# Patient Record
Sex: Female | Born: 1955
Health system: Southern US, Community
[De-identification: ages and names within clinical notes are randomized; demographics above are authoritative.]

## PROBLEM LIST (undated history)

## (undated) DIAGNOSIS — E119 Type 2 diabetes mellitus without complications: Secondary | ICD-10-CM

## (undated) DIAGNOSIS — Z9289 Personal history of other medical treatment: Secondary | ICD-10-CM

## (undated) DIAGNOSIS — E785 Hyperlipidemia, unspecified: Secondary | ICD-10-CM

## (undated) HISTORY — DX: Personal history of other medical treatment: Z92.89

## (undated) HISTORY — PX: CATARACT EXTRACTION: SUR2

## (undated) HISTORY — DX: Hyperlipidemia, unspecified: E78.5

---

## 2008-05-10 ENCOUNTER — Emergency Department (HOSPITAL_COMMUNITY): Admission: EM | Admit: 2008-05-10 | Discharge: 2008-05-10 | Payer: Self-pay | Admitting: Emergency Medicine

## 2008-05-27 ENCOUNTER — Encounter: Admission: RE | Admit: 2008-05-27 | Discharge: 2008-06-17 | Payer: Self-pay | Admitting: Orthopaedic Surgery

## 2009-06-28 ENCOUNTER — Encounter: Admission: RE | Admit: 2009-06-28 | Discharge: 2009-07-21 | Payer: Self-pay | Admitting: Family Medicine

## 2010-12-04 ENCOUNTER — Emergency Department (HOSPITAL_COMMUNITY)

## 2010-12-04 ENCOUNTER — Emergency Department (HOSPITAL_COMMUNITY)
Admission: EM | Admit: 2010-12-04 | Discharge: 2010-12-04 | Disposition: A | Discharge status: Home / Self Care | Attending: Emergency Medicine | Admitting: Emergency Medicine

## 2010-12-04 DIAGNOSIS — W108XXA Fall (on) (from) other stairs and steps, initial encounter: Secondary | ICD-10-CM | POA: Insufficient documentation

## 2010-12-04 DIAGNOSIS — M25519 Pain in unspecified shoulder: Secondary | ICD-10-CM | POA: Insufficient documentation

## 2011-03-26 DIAGNOSIS — E785 Hyperlipidemia, unspecified: Secondary | ICD-10-CM | POA: Insufficient documentation

## 2011-04-17 LAB — HM PAP SMEAR

## 2011-04-17 LAB — HM MAMMOGRAPHY

## 2011-05-28 DIAGNOSIS — K5900 Constipation, unspecified: Secondary | ICD-10-CM | POA: Insufficient documentation

## 2011-07-06 DIAGNOSIS — G2581 Restless legs syndrome: Secondary | ICD-10-CM | POA: Insufficient documentation

## 2011-07-06 DIAGNOSIS — M8580 Other specified disorders of bone density and structure, unspecified site: Secondary | ICD-10-CM | POA: Insufficient documentation

## 2011-09-26 DIAGNOSIS — R7989 Other specified abnormal findings of blood chemistry: Secondary | ICD-10-CM | POA: Insufficient documentation

## 2011-10-03 DIAGNOSIS — M069 Rheumatoid arthritis, unspecified: Secondary | ICD-10-CM | POA: Insufficient documentation

## 2011-10-03 DIAGNOSIS — D638 Anemia in other chronic diseases classified elsewhere: Secondary | ICD-10-CM | POA: Insufficient documentation

## 2011-12-21 ENCOUNTER — Telehealth: Payer: Self-pay | Admitting: Internal Medicine

## 2011-12-21 NOTE — Telephone Encounter (Signed)
S/W Pt. In regards to NP appt. On 9/17 @ 1:30 Ref. Lorenza Burton, PA DX Anemia Mailed NP packet

## 2012-01-01 ENCOUNTER — Other Ambulatory Visit (HOSPITAL_BASED_OUTPATIENT_CLINIC_OR_DEPARTMENT_OTHER): Admitting: Lab

## 2012-01-01 ENCOUNTER — Encounter: Payer: Self-pay | Admitting: Internal Medicine

## 2012-01-01 ENCOUNTER — Ambulatory Visit

## 2012-01-01 ENCOUNTER — Ambulatory Visit (HOSPITAL_BASED_OUTPATIENT_CLINIC_OR_DEPARTMENT_OTHER): Admitting: Internal Medicine

## 2012-01-01 VITALS — BP 111/68 | HR 96 | Temp 97.9°F | Resp 20 | Ht 63.5 in | Wt 133.2 lb

## 2012-01-01 DIAGNOSIS — D649 Anemia, unspecified: Secondary | ICD-10-CM

## 2012-01-01 DIAGNOSIS — G2581 Restless legs syndrome: Secondary | ICD-10-CM

## 2012-01-01 DIAGNOSIS — D539 Nutritional anemia, unspecified: Secondary | ICD-10-CM

## 2012-01-01 DIAGNOSIS — M810 Age-related osteoporosis without current pathological fracture: Secondary | ICD-10-CM

## 2012-01-01 LAB — COMPREHENSIVE METABOLIC PANEL (CC13)
AST: 21 U/L (ref 5–34)
Albumin: 4.5 g/dL (ref 3.5–5.0)
Alkaline Phosphatase: 88 U/L (ref 40–150)
Potassium: 4 mEq/L (ref 3.5–5.1)
Sodium: 142 mEq/L (ref 136–145)
Total Bilirubin: 0.7 mg/dL (ref 0.20–1.20)
Total Protein: 7.5 g/dL (ref 6.4–8.3)

## 2012-01-01 LAB — CBC & DIFF AND RETIC
BASO%: 0.7 % (ref 0.0–2.0)
Eosinophils Absolute: 0.1 10*3/uL (ref 0.0–0.5)
HCT: 42 % (ref 34.8–46.6)
LYMPH%: 24.7 % (ref 14.0–49.7)
MCHC: 32.9 g/dL (ref 31.5–36.0)
MCV: 89 fL (ref 79.5–101.0)
MONO#: 0.5 10*3/uL (ref 0.1–0.9)
MONO%: 7 % (ref 0.0–14.0)
NEUT%: 66.3 % (ref 38.4–76.8)
Platelets: 219 10*3/uL (ref 145–400)
RBC: 4.72 10*6/uL (ref 3.70–5.45)
Retic %: 1.19 % (ref 0.70–2.10)
WBC: 7 10*3/uL (ref 3.9–10.3)

## 2012-01-01 NOTE — Progress Notes (Signed)
Checked in new pt with no financial concerns. °

## 2012-01-01 NOTE — Progress Notes (Signed)
[facility] [phone]   [phone]  CONSULT NOTE  REASON FOR CONSULTATION:  56 years old white female with anemia  HPI Donna Moran is a 56 y.o. female was past medical history significant for diabetes mellitus, GERD, dyslipidemia, osteoporosis, restless leg syndrome. The patient was seen by her primary care physician recently and blood work on 09/26/2011 showed hemoglobin of 11.9 and hematocrit 37.0%. The patient was referred to me today for further evaluation and recommendation regarding her anemia. She has a history of hemorrhoids several years ago. The patient had colonoscopy performed in March of 2013 and according to her was normal. She denied having any bleeding issues, bruises or ecchymosis. She denied having any significant fatigue or weakness but has occasional dizzy spells. Her main complaint today was the diabetic peripheral neuropathy and she is currently on tramadol and Neurontin. She has no significant weight loss or night sweats. She has no chest pain, shortness breath, cough or hemoptysis.  She is not currently on any nutritional supplements.  Past medical history: Diabetes mellitus, hemorrhoids, GERD, dyslipidemia, osteoporosis and restless leg syndrome. The patient denied having any history of coronary artery disease or stroke.  Family history:  significant for a mother who died from heart disease and father died in an accident.   Social history: The patient is married and has 2 daughters. She is originally from [place] and has been in the [place] for the last 20 years. She has a history of smoking more than one pack per day for over 34 years but quit 8 years ago. No history of alcohol or drug abuse.   Allergies  Allergen Reactions  . Aspirin Nausea Only    Can take coated aspirin    Current Outpatient Prescriptions  Medication Sig Dispense Refill  . alendronate (FOSAMAX) 70 MG tablet Take 70 mg by mouth every 7  (seven) days. Take with a full glass of water on an empty stomach.      Marland Kitchen atorvastatin (LIPITOR) 20 MG tablet Take 20 mg by mouth daily.      Marland Kitchen gabapentin (NEURONTIN) 300 MG capsule Take 300 mg by mouth 3 (three) times daily. Take 2 capsules tid      . glipiZIDE (GLUCOTROL) 10 MG tablet Take 10 mg by mouth 2 (two) times daily before a meal.      . metFORMIN (GLUCOPHAGE) 850 MG tablet Take 850 mg by mouth 2 (two) times daily with a meal.      . omeprazole (PRILOSEC) 40 MG capsule Take 40 mg by mouth daily.      . traMADol (ULTRAM) 50 MG tablet Take 50 mg by mouth every 6 (six) hours as needed.        Review of Systems  A comprehensive review of systems was negative.  Physical Exam  [id number], healthy, no distress, well nourished and well developed SKIN: skin color, texture, turgor are normal HEAD: Normocephalic, No masses, lesions, tenderness or abnormalities EYES: normal EARS: External ears normal OROPHARYNX:no exudate and no erythema  NECK: supple, no adenopathy LYMPH:  no palpable lymphadenopathy, no hepatosplenomegaly BREAST:not examined LUNGS: clear to auscultation  HEART: regular rate & rhythm, no murmurs and no gallops ABDOMEN:abdomen soft, non-tender, normal bowel sounds and no masses or organomegaly BACK: Back symmetric, no curvature. EXTREMITIES:no edema, no skin discoloration, no clubbing, no cyanosis  NEURO: alert & oriented x 3 with fluent speech, no focal motor/sensory deficits  PERFORMANCE STATUS: ECOG 1  LABORATORY DATA: Lab Results  Component  Value Date   WBC 7.0 01/01/2012   HGB 13.8 01/01/2012   HCT 42.0 01/01/2012   MCV 89.0 01/01/2012   PLT 219 01/01/2012      Chemistry      Component Value Date/Time   NA 142 01/01/2012 1336   K 4.0 01/01/2012 1336   CL 104 01/01/2012 1336   CO2 28 01/01/2012 1336   BUN 12.0 01/01/2012 1336   CREATININE 0.8 01/01/2012 1336      Component Value Date/Time   CALCIUM 9.9 01/01/2012 1336   ALKPHOS 88 01/01/2012 1336   AST 21  01/01/2012 1336   ALT 30 01/01/2012 1336   BILITOT 0.70 01/01/2012 1336       RADIOGRAPHIC STUDIES: No results found.  ASSESSMENT: This is a very pleasant 56 years old white female with history of anemia most likely of iron deficiency which completely resolved at this point and the patient has normal hemoglobin and hematocrit.   PLAN: I discussed the lab result with the patient and recommended for her to continue her routine observation with her primary care physician. I ordered several studies today to rule out any other etiology for her anemia including repeat iron study, ferritin, serum folate, vitamin B12, serum erythropoietin and serum protein electrophoresis. If there is any significant abnormalities in this blood work I will contact the patient with my recommendation, otherwise I don't see any need for this patient to continue routine followup visit with me at this point. I would be happy to see her in the future if needed. I gave the patient the time to ask questions and I answered them completely to her satisfaction.  All questions were answered. The patient knows to call the clinic with any problems, questions or concerns. We can certainly see the patient much sooner if necessary.  Thank you so much for allowing me to participate in the care of Donna Moran. I will continue to follow up the patient with you and assist in her care.  I spent 30 minutes counseling the patient face to face. The total time spent in the appointment was 90minutes.  [name]. 01/01/2012, 2:39 PM

## 2012-01-03 LAB — SPEP & IFE WITH QIG
Alpha-2-Globulin: 10.3 % (ref 7.1–11.8)
Gamma Globulin: 11.5 % (ref 11.1–18.8)
IgA: 291 mg/dL (ref 69–380)
IgG (Immunoglobin G), Serum: 970 mg/dL (ref 690–1700)
IgM, Serum: 128 mg/dL (ref 52–322)
Total Protein, Serum Electrophoresis: 7.3 g/dL (ref 6.0–8.3)

## 2012-01-03 LAB — IRON AND TIBC
%SAT: 38 % (ref 20–55)
Iron: 110 ug/dL (ref 42–145)

## 2012-01-03 LAB — VITAMIN B12: Vitamin B-12: 415 pg/mL (ref 211–911)

## 2012-01-03 LAB — ERYTHROPOIETIN: Erythropoietin: 3.2 m[IU]/mL (ref 2.6–34.0)

## 2012-04-23 ENCOUNTER — Emergency Department (HOSPITAL_COMMUNITY)
Admission: EM | Admit: 2012-04-23 | Discharge: 2012-04-23 | Disposition: A | Discharge status: Home / Self Care | Attending: Emergency Medicine | Admitting: Emergency Medicine

## 2012-04-23 ENCOUNTER — Encounter (HOSPITAL_COMMUNITY): Payer: Self-pay | Admitting: *Deleted

## 2012-04-23 DIAGNOSIS — K029 Dental caries, unspecified: Secondary | ICD-10-CM

## 2012-04-23 DIAGNOSIS — Z79899 Other long term (current) drug therapy: Secondary | ICD-10-CM | POA: Insufficient documentation

## 2012-04-23 DIAGNOSIS — E119 Type 2 diabetes mellitus without complications: Secondary | ICD-10-CM | POA: Insufficient documentation

## 2012-04-23 HISTORY — DX: Type 2 diabetes mellitus without complications: E11.9

## 2012-04-23 MED ORDER — HYDROCODONE-ACETAMINOPHEN 5-325 MG PO TABS: 1.0000 | ORAL_TABLET | Freq: Four times a day (QID) | ORAL | Status: DC | PRN

## 2012-04-23 MED ORDER — PENICILLIN V POTASSIUM 500 MG PO TABS: 500.0000 mg | ORAL_TABLET | Freq: Four times a day (QID) | ORAL | Status: DC

## 2012-04-23 NOTE — ED Notes (Signed)
Pt c/o pain to left side of mouth. States "I have bad teeth I fixed them 30 years ago and now they are falling apart."

## 2012-04-23 NOTE — ED Provider Notes (Signed)
History     CSN: [id number]  Arrival date & time 04/23/12  1209   First MD Initiated Contact with Patient 04/23/12 1217      Chief Complaint  Patient presents with  . Dental Pain    (Consider location/radiation/quality/duration/timing/severity/associated sxs/prior treatment) HPI The patient presents to the emergency department with with dental pain on the left side.  Patient states that her bottom and top teeth all hurt.  Patient states that she has severe dental decay in damage.  Patient denies fever, nausea, vomiting, neck pain, difficulty breathing, difficulty swallowing, or headache.  Patient, states she's seen a dentist in the past.  He says she needs to have her teeth pulled. Past Medical History  Diagnosis Date  . Diabetes mellitus without complication     History reviewed. No pertinent past surgical history.  History reviewed. No pertinent family history.  History  Substance Use Topics  . Smoking status: Not on file  . Smokeless tobacco: Not on file  . Alcohol Use: No    OB History    Grav Para Term Preterm Abortions TAB SAB Ect Mult Living                  Review of Systems All other systems negative except as documented in the HPI. All pertinent positives and negatives as reviewed in the HPI. Allergies  Aspirin  Home Medications   Current Outpatient Rx  Name  Route  Sig  Dispense  Refill  . ALENDRONATE SODIUM 70 MG PO TABS   Oral   Take 70 mg by mouth every 7 (seven) days. Take with a full glass of water on an empty stomach.         . ATORVASTATIN CALCIUM 20 MG PO TABS   Oral   Take 20 mg by mouth daily.         Marland Kitchen GABAPENTIN 300 MG PO CAPS   Oral   Take 300 mg by mouth 3 (three) times daily. Take 2 capsules tid         . GLIPIZIDE 10 MG PO TABS   Oral   Take 10 mg by mouth 2 (two) times daily before a meal.         . METFORMIN HCL 850 MG PO TABS   Oral   Take 850 mg by mouth daily.          Marland Kitchen OMEPRAZOLE 40 MG PO CPDR   Oral  Take 40 mg by mouth daily.         . TRAMADOL HCL 50 MG PO TABS   Oral   Take 50 mg by mouth every 6 (six) hours as needed.           BP 131/81  Pulse 80  Temp 98.6 F (37 C) (Oral)  Resp 18  SpO2 97%  Physical Exam  Nursing note and vitals reviewed. Constitutional: She appears well-developed and well-nourished. No distress.  HENT:  Head: Normocephalic and atraumatic. No trismus in the jaw.  Mouth/Throat: Abnormal dentition. Dental caries present. No dental abscesses or uvula swelling.  Neck: Normal range of motion. Neck supple.  Cardiovascular: Normal rate, regular rhythm and normal heart sounds.   Pulmonary/Chest: Effort normal and breath sounds normal.    ED Course  Procedures (including critical care time)  Patient will be referred to dentistry.  Told to return here as needed  [place], PA-C 04/23/12 1231

## 2012-04-25 NOTE — ED Provider Notes (Signed)
Medical screening examination/treatment/procedure(s) were performed by non-physician practitioner and as supervising physician I was immediately available for consultation/collaboration.    Dot Lanes, MD 04/25/12 1052

## 2012-06-25 DIAGNOSIS — R0681 Apnea, not elsewhere classified: Secondary | ICD-10-CM | POA: Insufficient documentation

## 2012-10-07 ENCOUNTER — Ambulatory Visit (INDEPENDENT_AMBULATORY_CARE_PROVIDER_SITE_OTHER): Admitting: Internal Medicine

## 2012-10-07 ENCOUNTER — Other Ambulatory Visit (INDEPENDENT_AMBULATORY_CARE_PROVIDER_SITE_OTHER)

## 2012-10-07 ENCOUNTER — Encounter: Payer: Self-pay | Admitting: *Deleted

## 2012-10-07 VITALS — BP 112/82 | HR 85 | Temp 98.8°F | Ht 64.0 in | Wt 140.0 lb

## 2012-10-07 DIAGNOSIS — E1110 Type 2 diabetes mellitus with ketoacidosis without coma: Secondary | ICD-10-CM

## 2012-10-07 DIAGNOSIS — Z130 Encounter for screening for diseases of the blood and blood-forming organs and certain disorders involving the immune mechanism: Secondary | ICD-10-CM

## 2012-10-07 DIAGNOSIS — M129 Arthropathy, unspecified: Secondary | ICD-10-CM

## 2012-10-07 DIAGNOSIS — E782 Mixed hyperlipidemia: Secondary | ICD-10-CM | POA: Insufficient documentation

## 2012-10-07 DIAGNOSIS — Z23 Encounter for immunization: Secondary | ICD-10-CM

## 2012-10-07 DIAGNOSIS — Z0000 Encounter for general adult medical examination without abnormal findings: Secondary | ICD-10-CM

## 2012-10-07 DIAGNOSIS — E1310 Other specified diabetes mellitus with ketoacidosis without coma: Secondary | ICD-10-CM

## 2012-10-07 DIAGNOSIS — F411 Generalized anxiety disorder: Secondary | ICD-10-CM

## 2012-10-07 DIAGNOSIS — Z1329 Encounter for screening for other suspected endocrine disorder: Secondary | ICD-10-CM

## 2012-10-07 DIAGNOSIS — E785 Hyperlipidemia, unspecified: Secondary | ICD-10-CM

## 2012-10-07 DIAGNOSIS — D649 Anemia, unspecified: Secondary | ICD-10-CM

## 2012-10-07 DIAGNOSIS — M1990 Unspecified osteoarthritis, unspecified site: Secondary | ICD-10-CM

## 2012-10-07 LAB — BASIC METABOLIC PANEL
BUN: 13 mg/dL (ref 6–23)
CO2: 31 mEq/L (ref 19–32)
Glucose, Bld: 139 mg/dL — ABNORMAL HIGH (ref 70–99)
Potassium: 4.7 mEq/L (ref 3.5–5.1)
Sodium: 141 mEq/L (ref 135–145)

## 2012-10-07 LAB — CBC
MCV: 88.8 fl (ref 78.0–100.0)
Platelets: 203 10*3/uL (ref 150.0–400.0)
RBC: 3.96 Mil/uL (ref 3.87–5.11)
WBC: 8.8 10*3/uL (ref 4.5–10.5)

## 2012-10-07 LAB — HEMOGLOBIN A1C: Hgb A1c MFr Bld: 8.3 % — ABNORMAL HIGH (ref 4.6–6.5)

## 2012-10-07 LAB — LIPID PANEL
HDL: 86.4 mg/dL (ref >39.00)
LDL Cholesterol: 78 mg/dL (ref 0–99)
Total CHOL/HDL Ratio: 2
VLDL: 8.6 mg/dL (ref 0.0–40.0)

## 2012-10-07 LAB — TSH: TSH: 0.67 u[IU]/mL (ref 0.35–5.50)

## 2012-10-07 MED ORDER — TRAMADOL HCL 50 MG PO TABS: 100.0000 mg | ORAL_TABLET | Freq: Four times a day (QID) | ORAL | Status: DC | PRN

## 2012-10-07 MED ORDER — FLUOXETINE HCL 20 MG PO TABS: 20.0000 mg | ORAL_TABLET | Freq: Every day | ORAL | Status: DC

## 2012-10-07 NOTE — Assessment & Plan Note (Signed)
Will check lipid profile today.  

## 2012-10-07 NOTE — Progress Notes (Signed)
HPI  Pt presents to the clinic today to establish care. She is transferring care but she can't remember the name of the practice. She does need refills of her tramadol. Additionally, she has concerns about anxiety and stress. She reports that her husband and children are constantly testing her nerves and she gets very aggravated with them. This happens on a daily basis. This is an every day thing. She has not had any treatment for this in the past.  Flu: never Tetanus: more than 10 years ago LMP: post menopausal Pap smear: 2013 Mammogram: 2013 Pneumovax: unsure Colonoscopy: unsure Eye doctor: as needed Dentist: as needed  Past Medical History  Diagnosis Date  . Diabetes mellitus without complication   . Hyperlipidemia   . History of blood transfusion     Current Outpatient Prescriptions  Medication Sig Dispense Refill  . atorvastatin (LIPITOR) 20 MG tablet Take 20 mg by mouth daily.      Marland Kitchen gabapentin (NEURONTIN) 300 MG capsule Take 600 mg by mouth 3 (three) times daily.       Marland Kitchen glipiZIDE (GLUCOTROL) 10 MG tablet Take 10 mg by mouth 2 (two) times daily before a meal.      . metFORMIN (GLUCOPHAGE) 850 MG tablet Take 850 mg by mouth daily.       Marland Kitchen omeprazole (PRILOSEC) 40 MG capsule Take 40 mg by mouth daily.      . traMADol (ULTRAM) 50 MG tablet Take 100 mg by mouth every 6 (six) hours as needed.       Marland Kitchen alendronate (FOSAMAX) 70 MG tablet Take 70 mg by mouth every 7 (seven) days. Take with a full glass of water on an empty stomach.       No current facility-administered medications for this visit.    Allergies  Allergen Reactions  . Aspirin Nausea Only    Can take coated aspirin    Family History  Problem Relation Age of Onset  . Cancer Neg Hx   . Diabetes Neg Hx   . Stroke Neg Hx     History   Social History  . Marital Status: Married    Spouse Name: N/A    Number of Children: 2  . Years of Education: N/A   Occupational History  . Unemployed    Social History  Main Topics  . Smoking status: Never Smoker   . Smokeless tobacco: Never Used  . Alcohol Use: No  . Drug Use: No  . Sexually Active: Yes   Other Topics Concern  . Not on file   Social History Narrative   Regular exercise-no   Caffeine Use-yes    ROS:  Constitutional: Denies fever, malaise, fatigue, headache or abrupt weight changes.  HEENT: Denies eye pain, eye redness, ear pain, ringing in the ears, wax buildup, runny nose, nasal congestion, bloody nose, or sore throat. Respiratory: Denies difficulty breathing, shortness of breath, cough or sputum production.   Cardiovascular: Denies chest pain, chest tightness, palpitations or swelling in the hands or feet.  Gastrointestinal: Denies abdominal pain, bloating, constipation, diarrhea or blood in the stool.  GU: Denies frequency, urgency, pain with urination, blood in urine, odor or discharge. Musculoskeletal: Pt reports knee pain. Denies decrease in range of motion, difficulty with gait, muscle pain or joint swelling.  Skin: Denies redness, rashes, lesions or ulcercations.  Neurological: Pt reports anxiety. Denies dizziness, difficulty with memory, difficulty with speech or problems with balance and coordination.   No other specific complaints in a complete review of  systems (except as listed in HPI above).  PE:  BP 112/82  Pulse 85  Temp(Src) 98.8 F (37.1 C) (Oral)  Ht 5\' 4"  (1.626 m)  Wt 140 lb (63.504 kg)  BMI 24.02 kg/m2  SpO2 97% Wt Readings from Last 3 Encounters:  10/07/12 140 lb (63.504 kg)  01/01/12 133 lb 3.2 oz (60.419 kg)    General: Appears her stated age, well developed, well nourished in NAD. HEENT: Head: normal shape and size; Eyes: sclera white, no icterus, conjunctiva pink, PERRLA and EOMs intact; Ears: Tm's gray and intact, normal light reflex; Nose: mucosa pink and moist, septum midline; Throat/Mouth: Teeth present, mucosa pink and moist, no lesions or ulcerations noted.  Neck: Normal range of motion.  Neck supple, trachea midline. No massses, lumps or thyromegaly present.  Cardiovascular: Normal rate and rhythm. S1,S2 noted.  No murmur, rubs or gallops noted. No JVD or BLE edema. No carotid bruits noted. Pulmonary/Chest: Normal effort and positive vesicular breath sounds. No respiratory distress. No wheezes, rales or ronchi noted.  Abdomen: Soft and nontender. Normal bowel sounds, no bruits noted. No distention or masses noted. Liver, spleen and kidneys non palpable. Musculoskeletal: Normal range of motion. No signs of joint swelling. No difficulty with gait.  Neurological: Alert and oriented. Cranial nerves II-XII intact. Coordination normal. +DTRs bilaterally. Psychiatric: Mood anxious and affect normal. Behavior is normal. Judgment and thought content normal.      Assessment and Plan:  Preventative Health Maintenance:  Will give tdap today Encouraged pt to see eye doctor and dentist yearly

## 2012-10-07 NOTE — Assessment & Plan Note (Signed)
Refilled tramadol today.

## 2012-10-07 NOTE — Addendum Note (Signed)
Addended by: Legrand Como on: 10/07/2012 04:51 PM   Modules accepted: Orders

## 2012-10-07 NOTE — Assessment & Plan Note (Signed)
Sugars labile Will check A1C today Continue to work on diet and exercise

## 2012-10-07 NOTE — Patient Instructions (Addendum)

## 2012-10-07 NOTE — Assessment & Plan Note (Signed)
Support given Will start Prozac 20mg  daily  RTC in 1 month to f/u

## 2012-10-07 NOTE — Assessment & Plan Note (Signed)
Will check CBC today.  

## 2012-10-08 ENCOUNTER — Other Ambulatory Visit: Payer: Self-pay | Admitting: *Deleted

## 2012-10-08 MED ORDER — METFORMIN HCL 1000 MG PO TABS: 1000.0000 mg | ORAL_TABLET | Freq: Two times a day (BID) | ORAL | Status: DC

## 2012-10-08 NOTE — Telephone Encounter (Signed)
Rx for Metformin 1000mg  BID sent to [facility] to replace 850mg  qd. Pt informed of this change via results in result note.

## 2012-10-29 ENCOUNTER — Telehealth: Payer: Self-pay | Admitting: Internal Medicine

## 2012-10-29 NOTE — Telephone Encounter (Signed)
If she would like we can put her back down to 500 mg BID and  I can add Januvia. How does she feel about this.

## 2012-10-29 NOTE — Telephone Encounter (Signed)
Patient said that her metformin was moved up to 1000mg  twice a day and she said she is having side effects from the increase.  Patient states she is having nausea, stomach craps, and diarrhea.  Please give patient a call back regarding this.  Thanks!

## 2012-10-30 ENCOUNTER — Other Ambulatory Visit: Payer: Self-pay | Admitting: Internal Medicine

## 2012-10-30 MED ORDER — SITAGLIPTIN PHOSPHATE 100 MG PO TABS: 100.0000 mg | ORAL_TABLET | Freq: Every day | ORAL | Status: DC

## 2012-10-30 MED ORDER — METFORMIN HCL 1000 MG PO TABS: 500.0000 mg | ORAL_TABLET | Freq: Two times a day (BID) | ORAL | Status: DC

## 2012-10-30 MED ORDER — GABAPENTIN 300 MG PO CAPS: 600.0000 mg | ORAL_CAPSULE | Freq: Three times a day (TID) | ORAL | Status: DC

## 2012-10-30 NOTE — Telephone Encounter (Signed)
Left message for pt to callback office.  

## 2012-10-30 NOTE — Telephone Encounter (Signed)
RX called in .

## 2012-10-30 NOTE — Telephone Encounter (Signed)
Pt is okay with going back to Metformin 500mg  bid and starting Januvia.

## 2012-11-29 ENCOUNTER — Other Ambulatory Visit: Payer: Self-pay | Admitting: Internal Medicine

## 2013-01-26 ENCOUNTER — Telehealth: Payer: Self-pay | Admitting: Internal Medicine

## 2013-01-26 NOTE — Telephone Encounter (Signed)
[facility]  She will call back with the phone number or location.

## 2013-01-26 NOTE — Telephone Encounter (Signed)
Pt called back. Pharmacy phone number is [phone].  [facility] [address] [facility] [place]

## 2013-01-28 ENCOUNTER — Other Ambulatory Visit: Payer: Self-pay | Admitting: Internal Medicine

## 2013-01-28 NOTE — Telephone Encounter (Signed)
Changed pharmacy to [facility] in [place] per patient...ds,cma

## 2013-01-29 ENCOUNTER — Telehealth: Payer: Self-pay

## 2013-01-29 MED ORDER — TRAMADOL HCL 50 MG PO TABS: ORAL_TABLET | ORAL | Status: DC

## 2013-01-29 NOTE — Telephone Encounter (Signed)
[facility].../lmb 

## 2013-01-29 NOTE — Telephone Encounter (Signed)
Ok done

## 2013-01-29 NOTE — Telephone Encounter (Signed)
Refill request for Tramadol  Can you please advise for [facility].

## 2013-03-04 ENCOUNTER — Other Ambulatory Visit: Payer: Self-pay | Admitting: Internal Medicine

## 2013-03-04 ENCOUNTER — Telehealth: Payer: Self-pay | Admitting: Internal Medicine

## 2013-03-04 DIAGNOSIS — F411 Generalized anxiety disorder: Secondary | ICD-10-CM

## 2013-03-04 MED ORDER — OMEPRAZOLE 40 MG PO CPDR: 40.0000 mg | DELAYED_RELEASE_CAPSULE | Freq: Every day | ORAL | Status: DC

## 2013-03-04 MED ORDER — SITAGLIPTIN PHOSPHATE 100 MG PO TABS: ORAL_TABLET | ORAL | Status: DC

## 2013-03-04 MED ORDER — GLIPIZIDE 10 MG PO TABS: 10.0000 mg | ORAL_TABLET | Freq: Two times a day (BID) | ORAL | Status: DC

## 2013-03-04 MED ORDER — FLUOXETINE HCL 20 MG PO TABS: 20.0000 mg | ORAL_TABLET | Freq: Every day | ORAL | Status: DC

## 2013-03-04 NOTE — Telephone Encounter (Signed)
Pt request refill for: omeprazole, fluoxetine, januvia and glipizide. [facility] [place]

## 2013-03-16 ENCOUNTER — Other Ambulatory Visit: Payer: Self-pay | Admitting: Internal Medicine

## 2013-03-19 NOTE — Telephone Encounter (Signed)
Called [facility] [place].

## 2013-03-19 NOTE — Telephone Encounter (Signed)
Pt called again for refill.  Tramadol.  She has 2 pills left.

## 2013-03-19 NOTE — Telephone Encounter (Signed)
Spoke with Izora Gala at [place] office.  She states the patient has been calling to get this medicine refilled.  Is there anyone here at [facility] that is covering R.Baity's patient's while she is out?   Can this med be refilled/printed for the pt?

## 2013-03-19 NOTE — Telephone Encounter (Signed)
Pt has been trying to get tramadol refilled since 03/16/13 [facility] [place]; Webb Silversmith NP is out of office and pt is out of med today. Pt request med be refilled today; pt does plan to come to [facility] to continue seeing Webb Silversmith NP.Please advise. Pt request cb when called in.

## 2013-03-19 NOTE — Telephone Encounter (Signed)
Donna Moran notified prescription has been called to her pharmacy.

## 2013-04-28 ENCOUNTER — Other Ambulatory Visit: Payer: Self-pay | Admitting: Internal Medicine

## 2013-04-28 NOTE — Telephone Encounter (Signed)
Last filled 10/30/2012 with 5 refills--no upcoming appts--please advise

## 2013-05-02 ENCOUNTER — Other Ambulatory Visit: Payer: Self-pay | Admitting: Internal Medicine

## 2013-05-04 NOTE — Telephone Encounter (Signed)
Pt called back pt had cataract surgery and pt cannot drive for at least one week. Pt needs tramadol for diabetic nerve pain in feet. Pt request refill Tramadol until can come in for appt.Please advise.Pt request cb.

## 2013-05-04 NOTE — Telephone Encounter (Signed)
Rx called into pharmacy and pt is aware. 

## 2013-05-04 NOTE — Telephone Encounter (Signed)
Last filled 03/16/13--please advise

## 2013-05-04 NOTE — Telephone Encounter (Signed)
Ok given 30 day supply

## 2013-05-24 ENCOUNTER — Other Ambulatory Visit: Payer: Self-pay | Admitting: Internal Medicine

## 2013-05-25 ENCOUNTER — Other Ambulatory Visit: Payer: Self-pay | Admitting: Internal Medicine

## 2013-05-26 ENCOUNTER — Other Ambulatory Visit: Payer: Self-pay | Admitting: Internal Medicine

## 2013-06-21 ENCOUNTER — Other Ambulatory Visit: Payer: Self-pay | Admitting: Internal Medicine

## 2013-07-01 ENCOUNTER — Other Ambulatory Visit: Payer: Self-pay | Admitting: Internal Medicine

## 2013-07-01 ENCOUNTER — Ambulatory Visit (INDEPENDENT_AMBULATORY_CARE_PROVIDER_SITE_OTHER): Admitting: Internal Medicine

## 2013-07-01 ENCOUNTER — Encounter: Payer: Self-pay | Admitting: Internal Medicine

## 2013-07-01 VITALS — BP 114/70 | HR 73 | Temp 98.4°F | Wt 135.0 lb

## 2013-07-01 DIAGNOSIS — E785 Hyperlipidemia, unspecified: Secondary | ICD-10-CM

## 2013-07-01 DIAGNOSIS — F411 Generalized anxiety disorder: Secondary | ICD-10-CM

## 2013-07-01 DIAGNOSIS — M129 Arthropathy, unspecified: Secondary | ICD-10-CM

## 2013-07-01 DIAGNOSIS — E119 Type 2 diabetes mellitus without complications: Secondary | ICD-10-CM | POA: Insufficient documentation

## 2013-07-01 DIAGNOSIS — M810 Age-related osteoporosis without current pathological fracture: Secondary | ICD-10-CM

## 2013-07-01 DIAGNOSIS — M1990 Unspecified osteoarthritis, unspecified site: Secondary | ICD-10-CM

## 2013-07-01 MED ORDER — GLIPIZIDE 10 MG PO TABS: 10.0000 mg | ORAL_TABLET | Freq: Two times a day (BID) | ORAL | Status: DC

## 2013-07-01 MED ORDER — FLUOXETINE HCL 20 MG PO TABS: 20.0000 mg | ORAL_TABLET | Freq: Every day | ORAL | Status: DC

## 2013-07-01 MED ORDER — SITAGLIPTIN PHOSPHATE 100 MG PO TABS: 100.0000 mg | ORAL_TABLET | Freq: Every day | ORAL | Status: DC

## 2013-07-01 MED ORDER — OMEPRAZOLE 40 MG PO CPDR: 40.0000 mg | DELAYED_RELEASE_CAPSULE | Freq: Every day | ORAL | Status: DC

## 2013-07-01 MED ORDER — ATORVASTATIN CALCIUM 20 MG PO TABS: 20.0000 mg | ORAL_TABLET | Freq: Every day | ORAL | Status: DC

## 2013-07-01 MED ORDER — METFORMIN HCL 1000 MG PO TABS: 500.0000 mg | ORAL_TABLET | Freq: Two times a day (BID) | ORAL | Status: DC

## 2013-07-01 NOTE — Assessment & Plan Note (Signed)
Well controlled on gabapentin and tramadol, continue at this time

## 2013-07-01 NOTE — Assessment & Plan Note (Signed)
On fosomax in the past Will recheck Vit D today

## 2013-07-01 NOTE — Progress Notes (Signed)
Pre visit review using our clinic review tool, if applicable. No additional management support is needed unless otherwise documented below in the visit note. 

## 2013-07-01 NOTE — Assessment & Plan Note (Signed)
Well controlled on lipitor Will recheck lipid profile today

## 2013-07-01 NOTE — Progress Notes (Signed)
Subjective:    Patient ID: Donna Moran, female    DOB: 12-05-1955, 58 y.o.   MRN: [id number]  HPI  Pt presents to the clinic today for 6 month follow up of chronic medical conditions.  DM2: Last A1C 8.3% 09/2012. Metformin was increased to 1000 mg BID. She is also on glipizide and [place]. Blood sugars range from 47-206.  Arthritis: Takes gabapentin tramadol prn for pain. Well controlled at this time.  Anxiety: She stops the Prozac 1 month ago. Since that time, she has felt jittery and nervous.Marland Kitchen  HLD: Lipid profile 09/2012 reviewed. Cholesterol well controlled on lipitor. 20 mg daily. LFT's normal.  GERD: No issues on prilosec.   She does reques medication refills today.  Review of Systems      Past Medical History  Diagnosis Date  . Diabetes mellitus without complication   . Hyperlipidemia   . History of blood transfusion     Current Outpatient Prescriptions  Medication Sig Dispense Refill  . alendronate (FOSAMAX) 70 MG tablet Take 70 mg by mouth every 7 (seven) days. Take with a full glass of water on an empty stomach.      Marland Kitchen atorvastatin (LIPITOR) 20 MG tablet Take 20 mg by mouth daily.      Marland Kitchen FLUoxetine (PROZAC) 20 MG tablet Take 1 tablet (20 mg total) by mouth daily.  30 tablet  3  . gabapentin (NEURONTIN) 300 MG capsule TAKE 2 CAPSULES (600 MG TOTAL) BY MOUTH 3 (THREE) TIMES DAILY.  180 capsule  3  . glipiZIDE (GLUCOTROL) 10 MG tablet Take 1 tablet (10 mg total) by mouth 2 (two) times daily before a meal.  60 tablet  0  . glucose blood (FREESTYLE LITE) test strip Check blood sugar 1-2 times daily as needed--1 Box  1 each  0  . metFORMIN (GLUCOPHAGE) 1000 MG tablet Take 0.5 tablets (500 mg total) by mouth 2 (two) times daily with a meal.  60 tablet  4  . omeprazole (PRILOSEC) 40 MG capsule Take 1 capsule (40 mg total) by mouth daily.  30 capsule  0  . sitaGLIPtin (JANUVIA) 100 MG tablet Take 1 tablet (100 mg total) by mouth daily.  30 tablet  0   No current  facility-administered medications for this visit.    Allergies  Allergen Reactions  . Aspirin Nausea Only    Can take coated aspirin    Family History  Problem Relation Age of Onset  . Cancer Neg Hx   . Diabetes Neg Hx   . Stroke Neg Hx     History   Social History  . Marital Status: Married    Spouse Name: N/A    Number of Children: 2  . Years of Education: N/A   Occupational History  . Unemployed    Social History Main Topics  . Smoking status: Former Research scientist (life sciences)  . Smokeless tobacco: Never Used  . Alcohol Use: No  . Drug Use: No  . Sexual Activity: Yes   Other Topics Concern  . Not on file   Social History Narrative   Regular exercise-no   Caffeine Use-yes     Constitutional: Denies fever, malaise, fatigue, headache or abrupt weight changes.  Respiratory: Denies difficulty breathing, shortness of breath, cough or sputum production.   Cardiovascular: Denies chest pain, chest tightness, palpitations or swelling in the hands or feet.  Gastrointestinal: Denies abdominal pain, bloating, constipation, diarrhea or blood in the stool.  Musculoskeletal: Denies decrease in range of motion, difficulty with gait,  muscle pain or joint pain and swelling.  Skin: Denies redness, rashes, lesions or ulcercations.  Neurological: Denies dizziness, difficulty with memory, difficulty with speech or problems with balance and coordination.   No other specific complaints in a complete review of systems (except as listed in HPI above).  Objective:   Physical Exam  BP 114/70  Pulse 73  Temp(Src) 98.4 F (36.9 C) (Oral)  Wt 135 lb (61.236 kg)  SpO2 99% Wt Readings from Last 3 Encounters:  07/01/13 135 lb (61.236 kg)  10/07/12 140 lb (63.504 kg)  01/01/12 133 lb 3.2 oz (60.419 kg)    General: Appears her stated age, well developed, well nourished in NAD. Skin: Warm, dry and intact. No rashes, lesions or ulcerations noted. HEENT: Head: normal shape and size; Eyes: sclera white,  no icterus, conjunctiva pink, PERRLA and EOMs intact; Ears: Tm's gray and intact, normal light reflex; Nose: mucosa pink and moist, septum midline; Throat/Mouth: Teeth missing, mucosa pink and moist, no exudate, lesions or ulcerations noted.  Neck: Normal range of motion. Neck supple, trachea midline. No massses, lumps or thyromegaly present.  Cardiovascular: Normal rate and rhythm. S1,S2 noted.  No murmur, rubs or gallops noted. No JVD or BLE edema. No carotid bruits noted. Pulmonary/Chest: Normal effort and positive vesicular breath sounds. No respiratory distress. No wheezes, rales or ronchi noted.  Abdomen: Soft and nontender. Normal bowel sounds, no bruits noted. No distention or masses noted. Liver, spleen and kidneys non palpable. Musculoskeletal: Normal range of motion. No signs of joint swelling. No difficulty with gait.  Neurological: Alert and oriented. Cranial nerves II-XII intact. Coordination normal. +DTRs bilaterally. Psychiatric: Mood and affect normal. Behavior is normal. Judgment and thought content normal.     BMET    Component Value Date/Time   NA 141 10/07/2012 1545   NA 142 01/01/2012 1336   K 4.7 10/07/2012 1545   K 4.0 01/01/2012 1336   CL 103 10/07/2012 1545   CL 104 01/01/2012 1336   CO2 31 10/07/2012 1545   CO2 28 01/01/2012 1336   GLUCOSE 139* 10/07/2012 1545   GLUCOSE 128* 01/01/2012 1336   BUN 13 10/07/2012 1545   BUN 12.0 01/01/2012 1336   CREATININE 0.7 10/07/2012 1545   CREATININE 0.8 01/01/2012 1336   CALCIUM 9.8 10/07/2012 1545   CALCIUM 9.9 01/01/2012 1336    Lipid Panel     Component Value Date/Time   CHOL 173 10/07/2012 1545   TRIG 43.0 10/07/2012 1545   HDL 86.40 10/07/2012 1545   CHOLHDL 2 10/07/2012 1545   VLDL 8.6 10/07/2012 1545   LDLCALC 78 10/07/2012 1545    CBC    Component Value Date/Time   WBC 8.8 10/07/2012 1545   WBC 7.0 01/01/2012 1336   RBC 3.96 10/07/2012 1545   RBC 4.72 01/01/2012 1336   HGB 11.8* 10/07/2012 1545   HGB 13.8 01/01/2012 1336     HCT 35.2* 10/07/2012 1545   HCT 42.0 01/01/2012 1336   PLT 203.0 10/07/2012 1545   PLT 219 01/01/2012 1336   MCV 88.8 10/07/2012 1545   MCV 89.0 01/01/2012 1336   MCH 29.2 01/01/2012 1336   MCHC 33.7 10/07/2012 1545   MCHC 32.9 01/01/2012 1336   RDW 13.0 10/07/2012 1545   RDW 12.2 01/01/2012 1336   LYMPHSABS 1.7 01/01/2012 1336   MONOABS 0.5 01/01/2012 1336   EOSABS 0.1 01/01/2012 1336   BASOSABS 0.1 01/01/2012 1336    Hgb A1C Lab Results  Component Value Date   HGBA1C  8.3* 10/07/2012         Assessment & Plan:

## 2013-07-01 NOTE — Assessment & Plan Note (Signed)
She did abruptly stop the prozac because she ran out of refills Will restart today RX provided

## 2013-07-01 NOTE — Assessment & Plan Note (Signed)
Last A1C 8.3 On metformin, glipizide and januvia Will check A1C and microalbumin today Foot exam today

## 2013-07-01 NOTE — Patient Instructions (Addendum)
Type 2 Diabetes Mellitus, Adult Type 2 diabetes mellitus, often simply referred to as type 2 diabetes, is a long-lasting (chronic) disease. In type 2 diabetes, the pancreas does not make enough insulin (a hormone), the cells are less responsive to the insulin that is made (insulin resistance), or both. Normally, insulin moves sugars from food into the tissue cells. The tissue cells use the sugars for energy. The lack of insulin or the lack of normal response to insulin causes excess sugars to build up in the blood instead of going into the tissue cells. As a result, high blood sugar (hyperglycemia) develops. The effect of high sugar (glucose) levels can cause many complications. Type 2 diabetes was also previously called adult-onset diabetes but it can occur at any age.  RISK FACTORS  A person is predisposed to developing type 2 diabetes if someone in the family has the disease and also has one or more of the following primary risk factors:  Overweight.  An inactive lifestyle.  A history of consistently eating high-calorie foods. Maintaining a normal weight and regular physical activity can reduce the chance of developing type 2 diabetes. SYMPTOMS  A person with type 2 diabetes may not show symptoms initially. The symptoms of type 2 diabetes appear slowly. The symptoms include:  Increased thirst (polydipsia).  Increased urination (polyuria).  Increased urination during the night (nocturia).  Weight loss. This weight loss may be rapid.  Frequent, recurring infections.  Tiredness (fatigue).  Weakness.  Vision changes, such as blurred vision.  Fruity smell to your breath.  Abdominal pain.  Nausea or vomiting.  Cuts or bruises which are slow to heal.  Tingling or numbness in the hands or feet. DIAGNOSIS Type 2 diabetes is frequently not diagnosed until complications of diabetes are present. Type 2 diabetes is diagnosed when symptoms or complications are present and when blood  glucose levels are increased. Your blood glucose level may be checked by one or more of the following blood tests:  A fasting blood glucose test. You will not be allowed to eat for at least 8 hours before a blood sample is taken.  A random blood glucose test. Your blood glucose is checked at any time of the day regardless of when you ate.  A hemoglobin A1c blood glucose test. A hemoglobin A1c test provides information about blood glucose control over the previous 3 months.  An oral glucose tolerance test (OGTT). Your blood glucose is measured after you have not eaten (fasted) for 2 hours and then after you drink a glucose-containing beverage. TREATMENT   You may need to take insulin or diabetes medicine daily to keep blood glucose levels in the desired range.  You will need to match insulin dosing with exercise and healthy food choices. The treatment goal is to maintain the before meal blood sugar (preprandial glucose) level at 70 130 mg/dL. HOME CARE INSTRUCTIONS   Have your hemoglobin A1c level checked twice a year.  Perform daily blood glucose monitoring as directed by your caregiver.  Monitor urine ketones when you are ill and as directed by your caregiver.  Take your diabetes medicine or insulin as directed by your caregiver to maintain your blood glucose levels in the desired range.  Never run out of diabetes medicine or insulin. It is needed every day.  Adjust insulin based on your intake of carbohydrates. Carbohydrates can raise blood glucose levels but need to be included in your diet. Carbohydrates provide vitamins, minerals, and fiber which are an essential part of   a healthy diet. Carbohydrates are found in fruits, vegetables, whole grains, dairy products, legumes, and foods containing added sugars.    Eat healthy foods. Alternate 3 meals with 3 snacks.  Lose weight if overweight.  Carry a medical alert card or wear your medical alert jewelry.  Carry a 15 gram  carbohydrate snack with you at all times to treat low blood glucose (hypoglycemia). Some examples of 15 gram carbohydrate snacks include:  Glucose tablets, 3 or 4   Glucose gel, 15 gram tube  Raisins, 2 tablespoons (24 grams)  Jelly beans, 6  Animal crackers, 8  Regular pop, 4 ounces (120 mL)  Gummy treats, 9  Recognize hypoglycemia. Hypoglycemia occurs with blood glucose levels of 70 mg/dL and below. The risk for hypoglycemia increases when fasting or skipping meals, during or after intense exercise, and during sleep. Hypoglycemia symptoms can include:  Tremors or shakes.  Decreased ability to concentrate.  Sweating.  Increased heart rate.  Headache.  Dry mouth.  Hunger.  Irritability.  Anxiety.  Restless sleep.  Altered speech or coordination.  Confusion.  Treat hypoglycemia promptly. If you are alert and able to safely swallow, follow the 15:15 rule:  Take 15 20 grams of rapid-acting glucose or carbohydrate. Rapid-acting options include glucose gel, glucose tablets, or 4 ounces (120 mL) of fruit juice, regular soda, or low fat milk.  Check your blood glucose level 15 minutes after taking the glucose.  Take 15 20 grams more of glucose if the repeat blood glucose level is still 70 mg/dL or below.  Eat a meal or snack within 1 hour once blood glucose levels return to normal.    Be alert to polyuria and polydipsia which are early signs of hyperglycemia. An early awareness of hyperglycemia allows for prompt treatment. Treat hyperglycemia as directed by your caregiver.  Engage in at least 150 minutes of moderate-intensity physical activity a week, spread over at least 3 days of the week or as directed by your caregiver. In addition, you should engage in resistance exercise at least 2 times a week or as directed by your caregiver.  Adjust your medicine and food intake as needed if you start a new exercise or sport.  Follow your sick day plan at any time you  are unable to eat or drink as usual.  Avoid tobacco use.  Limit alcohol intake to no more than 1 drink per day for nonpregnant women and 2 drinks per day for men. You should drink alcohol only when you are also eating food. Talk with your caregiver whether alcohol is safe for you. Tell your caregiver if you drink alcohol several times a week.  Follow up with your caregiver regularly.  Schedule an eye exam soon after the diagnosis of type 2 diabetes and then annually.  Perform daily skin and foot care. Examine your skin and feet daily for cuts, bruises, redness, nail problems, bleeding, blisters, or sores. A foot exam by a caregiver should be done annually.  Brush your teeth and gums at least twice a day and floss at least once a day. Follow up with your dentist regularly.  Share your diabetes management plan with your workplace or school.  Stay up-to-date with immunizations.  Learn to manage stress.  Obtain ongoing diabetes education and support as needed.  Participate in, or seek rehabilitation as needed to maintain or improve independence and quality of life. Request a physical or occupational therapy referral if you are having foot or hand numbness or difficulties with grooming,   dressing, eating, or physical activity. SEEK MEDICAL CARE IF:   You are unable to eat food or drink fluids for more than 6 hours.  You have nausea and vomiting for more than 6 hours.  Your blood glucose level is over 240 mg/dL.  There is a change in mental status.  You develop an additional serious illness.  You have diarrhea for more than 6 hours.  You have been sick or have had a fever for a couple of days and are not getting better.  You have pain during any physical activity.  SEEK IMMEDIATE MEDICAL CARE IF:  You have difficulty breathing.  You have moderate to large ketone levels. MAKE SURE YOU:  Understand these instructions.  Will watch your condition.  Will get help right away if  you are not doing well or get worse. Document Released: 04/02/2005 Document Revised: 12/26/2011 Document Reviewed: 10/30/2011 Exit[employer] Patient Information 2014 [employer].  

## 2013-07-02 ENCOUNTER — Telehealth: Payer: Self-pay

## 2013-07-02 LAB — COMPREHENSIVE METABOLIC PANEL
ALT: 39 U/L — ABNORMAL HIGH (ref 0–35)
AST: 29 U/L (ref 0–37)
Albumin: 4.4 g/dL (ref 3.5–5.2)
Alkaline Phosphatase: 53 U/L (ref 39–117)
BUN: 12 mg/dL (ref 6–23)
CHLORIDE: 104 meq/L (ref 96–112)
CO2: 27 mEq/L (ref 19–32)
CREATININE: 0.7 mg/dL (ref 0.4–1.2)
Calcium: 9.4 mg/dL (ref 8.4–10.5)
GFR: 86.46 mL/min (ref >60.00)
Glucose, Bld: 140 mg/dL — ABNORMAL HIGH (ref 70–99)
Potassium: 4.2 mEq/L (ref 3.5–5.1)
Sodium: 142 mEq/L (ref 135–145)
Total Bilirubin: 0.5 mg/dL (ref 0.3–1.2)
Total Protein: 7.5 g/dL (ref 6.0–8.3)

## 2013-07-02 LAB — CBC
HCT: 38.7 % (ref 36.0–46.0)
HEMOGLOBIN: 12.8 g/dL (ref 12.0–15.0)
MCHC: 33.2 g/dL (ref 30.0–36.0)
MCV: 89.7 fl (ref 78.0–100.0)
PLATELETS: 207 10*3/uL (ref 150.0–400.0)
RBC: 4.31 Mil/uL (ref 3.87–5.11)
RDW: 12.9 % (ref 11.5–14.6)
WBC: 7.4 10*3/uL (ref 4.5–10.5)

## 2013-07-02 LAB — LIPID PANEL
CHOL/HDL RATIO: 3
Cholesterol: 215 mg/dL — ABNORMAL HIGH (ref 0–200)
HDL: 85.1 mg/dL (ref >39.00)
LDL Cholesterol: 116 mg/dL — ABNORMAL HIGH (ref 0–99)
TRIGLYCERIDES: 69 mg/dL (ref 0.0–149.0)
VLDL: 13.8 mg/dL (ref 0.0–40.0)

## 2013-07-02 LAB — VITAMIN D 25 HYDROXY (VIT D DEFICIENCY, FRACTURES): Vit D, 25-Hydroxy: 24 ng/mL — ABNORMAL LOW (ref 30–89)

## 2013-07-02 LAB — HEMOGLOBIN A1C: Hgb A1c MFr Bld: 7.4 % — ABNORMAL HIGH (ref 4.6–6.5)

## 2013-07-02 MED ORDER — ALENDRONATE SODIUM 70 MG PO TABS: 70.0000 mg | ORAL_TABLET | ORAL | Status: DC

## 2013-07-02 NOTE — Telephone Encounter (Signed)
Pt said she was seen on 07/01/13 and when picked up med from pharmacy, Fosamax and Tramadol was not in refills. Pt is out of Tramadol. Pt request new rx Tramadol 50 mg  Taking 2 tabs q6h prn for pain #240 to [facility].Pt also request refill fosamax. Pt request cb when med called to pharmacy.Please advise.

## 2013-07-03 NOTE — Telephone Encounter (Signed)
Please phone in tramadol On historical mes

## 2013-07-03 NOTE — Telephone Encounter (Signed)
Rx called into pharmacy as requested, pt is aware

## 2013-07-03 NOTE — Telephone Encounter (Signed)
Pt called back and is out of Tramadol and request refill Tramadol to [facility]; pt said was taken off med list and does not understand why.Pt request cb today.

## 2013-07-09 ENCOUNTER — Telehealth: Payer: Self-pay

## 2013-07-09 NOTE — Telephone Encounter (Signed)
Relevant patient education mailed to patient.  

## 2013-08-26 ENCOUNTER — Other Ambulatory Visit: Payer: Self-pay | Admitting: Family Medicine

## 2013-08-26 MED ORDER — TRAMADOL HCL 50 MG PO TABS: 50.0000 mg | ORAL_TABLET | Freq: Three times a day (TID) | ORAL | Status: DC | PRN

## 2013-08-26 NOTE — Telephone Encounter (Signed)
Pt called very frustrated because she said that she requested RX on [facility] with [facility] and they told her they have not received a response from our office.  I informed her that we had not received request and that I would send to Webb Silversmith, NP for approval.  She is out of medication.  She requests call back once RX has been sent.  Please send response to Threasa Beards (I am out of office this afternoon).

## 2013-08-26 NOTE — Telephone Encounter (Signed)
Rx called into pharmacy Called pt to notify her but there was no answer and recording stated the VM was full

## 2013-08-26 NOTE — Telephone Encounter (Signed)
Ok to refill tramadol 

## 2013-08-29 ENCOUNTER — Other Ambulatory Visit: Payer: Self-pay | Admitting: Internal Medicine

## 2013-10-13 ENCOUNTER — Other Ambulatory Visit: Payer: Self-pay

## 2013-10-13 NOTE — Telephone Encounter (Signed)
Last filled 08/26/13--please advise

## 2013-10-14 MED ORDER — TRAMADOL HCL 50 MG PO TABS: 50.0000 mg | ORAL_TABLET | Freq: Three times a day (TID) | ORAL | Status: DC | PRN

## 2013-10-14 NOTE — Telephone Encounter (Signed)
Ok to phone in tramadol 

## 2013-10-14 NOTE — Telephone Encounter (Signed)
Rx called in to pharmacy. 

## 2013-11-22 ENCOUNTER — Other Ambulatory Visit: Payer: Self-pay | Admitting: Internal Medicine

## 2013-11-23 NOTE — Telephone Encounter (Signed)
Last appt was a med refill f/u. Has f/u appt scheduled in 9/15.

## 2013-11-27 ENCOUNTER — Other Ambulatory Visit: Payer: Self-pay | Admitting: *Deleted

## 2013-11-27 MED ORDER — TRAMADOL HCL 50 MG PO TABS: 50.0000 mg | ORAL_TABLET | Freq: Three times a day (TID) | ORAL | Status: DC | PRN

## 2013-11-27 NOTE — Telephone Encounter (Signed)
Ok to phone in tramadol 

## 2013-11-27 NOTE — Telephone Encounter (Signed)
Medication phoned to pharmacy.  

## 2013-11-27 NOTE — Telephone Encounter (Signed)
Faxed refill request. Last Filled:   240 tablet 0 RF on  10/14/13.  Please advise.

## 2013-12-15 ENCOUNTER — Other Ambulatory Visit: Payer: Self-pay | Admitting: *Deleted

## 2013-12-15 MED ORDER — ALENDRONATE SODIUM 70 MG PO TABS: 70.0000 mg | ORAL_TABLET | ORAL | Status: DC

## 2013-12-22 ENCOUNTER — Other Ambulatory Visit: Payer: Self-pay | Admitting: *Deleted

## 2013-12-22 DIAGNOSIS — F411 Generalized anxiety disorder: Secondary | ICD-10-CM

## 2013-12-23 MED ORDER — FLUOXETINE HCL 20 MG PO TABS: 20.0000 mg | ORAL_TABLET | Freq: Every day | ORAL | Status: DC

## 2013-12-23 MED ORDER — GLIPIZIDE 10 MG PO TABS: 10.0000 mg | ORAL_TABLET | Freq: Two times a day (BID) | ORAL | Status: DC

## 2013-12-23 MED ORDER — SITAGLIPTIN PHOSPHATE 100 MG PO TABS: 100.0000 mg | ORAL_TABLET | Freq: Every day | ORAL | Status: DC

## 2013-12-23 MED ORDER — OMEPRAZOLE 40 MG PO CPDR: 40.0000 mg | DELAYED_RELEASE_CAPSULE | Freq: Every day | ORAL | Status: DC

## 2013-12-23 MED ORDER — ATORVASTATIN CALCIUM 20 MG PO TABS: 20.0000 mg | ORAL_TABLET | Freq: Every day | ORAL | Status: DC

## 2014-01-01 ENCOUNTER — Encounter: Payer: Self-pay | Admitting: Internal Medicine

## 2014-01-01 ENCOUNTER — Ambulatory Visit (INDEPENDENT_AMBULATORY_CARE_PROVIDER_SITE_OTHER): Admitting: Internal Medicine

## 2014-01-01 ENCOUNTER — Encounter (INDEPENDENT_AMBULATORY_CARE_PROVIDER_SITE_OTHER): Payer: Self-pay

## 2014-01-01 VITALS — BP 134/66 | HR 82 | Temp 98.2°F | Wt 144.0 lb

## 2014-01-01 DIAGNOSIS — E1165 Type 2 diabetes mellitus with hyperglycemia: Secondary | ICD-10-CM

## 2014-01-01 DIAGNOSIS — K219 Gastro-esophageal reflux disease without esophagitis: Secondary | ICD-10-CM

## 2014-01-01 DIAGNOSIS — E119 Type 2 diabetes mellitus without complications: Secondary | ICD-10-CM

## 2014-01-01 DIAGNOSIS — M129 Arthropathy, unspecified: Secondary | ICD-10-CM

## 2014-01-01 DIAGNOSIS — M810 Age-related osteoporosis without current pathological fracture: Secondary | ICD-10-CM

## 2014-01-01 DIAGNOSIS — M1990 Unspecified osteoarthritis, unspecified site: Secondary | ICD-10-CM

## 2014-01-01 DIAGNOSIS — F411 Generalized anxiety disorder: Secondary | ICD-10-CM

## 2014-01-01 DIAGNOSIS — E785 Hyperlipidemia, unspecified: Secondary | ICD-10-CM

## 2014-01-01 DIAGNOSIS — Z23 Encounter for immunization: Secondary | ICD-10-CM

## 2014-01-01 LAB — COMPREHENSIVE METABOLIC PANEL
ALT: 33 U/L (ref 0–35)
AST: 27 U/L (ref 0–37)
Albumin: 4.3 g/dL (ref 3.5–5.2)
Alkaline Phosphatase: 61 U/L (ref 39–117)
BUN: 17 mg/dL (ref 6–23)
CALCIUM: 9.8 mg/dL (ref 8.4–10.5)
CO2: 29 mEq/L (ref 19–32)
CREATININE: 0.8 mg/dL (ref 0.4–1.2)
Chloride: 104 mEq/L (ref 96–112)
GFR: 79.96 mL/min (ref >60.00)
GLUCOSE: 93 mg/dL (ref 70–99)
Potassium: 4.6 mEq/L (ref 3.5–5.1)
Sodium: 140 mEq/L (ref 135–145)
Total Bilirubin: 0.7 mg/dL (ref 0.2–1.2)
Total Protein: 7.3 g/dL (ref 6.0–8.3)

## 2014-01-01 LAB — LIPID PANEL
CHOL/HDL RATIO: 2
CHOLESTEROL: 154 mg/dL (ref 0–200)
HDL: 62.6 mg/dL (ref >39.00)
LDL Cholesterol: 81 mg/dL (ref 0–99)
NonHDL: 91.4
TRIGLYCERIDES: 54 mg/dL (ref 0.0–149.0)
VLDL: 10.8 mg/dL (ref 0.0–40.0)

## 2014-01-01 LAB — CBC
HCT: 37.7 % (ref 36.0–46.0)
HEMOGLOBIN: 12.7 g/dL (ref 12.0–15.0)
MCHC: 33.6 g/dL (ref 30.0–36.0)
MCV: 88.9 fl (ref 78.0–100.0)
PLATELETS: 178 10*3/uL (ref 150.0–400.0)
RBC: 4.24 Mil/uL (ref 3.87–5.11)
RDW: 12.9 % (ref 11.5–15.5)
WBC: 9.1 10*3/uL (ref 4.0–10.5)

## 2014-01-01 LAB — MICROALBUMIN / CREATININE URINE RATIO
CREATININE, U: 43.5 mg/dL
MICROALB UR: 9.5 mg/dL — AB (ref 0.0–1.9)
MICROALB/CREAT RATIO: 21.8 mg/g (ref 0.0–30.0)

## 2014-01-01 LAB — HEMOGLOBIN A1C: Hgb A1c MFr Bld: 8.1 % — ABNORMAL HIGH (ref 4.6–6.5)

## 2014-01-01 MED ORDER — TRAMADOL HCL 50 MG PO TABS: 100.0000 mg | ORAL_TABLET | Freq: Three times a day (TID) | ORAL | Status: DC | PRN

## 2014-01-01 NOTE — Patient Instructions (Addendum)

## 2014-01-01 NOTE — Assessment & Plan Note (Signed)
Will order bone scan Continue fosamax

## 2014-01-01 NOTE — Assessment & Plan Note (Signed)
On a low fat diet Will repeat lipid profile and cmet today Will refill/adjust med if needed

## 2014-01-01 NOTE — Addendum Note (Signed)
Addended by: Lurlean Nanny on: 01/01/2014 02:58 PM   Modules accepted: Orders

## 2014-01-01 NOTE — Assessment & Plan Note (Signed)
Mood controlled on prozac

## 2014-01-01 NOTE — Assessment & Plan Note (Addendum)
Does reports some hyperglycemia On metformin, glipizide and januvia Will check A1C and microalbumin today Encouraged her to continue to consume low carb diet Flu and pneumovax

## 2014-01-01 NOTE — Assessment & Plan Note (Signed)
Not controlled on omeprazole Will switch to protonix

## 2014-01-01 NOTE — Progress Notes (Signed)
Subjective:    Patient ID: Donna Moran, female    DOB: June 29, 1955, 58 y.o.   MRN: [id number]  HPI  Pt presents to the clinic today for 6 month follow up of chronic conditions.  GAD: On prozac daily. Mood well controlled.  Arthritis: doing okay on tramadol. Needs new RX today  HLD: Takes lipitor daily. Denies myalgias. She is tried to consume a low fat diet.  DM2: Last A1C 7.4% on metformin, glipizide and januvia. Her sugars range was 74-294. She is consuming a low carb diet. Her last exam was 04/2013. She has never had flu shot and pneumonia shot.  Osteoporosis: Last bone scan was more than 2 years ago. On fosamax.  GERD: not well controlled on omeprazole. Would like to try something else.  Review of Systems      Past Medical History  Diagnosis Date  . Diabetes mellitus without complication   . Hyperlipidemia   . History of blood transfusion     Current Outpatient Prescriptions  Medication Sig Dispense Refill  . alendronate (FOSAMAX) 70 MG tablet Take 1 tablet (70 mg total) by mouth every 7 (seven) days. Take with a full glass of water on an empty stomach.  12 tablet  1  . atorvastatin (LIPITOR) 20 MG tablet Take 1 tablet (20 mg total) by mouth daily.  30 tablet  2  . FLUoxetine (PROZAC) 20 MG tablet Take 1 tablet (20 mg total) by mouth daily.  30 tablet  2  . gabapentin (NEURONTIN) 300 MG capsule TAKE 2 CAPSULES 3 TIMES A DAY  180 capsule  3  . glipiZIDE (GLUCOTROL) 10 MG tablet Take 1 tablet (10 mg total) by mouth 2 (two) times daily before a meal.  60 tablet  2  . glucose blood (FREESTYLE LITE) test strip 1 each by Other route 2 (two) times daily as needed for other.  100 each  2  . metFORMIN (GLUCOPHAGE) 1000 MG tablet Take 0.5 tablets (500 mg total) by mouth 2 (two) times daily with a meal.  60 tablet  5  . omeprazole (PRILOSEC) 40 MG capsule Take 1 capsule (40 mg total) by mouth daily.  30 capsule  5  . sitaGLIPtin (JANUVIA) 100 MG tablet Take 1 tablet (100 mg  total) by mouth daily.  30 tablet  2  . traMADol (ULTRAM) 50 MG tablet Take 1 tablet (50 mg total) by mouth every 8 (eight) hours as needed.  240 tablet  0   No current facility-administered medications for this visit.    Allergies  Allergen Reactions  . Aspirin Nausea Only    Can take coated aspirin    Family History  Problem Relation Age of Onset  . Cancer Neg Hx   . Diabetes Neg Hx   . Stroke Neg Hx     History   Social History  . Marital Status: Married    Spouse Name: N/A    Number of Children: 2  . Years of Education: N/A   Occupational History  . Unemployed    Social History Main Topics  . Smoking status: Former Research scientist (life sciences)  . Smokeless tobacco: Never Used  . Alcohol Use: No  . Drug Use: No  . Sexual Activity: Yes   Other Topics Concern  . Not on file   Social History Narrative   Regular exercise-no   Caffeine Use-yes     Constitutional: Denies fever, malaise, fatigue, headache or abrupt weight changes.  Respiratory: Denies difficulty breathing, shortness of breath, cough or  sputum production.   Cardiovascular: Denies chest pain, chest tightness, palpitations or swelling in the hands or feet.  Gastrointestinal: Pt reports reflux. Denies abdominal pain, bloating, constipation, diarrhea or blood in the stool.  Musculoskeletal: Denies decrease in range of motion, difficulty with gait, muscle pain or joint swelling.  Skin: Denies redness, rashes, lesions or ulcercations.  Neurological: Denies dizziness, difficulty with memory, difficulty with speech or problems with balance and coordination.   No other specific complaints in a complete review of systems (except as listed in HPI above).  Objective:   Physical Exam  BP 134/66  Pulse 82  Temp(Src) 98.2 F (36.8 C) (Oral)  Wt 144 lb (65.318 kg)  SpO2 98% Wt Readings from Last 3 Encounters:  01/01/14 144 lb (65.318 kg)  07/01/13 135 lb (61.236 kg)  10/07/12 140 lb (63.504 kg)    General: Appears her  stated age, well developed, well nourished in NAD. Skin: Warm, dry and intact. No rashes, lesions or ulcerations noted. Cardiovascular: Normal rate and rhythm. S1,S2 noted.  No murmur, rubs or gallops noted.  Pulmonary/Chest: Normal effort and positive vesicular breath sounds. No respiratory distress. No wheezes, rales or ronchi noted.  Abdomen: Soft and nontender. Normal bowel sounds, no bruits noted. No distention or masses noted. Liver, spleen and kidneys non palpable. Neurological: Alert and oriented.  Psychiatric: Mood and affect normal. Behavior is normal. Judgment and thought content normal.     BMET    Component Value Date/Time   NA 142 07/01/2013 1537   NA 142 01/01/2012 1336   K 4.2 07/01/2013 1537   K 4.0 01/01/2012 1336   CL 104 07/01/2013 1537   CL 104 01/01/2012 1336   CO2 27 07/01/2013 1537   CO2 28 01/01/2012 1336   GLUCOSE 140* 07/01/2013 1537   GLUCOSE 128* 01/01/2012 1336   BUN 12 07/01/2013 1537   BUN 12.0 01/01/2012 1336   CREATININE 0.7 07/01/2013 1537   CREATININE 0.8 01/01/2012 1336   CALCIUM 9.4 07/01/2013 1537   CALCIUM 9.9 01/01/2012 1336    Lipid Panel     Component Value Date/Time   CHOL 215* 07/01/2013 1537   TRIG 69.0 07/01/2013 1537   HDL 85.10 07/01/2013 1537   CHOLHDL 3 07/01/2013 1537   VLDL 13.8 07/01/2013 1537   LDLCALC 116* 07/01/2013 1537    CBC    Component Value Date/Time   WBC 7.4 07/01/2013 1537   WBC 7.0 01/01/2012 1336   RBC 4.31 07/01/2013 1537   RBC 4.72 01/01/2012 1336   HGB 12.8 07/01/2013 1537   HGB 13.8 01/01/2012 1336   HCT 38.7 07/01/2013 1537   HCT 42.0 01/01/2012 1336   PLT 207.0 07/01/2013 1537   PLT 219 01/01/2012 1336   MCV 89.7 07/01/2013 1537   MCV 89.0 01/01/2012 1336   MCH 29.2 01/01/2012 1336   MCHC 33.2 07/01/2013 1537   MCHC 32.9 01/01/2012 1336   RDW 12.9 07/01/2013 1537   RDW 12.2 01/01/2012 1336   LYMPHSABS 1.7 01/01/2012 1336   MONOABS 0.5 01/01/2012 1336   EOSABS 0.1 01/01/2012 1336   BASOSABS 0.1 01/01/2012 1336    Hgb  A1C Lab Results  Component Value Date   HGBA1C 7.4* 07/01/2013         Assessment & Plan:

## 2014-01-01 NOTE — Progress Notes (Signed)
Pre visit review using our clinic review tool, if applicable. No additional management support is needed unless otherwise documented below in the visit note. 

## 2014-01-01 NOTE — Assessment & Plan Note (Signed)
Well controlled on tramadol RX provided today

## 2014-01-04 MED ORDER — METFORMIN HCL 1000 MG PO TABS: 1000.0000 mg | ORAL_TABLET | Freq: Two times a day (BID) | ORAL | Status: DC

## 2014-01-04 NOTE — Addendum Note (Signed)
Addended by: Lurlean Nanny on: 01/04/2014 11:09 AM   Modules accepted: Orders

## 2014-01-07 ENCOUNTER — Other Ambulatory Visit: Payer: Self-pay | Admitting: Internal Medicine

## 2014-01-18 ENCOUNTER — Other Ambulatory Visit: Payer: Self-pay

## 2014-01-18 DIAGNOSIS — F411 Generalized anxiety disorder: Secondary | ICD-10-CM

## 2014-01-18 DIAGNOSIS — E119 Type 2 diabetes mellitus without complications: Secondary | ICD-10-CM

## 2014-01-18 MED ORDER — GABAPENTIN 300 MG PO CAPS: 600.0000 mg | ORAL_CAPSULE | Freq: Three times a day (TID) | ORAL | Status: DC | PRN

## 2014-01-18 MED ORDER — METFORMIN HCL 1000 MG PO TABS: 1000.0000 mg | ORAL_TABLET | Freq: Two times a day (BID) | ORAL | Status: DC

## 2014-01-18 MED ORDER — OMEPRAZOLE 40 MG PO CPDR: 40.0000 mg | DELAYED_RELEASE_CAPSULE | Freq: Every day | ORAL | Status: DC

## 2014-01-18 MED ORDER — ATORVASTATIN CALCIUM 20 MG PO TABS: 20.0000 mg | ORAL_TABLET | Freq: Every day | ORAL | Status: DC

## 2014-01-18 MED ORDER — SITAGLIPTIN PHOSPHATE 100 MG PO TABS: 100.0000 mg | ORAL_TABLET | Freq: Every day | ORAL | Status: DC

## 2014-01-18 MED ORDER — FLUOXETINE HCL 20 MG PO TABS: 20.0000 mg | ORAL_TABLET | Freq: Every day | ORAL | Status: DC

## 2014-01-18 MED ORDER — GLIPIZIDE 10 MG PO TABS: 10.0000 mg | ORAL_TABLET | Freq: Two times a day (BID) | ORAL | Status: DC

## 2014-01-18 NOTE — Telephone Encounter (Signed)
Please advise as pt is now requesting medication sent to mail order- i was sending 1 #90 day supply with no refill to ensure she f/u but need authorization for Prozac--please advise if you authorize refill as pt had refills sent 9/15 with 2 refills to local pharmacy

## 2014-04-01 ENCOUNTER — Encounter: Payer: Self-pay | Admitting: Internal Medicine

## 2014-04-05 ENCOUNTER — Encounter: Payer: Self-pay | Admitting: Internal Medicine

## 2014-04-05 ENCOUNTER — Ambulatory Visit (INDEPENDENT_AMBULATORY_CARE_PROVIDER_SITE_OTHER): Admitting: Internal Medicine

## 2014-04-05 ENCOUNTER — Other Ambulatory Visit: Payer: Self-pay

## 2014-04-05 VITALS — BP 124/76 | HR 64 | Temp 98.3°F | Wt 144.0 lb

## 2014-04-05 DIAGNOSIS — E119 Type 2 diabetes mellitus without complications: Secondary | ICD-10-CM

## 2014-04-05 DIAGNOSIS — M1990 Unspecified osteoarthritis, unspecified site: Secondary | ICD-10-CM

## 2014-04-05 DIAGNOSIS — E1142 Type 2 diabetes mellitus with diabetic polyneuropathy: Secondary | ICD-10-CM

## 2014-04-05 DIAGNOSIS — K219 Gastro-esophageal reflux disease without esophagitis: Secondary | ICD-10-CM

## 2014-04-05 DIAGNOSIS — E785 Hyperlipidemia, unspecified: Secondary | ICD-10-CM

## 2014-04-05 DIAGNOSIS — F411 Generalized anxiety disorder: Secondary | ICD-10-CM

## 2014-04-05 DIAGNOSIS — R229 Localized swelling, mass and lump, unspecified: Secondary | ICD-10-CM

## 2014-04-05 LAB — HEMOGLOBIN A1C: Hgb A1c MFr Bld: 7.7 % — ABNORMAL HIGH (ref 4.6–6.5)

## 2014-04-05 MED ORDER — FLUOXETINE HCL 20 MG PO TABS: 20.0000 mg | ORAL_TABLET | Freq: Every day | ORAL | Status: DC

## 2014-04-05 MED ORDER — GLUCOSE BLOOD VI STRP: ORAL_STRIP | Status: DC

## 2014-04-05 MED ORDER — METFORMIN HCL 1000 MG PO TABS: 1000.0000 mg | ORAL_TABLET | Freq: Two times a day (BID) | ORAL | Status: DC

## 2014-04-05 MED ORDER — GLIPIZIDE 10 MG PO TABS: 10.0000 mg | ORAL_TABLET | Freq: Two times a day (BID) | ORAL | Status: DC

## 2014-04-05 MED ORDER — TRAMADOL HCL 50 MG PO TABS: 100.0000 mg | ORAL_TABLET | Freq: Three times a day (TID) | ORAL | Status: DC | PRN

## 2014-04-05 MED ORDER — SITAGLIPTIN PHOSPHATE 100 MG PO TABS: 100.0000 mg | ORAL_TABLET | Freq: Every day | ORAL | Status: DC

## 2014-04-05 MED ORDER — OMEPRAZOLE 40 MG PO CPDR: 40.0000 mg | DELAYED_RELEASE_CAPSULE | Freq: Every day | ORAL | Status: DC

## 2014-04-05 MED ORDER — ATORVASTATIN CALCIUM 20 MG PO TABS: 20.0000 mg | ORAL_TABLET | Freq: Every day | ORAL | Status: DC

## 2014-04-05 NOTE — Assessment & Plan Note (Signed)
Denies myalgias on lipitor RX provided today

## 2014-04-05 NOTE — Patient Instructions (Signed)

## 2014-04-05 NOTE — Assessment & Plan Note (Signed)
Stable on prozac Rx provided today

## 2014-04-05 NOTE — Assessment & Plan Note (Signed)
Well controlled on Tramadol RX given today

## 2014-04-05 NOTE — Assessment & Plan Note (Signed)
Will repeat A1C today Foot exam today

## 2014-04-05 NOTE — Progress Notes (Signed)
Subjective:    Patient ID: Donna Moran, female    DOB: [birth year], 58 y.o.   MRN: [id number]  HPI  Pt presents to the clinic today for her 3 month follow up of DM 2. At her last visit, her A1C was 8.1 %. Her Metformin was increased at that time to 1000 mg BID. She is also on Glipizide 10 mg BID and Januvia 100 mg daily. Microalbumin was done 12/2013. Her blood sugars are still running high in the mid 200's. Her last eye exam 12/2013. Her flu and pneumonia shot were given 12/2013. She continues to have some paresthesias but well controlled on Neurontin.   [facility]  Arthritis: well controlled on tramadol  GAD: well controlled on prozac  GERD: Fair control on prilosec. She does occasionally have breakthrough symptoms with certain foods.   HLD: Denies myalgias on Lipitor.   She does have some concerns about this hematoma on her right shoulder. A 12 pack fell on her shoulder many years ago. She has had this hematoma that will not reabsorb. She would like surgical evaluation to see if it can be removed.  Review of Systems      Past Medical History  Diagnosis Date  . Diabetes mellitus without complication   . Hyperlipidemia   . History of blood transfusion     Current Outpatient Prescriptions  Medication Sig Dispense Refill  . alendronate (FOSAMAX) 70 MG tablet Take 1 tablet (70 mg total) by mouth every 7 (seven) days. Take with a full glass of water on an empty stomach. 12 tablet 1  . atorvastatin (LIPITOR) 20 MG tablet Take 1 tablet (20 mg total) by mouth daily. 90 tablet 0  . FLUoxetine (PROZAC) 20 MG tablet Take 1 tablet (20 mg total) by mouth daily. 90 tablet 0  . FREESTYLE LITE test strip TEST TWICE DAILY AS NEEDED 100 each 5  . gabapentin (NEURONTIN) 300 MG capsule Take 2 capsules (600 mg total) by mouth 3 (three) times daily as needed. 540 capsule 0  . glipiZIDE (GLUCOTROL) 10 MG tablet Take 1 tablet (10 mg total) by mouth 2  (two) times daily before a meal. 180 tablet 0  . metFORMIN (GLUCOPHAGE) 1000 MG tablet Take 1 tablet (1,000 mg total) by mouth 2 (two) times daily with a meal. 180 tablet 0  . omeprazole (PRILOSEC) 40 MG capsule Take 1 capsule (40 mg total) by mouth daily. 90 capsule 0  . sitaGLIPtin (JANUVIA) 100 MG tablet Take 1 tablet (100 mg total) by mouth daily. 90 tablet 0  . traMADol (ULTRAM) 50 MG tablet Take 2 tablets (100 mg total) by mouth every 8 (eight) hours as needed. 240 tablet 0   No current facility-administered medications for this visit.    Allergies  Allergen Reactions  . Aspirin Nausea Only    Can take coated aspirin    Family History  Problem Relation Age of Onset  . Cancer Neg Hx   . Diabetes Neg Hx   . Stroke Neg Hx     History   Social History  . Marital Status: Married    Spouse Name: N/A    Number of Children: 2  . Years of Education: N/A   Occupational History  . Unemployed    Social History Main Topics  . Smoking status: Former Research scientist (life sciences)  . Smokeless tobacco: Never Used  . Alcohol Use: No  . Drug Use: No  . Sexual Activity: Yes   Other  Topics Concern  . Not on file   Social History Narrative   Regular exercise-no   Caffeine Use-yes     Constitutional: Denies fever, malaise, fatigue, headache or abrupt weight changes.  HEENT: Pt reports blurred vision. Denies eye pain, eye redness, ear pain, ringing in the ears, wax buildup, runny nose, nasal congestion, bloody nose, or sore throat. Respiratory: Denies difficulty breathing, shortness of breath, cough or sputum production.   Cardiovascular: Denies chest pain, chest tightness, palpitations or swelling in the hands or feet.  Gastrointestinal: Denies abdominal pain, bloating, constipation, diarrhea or blood in the stool.  Skin: Pt reports hematoma of right shoulder. Denies redness, rashes, lesions or ulcercations.  MSK: Pt reports joint pains. Denies difficulty with gait or muscle pain.  Neurological:  Denies dizziness, difficulty with memory, difficulty with speech or problems with balance and coordination.  Psych: Pt reports anxiety. Denies depression, SI/HI.  No other specific complaints in a complete review of systems (except as listed in HPI above).  Objective:   Physical Exam  BP 124/76 mmHg  Pulse 64  Temp(Src) 98.3 F (36.8 C) (Oral)  Wt 144 lb (65.318 kg)  SpO2 98% Wt Readings from Last 3 Encounters:  04/05/14 144 lb (65.318 kg)  01/01/14 144 lb (65.318 kg)  07/01/13 135 lb (61.236 kg)    General: Appears her stated age, well developed, well nourished in NAD. Skin: Warm, dry and intact. No rashes, lesions or ulcerations noted. Golf ball size mass noted of right shoulder, nontender with palpation. HEENT: Head: normal shape and size; Eyes: sclera white, no icterus, conjunctiva pink, PERRLA and EOMs intact;  Cardiovascular: Normal rate and rhythm. S1,S2 noted.  No murmur, rubs or gallops noted.  Pulmonary/Chest: Normal effort and positive vesicular breath sounds. No respiratory distress. No wheezes, rales or ronchi noted.  Abdomen: Soft and nontender. Normal bowel sounds, no bruits noted. No distention or masses noted. Liver, spleen and kidneys non palpable. MSK: Good flexion and extension of bilateral knees. Strength 5/5 BLE. No joint swelling noted. No difficulty with gait. Neurological: Alert and oriented. Sensation intact bilaterally. Psych: Mood and affect normal.  BMET    Component Value Date/Time   NA 140 01/01/2014 1445   NA 142 01/01/2012 1336   K 4.6 01/01/2014 1445   K 4.0 01/01/2012 1336   CL 104 01/01/2014 1445   CL 104 01/01/2012 1336   CO2 29 01/01/2014 1445   CO2 28 01/01/2012 1336   GLUCOSE 93 01/01/2014 1445   GLUCOSE 128* 01/01/2012 1336   BUN 17 01/01/2014 1445   BUN 12.0 01/01/2012 1336   CREATININE 0.8 01/01/2014 1445   CREATININE 0.8 01/01/2012 1336   CALCIUM 9.8 01/01/2014 1445   CALCIUM 9.9 01/01/2012 1336    Lipid Panel       Component Value Date/Time   CHOL 154 01/01/2014 1445   TRIG 54.0 01/01/2014 1445   HDL 62.60 01/01/2014 1445   CHOLHDL 2 01/01/2014 1445   VLDL 10.8 01/01/2014 1445   LDLCALC 81 01/01/2014 1445    CBC    Component Value Date/Time   WBC 9.1 01/01/2014 1445   WBC 7.0 01/01/2012 1336   RBC 4.24 01/01/2014 1445   RBC 4.72 01/01/2012 1336   HGB 12.7 01/01/2014 1445   HGB 13.8 01/01/2012 1336   HCT 37.7 01/01/2014 1445   HCT 42.0 01/01/2012 1336   PLT 178.0 01/01/2014 1445   PLT 219 01/01/2012 1336   MCV 88.9 01/01/2014 1445   MCV 89.0 01/01/2012 1336  MCH 29.2 01/01/2012 1336   MCHC 33.6 01/01/2014 1445   MCHC 32.9 01/01/2012 1336   RDW 12.9 01/01/2014 1445   RDW 12.2 01/01/2012 1336   LYMPHSABS 1.7 01/01/2012 1336   MONOABS 0.5 01/01/2012 1336   EOSABS 0.1 01/01/2012 1336   BASOSABS 0.1 01/01/2012 1336    Hgb A1C Lab Results  Component Value Date   HGBA1C 8.1* 01/01/2014         Assessment & Plan:   Right shoulder hematoma:  Will refer to gen surg for surgical evaluation  RTC in 6 months for physical exam

## 2014-04-05 NOTE — Progress Notes (Signed)
Pre visit review using our clinic review tool, if applicable. No additional management support is needed unless otherwise documented below in the visit note. 

## 2014-04-05 NOTE — Assessment & Plan Note (Signed)
Continue Prilosec RX provided today Avoid foods that make your reflux worse

## 2014-04-06 ENCOUNTER — Encounter: Payer: Self-pay | Admitting: *Deleted

## 2014-04-06 ENCOUNTER — Encounter: Payer: Self-pay | Admitting: Internal Medicine

## 2014-05-04 ENCOUNTER — Other Ambulatory Visit: Payer: Self-pay | Admitting: Internal Medicine

## 2014-05-04 DIAGNOSIS — R229 Localized swelling, mass and lump, unspecified: Secondary | ICD-10-CM

## 2014-06-03 ENCOUNTER — Other Ambulatory Visit: Payer: Self-pay | Admitting: Internal Medicine

## 2014-06-03 DIAGNOSIS — R229 Localized swelling, mass and lump, unspecified: Secondary | ICD-10-CM

## 2014-06-09 ENCOUNTER — Telehealth: Payer: Self-pay

## 2014-06-09 NOTE — Telephone Encounter (Signed)
Pt's FBS this morning ws 220 and pt took metformin,glipizide and [place] as instructed;one hour later BS was 47; pt was sweaty. Pt ate something and BS was better and pt felt better. Pt said happens almost everyday. Pt wants to know what to do/ pt request cb [facility].

## 2014-06-09 NOTE — Telephone Encounter (Signed)
duplicate

## 2014-06-09 NOTE — Telephone Encounter (Signed)
Per verbal order from [facility], pt cant try to cut Glipizide in 1/2 or completely stop the Januvia, check sugars x 1 week--fasting morning and notify us with low readings--pt states she will try the 1/2 table of Glipizide 1st and call back with results or if she has low readings--pt expressed understanding

## 2014-06-16 ENCOUNTER — Telehealth: Payer: Self-pay | Admitting: Internal Medicine

## 2014-06-16 ENCOUNTER — Other Ambulatory Visit: Payer: Self-pay

## 2014-06-16 DIAGNOSIS — M1990 Unspecified osteoarthritis, unspecified site: Secondary | ICD-10-CM

## 2014-06-16 MED ORDER — TRAMADOL HCL 50 MG PO TABS: 100.0000 mg | ORAL_TABLET | Freq: Three times a day (TID) | ORAL | Status: DC | PRN

## 2014-06-16 NOTE — Telephone Encounter (Signed)
Spoke tp pt--please refer to 06/09/14 phone note

## 2014-06-16 NOTE — Telephone Encounter (Signed)
Ok to phone in tramadol 

## 2014-06-16 NOTE — Telephone Encounter (Signed)
Pt has some questions regarding her DM and her medication that she takes for her DM. She is requesting a call back regarding this.

## 2014-06-16 NOTE — Telephone Encounter (Signed)
Pt called back and stated that her fasting BG is now 300 or more in the morning instead of 200---per verbal okay order--pt to return to full Glipizide pill and continue Metformin but stop Januvia---record BG for several days and call back with results--pt expressed understanding

## 2014-06-16 NOTE — Telephone Encounter (Signed)
Last filled 04/05/14--please advise

## 2014-06-17 NOTE — Telephone Encounter (Signed)
Rx called in to pharmacy. 

## 2014-06-28 ENCOUNTER — Telehealth: Payer: Self-pay | Admitting: Internal Medicine

## 2014-06-28 NOTE — Telephone Encounter (Signed)
Pt called to update you on her medication situation. She has quit taking Januvia. Pt is feeling okay but her blood sugar is running high. This morning it was 254.  Does the pt need to start taking her Januvia again? Please advise.  Pt call back # [phone]

## 2014-06-28 NOTE — Telephone Encounter (Signed)
Pt states she is okay with starting a different medication. Pt has tricare for insurance and I can go to the website to check on what you may want to change it to. Let me know and I will call pt as soon as [facility]

## 2014-06-28 NOTE — Telephone Encounter (Signed)
Would she like to try a different medication instead of the Januvia. I agree that we need to start her on something. Also verify if she is taking 10 mg of the Glipizide

## 2014-06-29 MED ORDER — LINAGLIPTIN 5 MG PO TABS: 5.0000 mg | ORAL_TABLET | Freq: Every day | ORAL | Status: DC

## 2014-06-29 NOTE — Telephone Encounter (Signed)
Looked on formulary and tradjenta is covered more likely without review and cheaper in cost--rx sent to pharmacy--will call pt to let her know

## 2014-06-29 NOTE — Telephone Encounter (Signed)
Can we see if Invokana or Tradjenta are on her formulary?

## 2014-06-29 NOTE — Addendum Note (Signed)
Addended by: Lurlean Nanny on: 06/29/2014 08:49 AM   Modules accepted: Orders, Medications

## 2014-06-30 MED ORDER — LINAGLIPTIN 5 MG PO TABS: 5.0000 mg | ORAL_TABLET | Freq: Every day | ORAL | Status: DC

## 2014-06-30 NOTE — Telephone Encounter (Signed)
Called pt to let her know new Rx has been sent to mail order--at first was accidentally sent to local, but I know pt uses mail order--VM was not set up so I could not LMOVM

## 2014-06-30 NOTE — Addendum Note (Signed)
Addended by: Lurlean Nanny on: 06/30/2014 09:02 AM   Modules accepted: Orders

## 2014-07-05 ENCOUNTER — Ambulatory Visit: Admitting: Internal Medicine

## 2014-07-13 ENCOUNTER — Other Ambulatory Visit: Payer: Self-pay | Admitting: *Deleted

## 2014-07-13 DIAGNOSIS — M1990 Unspecified osteoarthritis, unspecified site: Secondary | ICD-10-CM

## 2014-07-13 NOTE — Telephone Encounter (Signed)
Ok to print but can you print under Dr. Deborra Medina name and have her sign it for me while I am out of town?

## 2014-07-13 NOTE — Telephone Encounter (Signed)
Last office visit 04/05/2014.  Last refilled 06/16/2014 for #[id number].  Ok to refill?  Please print Rx to fax to mail order.

## 2014-07-14 MED ORDER — TRAMADOL HCL 50 MG PO TABS: 100.0000 mg | ORAL_TABLET | Freq: Three times a day (TID) | ORAL | Status: DC | PRN

## 2014-07-14 NOTE — Telephone Encounter (Signed)
Rx faxed to [facility] [phone].

## 2014-07-14 NOTE — Telephone Encounter (Signed)
Yes I would be happy to since [place] approved rx.

## 2014-07-14 NOTE — Telephone Encounter (Signed)
Rx printed and placed in Dr. Hulen Shouts in box for signature.

## 2014-07-14 NOTE — Addendum Note (Signed)
Addended by: Carter Kitten on: 07/14/2014 08:19 AM   Modules accepted: Orders

## 2014-08-10 ENCOUNTER — Other Ambulatory Visit: Payer: Self-pay | Admitting: Internal Medicine

## 2014-08-13 ENCOUNTER — Other Ambulatory Visit: Payer: Self-pay | Admitting: *Deleted

## 2014-08-13 ENCOUNTER — Other Ambulatory Visit: Payer: Self-pay

## 2014-08-13 MED ORDER — LINAGLIPTIN 5 MG PO TABS: 5.0000 mg | ORAL_TABLET | Freq: Every day | ORAL | Status: DC

## 2014-09-01 ENCOUNTER — Other Ambulatory Visit: Payer: Self-pay

## 2014-09-01 MED ORDER — LINAGLIPTIN 5 MG PO TABS: 5.0000 mg | ORAL_TABLET | Freq: Every day | ORAL | Status: DC

## 2014-09-04 ENCOUNTER — Other Ambulatory Visit: Payer: Self-pay | Admitting: Internal Medicine

## 2014-09-06 NOTE — Telephone Encounter (Signed)
Last filled 12/15/13 with 1 refill--please verify if it is okay for pt to continue

## 2014-09-20 ENCOUNTER — Other Ambulatory Visit: Payer: Self-pay | Admitting: Internal Medicine

## 2014-09-27 ENCOUNTER — Other Ambulatory Visit: Payer: Self-pay

## 2014-09-27 MED ORDER — ALENDRONATE SODIUM 70 MG PO TABS: 70.0000 mg | ORAL_TABLET | ORAL | Status: DC

## 2014-10-04 ENCOUNTER — Encounter: Admitting: Internal Medicine

## 2014-10-05 ENCOUNTER — Encounter: Admitting: Internal Medicine

## 2014-10-20 ENCOUNTER — Other Ambulatory Visit: Payer: Self-pay | Admitting: Internal Medicine

## 2014-10-22 ENCOUNTER — Other Ambulatory Visit: Payer: Self-pay | Admitting: Internal Medicine

## 2014-10-22 DIAGNOSIS — M1990 Unspecified osteoarthritis, unspecified site: Secondary | ICD-10-CM

## 2014-10-22 DIAGNOSIS — F411 Generalized anxiety disorder: Secondary | ICD-10-CM

## 2014-10-22 DIAGNOSIS — E119 Type 2 diabetes mellitus without complications: Secondary | ICD-10-CM

## 2014-10-22 MED ORDER — FLUOXETINE HCL 20 MG PO TABS: 20.0000 mg | ORAL_TABLET | Freq: Every day | ORAL | Status: DC

## 2014-10-22 MED ORDER — GLIPIZIDE 10 MG PO TABS: 10.0000 mg | ORAL_TABLET | Freq: Two times a day (BID) | ORAL | Status: DC

## 2014-10-22 MED ORDER — ATORVASTATIN CALCIUM 20 MG PO TABS: 20.0000 mg | ORAL_TABLET | Freq: Every day | ORAL | Status: DC

## 2014-10-22 MED ORDER — METFORMIN HCL 1000 MG PO TABS: 1000.0000 mg | ORAL_TABLET | Freq: Two times a day (BID) | ORAL | Status: DC

## 2014-10-22 NOTE — Telephone Encounter (Signed)
Pt last seen 03/2014 and pt was to return in 6 months for CPX; pt had to cancel 09/2014 CPX. Pt has moved to [place] and pt wants to continue coming to [place] but is not sure about transportation. Pt does not want 30 day supply of med called to local pharmacy because too expensive. Can pt get 90 day refills on atorvastatin,fluoxetine,glipizide and [facility]. Pt has approx 2 weeks of med left. Pt request cb on 10/25/14.

## 2014-10-22 NOTE — Telephone Encounter (Signed)
Pt needs to talk to someone to get refills on meds.

## 2014-10-22 NOTE — Telephone Encounter (Signed)
Yes but unless she followsup up, she will not be able to get any refills

## 2014-10-26 MED ORDER — TRAMADOL HCL 50 MG PO TABS: 100.0000 mg | ORAL_TABLET | Freq: Three times a day (TID) | ORAL | Status: DC | PRN

## 2014-10-26 NOTE — Telephone Encounter (Signed)
Rx called in to pharmacy. 

## 2014-10-26 NOTE — Telephone Encounter (Signed)
Ok to phone in tramadol 

## 2014-10-26 NOTE — Addendum Note (Signed)
Addended by: Lurlean Nanny on: 10/26/2014 12:08 PM   Modules accepted: Orders

## 2014-10-26 NOTE — Telephone Encounter (Signed)
Last filled 07/14/2014--please advise

## 2014-10-26 NOTE — Addendum Note (Signed)
Addended by: Jearld Fenton on: 10/26/2014 12:42 PM   Modules accepted: Orders

## 2014-12-18 ENCOUNTER — Other Ambulatory Visit: Payer: Self-pay | Admitting: Internal Medicine

## 2015-01-03 ENCOUNTER — Other Ambulatory Visit: Payer: Self-pay | Admitting: Internal Medicine

## 2015-01-04 ENCOUNTER — Other Ambulatory Visit: Payer: Self-pay | Admitting: Internal Medicine

## 2015-01-16 ENCOUNTER — Other Ambulatory Visit: Payer: Self-pay | Admitting: Internal Medicine

## 2015-01-24 ENCOUNTER — Other Ambulatory Visit: Payer: Self-pay | Admitting: Internal Medicine

## 2015-01-26 ENCOUNTER — Ambulatory Visit (INDEPENDENT_AMBULATORY_CARE_PROVIDER_SITE_OTHER)
Admission: RE | Admit: 2015-01-26 | Discharge: 2015-01-26 | Disposition: A | Discharge status: Home / Self Care | Source: Ambulatory Visit | Attending: Internal Medicine | Admitting: Internal Medicine

## 2015-01-26 ENCOUNTER — Ambulatory Visit (INDEPENDENT_AMBULATORY_CARE_PROVIDER_SITE_OTHER): Admitting: Internal Medicine

## 2015-01-26 ENCOUNTER — Encounter: Payer: Self-pay | Admitting: Internal Medicine

## 2015-01-26 ENCOUNTER — Telehealth: Payer: Self-pay | Admitting: Internal Medicine

## 2015-01-26 VITALS — BP 118/60 | HR 71 | Temp 97.5°F | Ht 64.0 in | Wt 144.0 lb

## 2015-01-26 DIAGNOSIS — M25561 Pain in right knee: Secondary | ICD-10-CM | POA: Diagnosis not present

## 2015-01-26 DIAGNOSIS — Z23 Encounter for immunization: Secondary | ICD-10-CM

## 2015-01-26 DIAGNOSIS — E1165 Type 2 diabetes mellitus with hyperglycemia: Secondary | ICD-10-CM | POA: Diagnosis not present

## 2015-01-26 DIAGNOSIS — E785 Hyperlipidemia, unspecified: Secondary | ICD-10-CM

## 2015-01-26 DIAGNOSIS — Z0000 Encounter for general adult medical examination without abnormal findings: Secondary | ICD-10-CM

## 2015-01-26 DIAGNOSIS — M1990 Unspecified osteoarthritis, unspecified site: Secondary | ICD-10-CM

## 2015-01-26 DIAGNOSIS — K219 Gastro-esophageal reflux disease without esophagitis: Secondary | ICD-10-CM | POA: Diagnosis not present

## 2015-01-26 LAB — COMPREHENSIVE METABOLIC PANEL
ALK PHOS: 56 U/L (ref 39–117)
ALT: 27 U/L (ref 0–35)
AST: 26 U/L (ref 0–37)
Albumin: 4.3 g/dL (ref 3.5–5.2)
BILIRUBIN TOTAL: 0.4 mg/dL (ref 0.2–1.2)
BUN: 11 mg/dL (ref 6–23)
CO2: 32 mEq/L (ref 19–32)
CREATININE: 0.86 mg/dL (ref 0.40–1.20)
Calcium: 10.6 mg/dL — ABNORMAL HIGH (ref 8.4–10.5)
Chloride: 101 mEq/L (ref 96–112)
GFR: 71.18 mL/min (ref >60.00)
GLUCOSE: 168 mg/dL — AB (ref 70–99)
Potassium: 5.1 mEq/L (ref 3.5–5.1)
SODIUM: 139 meq/L (ref 135–145)
Total Protein: 7.3 g/dL (ref 6.0–8.3)

## 2015-01-26 LAB — CBC
HCT: 37.3 % (ref 36.0–46.0)
HEMOGLOBIN: 12.1 g/dL (ref 12.0–15.0)
MCHC: 32.5 g/dL (ref 30.0–36.0)
MCV: 90.1 fl (ref 78.0–100.0)
Platelets: 250 10*3/uL (ref 150.0–400.0)
RBC: 4.14 Mil/uL (ref 3.87–5.11)
RDW: 13.1 % (ref 11.5–15.5)
WBC: 8.4 10*3/uL (ref 4.0–10.5)

## 2015-01-26 LAB — LIPID PANEL
CHOLESTEROL: 168 mg/dL (ref 0–200)
HDL: 79.1 mg/dL (ref >39.00)
LDL Cholesterol: 73 mg/dL (ref 0–99)
NONHDL: 88.47
Total CHOL/HDL Ratio: 2
Triglycerides: 76 mg/dL (ref 0.0–149.0)
VLDL: 15.2 mg/dL (ref 0.0–40.0)

## 2015-01-26 LAB — MICROALBUMIN / CREATININE URINE RATIO
CREATININE, U: 44.9 mg/dL
MICROALB UR: 14.5 mg/dL — AB (ref 0.0–1.9)
Microalb Creat Ratio: 32.3 mg/g — ABNORMAL HIGH (ref 0.0–30.0)

## 2015-01-26 LAB — HEMOGLOBIN A1C: HEMOGLOBIN A1C: 8 % — AB (ref 4.6–6.5)

## 2015-01-26 MED ORDER — TRAMADOL HCL 50 MG PO TABS: 100.0000 mg | ORAL_TABLET | Freq: Three times a day (TID) | ORAL | Status: DC | PRN

## 2015-01-26 MED ORDER — GLUCOSE BLOOD VI STRP: ORAL_STRIP | Status: DC

## 2015-01-26 NOTE — Telephone Encounter (Signed)
The medicine you are calling in for pt today needs to go to the [facility] in [place]  [address] Phone # [phone]

## 2015-01-26 NOTE — Assessment & Plan Note (Signed)
Will check CMET and Lipid Profile today She will continue statin therapy Advised her to start ASA daily

## 2015-01-26 NOTE — Progress Notes (Signed)
Pre visit review using our clinic review tool, if applicable. No additional management support is needed unless otherwise documented below in the visit note. 

## 2015-01-26 NOTE — Assessment & Plan Note (Signed)
Well controlled on Prilosec Will check CMET today

## 2015-01-26 NOTE — Assessment & Plan Note (Signed)
Deteriorated, worse in knees Will check Xray of right knee today Continue Tramadol- refilled today

## 2015-01-26 NOTE — Addendum Note (Signed)
Addended by: Ellamae Sia on: 01/26/2015 03:07 PM   Modules accepted: Orders

## 2015-01-26 NOTE — Telephone Encounter (Signed)
This was already called in at her OV to [facility]

## 2015-01-26 NOTE — Patient Instructions (Signed)

## 2015-01-26 NOTE — Progress Notes (Signed)
Subjective:    Patient ID: Donna Moran, female    DOB: 1955-08-22, 59 y.o.   MRN: [id number]  HPI  Pt presents to the clinic today for her annual exam. She is also due for follow up of chronic conditions.  Flu: 12/2013 Tetanus: 09/2012 Pneumovax: 12/2013 Prevnar: never Zostovax: never, she is not positive that she had chicken pox Pap Smear:04/2011 Mammogram: 03/2014 Colon Screening: 04/2011 Vision Screening: yearly Dentist: no- dentures  Diet: She tries to consume a low fat diet. She does heat lots of fruits and veggies. She drinks mostly water. Exercise: She walks most every day.  Review of Systems      Past Medical History  Diagnosis Date  . Diabetes mellitus without complication   . Hyperlipidemia   . History of blood transfusion     Current Outpatient Prescriptions  Medication Sig Dispense Refill  . alendronate (FOSAMAX) 70 MG tablet TAKE 1 TABLET ONCE A WEEK. TAKE WITH A FULL GLASS OF WATER ON AN EMPTY STOMACH 12 tablet 0  . atorvastatin (LIPITOR) 20 MG tablet TAKE 1 TABLET DAILY 90 tablet 0  . FLUoxetine (PROZAC) 20 MG tablet Take 1 tablet (20 mg total) by mouth daily. 90 tablet 0  . gabapentin (NEURONTIN) 300 MG capsule Take 2 capsules (600 mg total) by mouth 3 (three) times daily. NEED TO RESCHEDULE ANNUAL PHYSICAL FOR MORE REFILLS 540 capsule 0  . glipiZIDE (GLUCOTROL) 10 MG tablet TAKE 1 TABLET TWO TIMES DAILY BEFORE MEALS 180 tablet 0  . glucose blood (FREESTYLE LITE) test strip TEST TWICE DAILY AS NEEDED 100 each 5  . linagliptin (TRADJENTA) 5 MG TABS tablet Take 1 tablet (5 mg total) by mouth daily. NO MORE REFILLS MUST MAKE ANNUAL PHYSICAL APPOINTMENT 90 tablet 0  . metFORMIN (GLUCOPHAGE) 1000 MG tablet TAKE 1 TABLET TWO TIMES DAILY WITH MEALS 180 tablet 0  . omeprazole (PRILOSEC) 40 MG capsule TAKE 1 CAPSULE DAILY 90 capsule 1  . traMADol (ULTRAM) 50 MG tablet Take 2 tablets (100 mg total) by mouth every 8 (eight) hours as needed. 240 tablet 0   No  current facility-administered medications for this visit.    Allergies  Allergen Reactions  . Aspirin Nausea Only    Can take coated aspirin    Family History  Problem Relation Age of Onset  . Cancer Neg Hx   . Diabetes Neg Hx   . Stroke Neg Hx     Social History   Social History  . Marital Status: Married    Spouse Name: N/A  . Number of Children: 2  . Years of Education: N/A   Occupational History  . Unemployed    Social History Main Topics  . Smoking status: Former Research scientist (life sciences)  . Smokeless tobacco: Never Used  . Alcohol Use: No  . Drug Use: No  . Sexual Activity: Yes   Other Topics Concern  . Not on file   Social History Narrative   Regular exercise-no   Caffeine Use-yes     Constitutional: Denies fever, malaise, fatigue, headache or abrupt weight changes.  HEENT: Denies eye pain, eye redness, ear pain, ringing in the ears, wax buildup, runny nose, nasal congestion, bloody nose, or sore throat. Respiratory: Denies difficulty breathing, shortness of breath, cough or sputum production.   Cardiovascular: Denies chest pain, chest tightness, palpitations or swelling in the hands or feet.  Gastrointestinal: Pt reports constipation. Denies abdominal pain, bloating, diarrhea or blood in the stool.  GU: Denies urgency, frequency, pain with urination, burning  sensation, blood in urine, odor or discharge. Musculoskeletal: Pt reports knee pain. Denies decrease in range of motion, difficulty with gait, muscle pain.  Skin: Denies redness, rashes, lesions or ulcercations.  Neurological: Denies dizziness, difficulty with memory, difficulty with speech or problems with balance and coordination.  Psych: Denies anxiety, depression, SI/HI.  No other specific complaints in a complete review of systems (except as listed in HPI above).  Objective:   Physical Exam  BP 118/60 mmHg  Pulse 71  Temp(Src) 97.5 F (36.4 C) (Tympanic)  Ht 5\' 4"  (1.626 m)  Wt 144 lb (65.318 kg)  BMI  24.71 kg/m2  SpO2 98% Wt Readings from Last 3 Encounters:  01/26/15 144 lb (65.318 kg)  04/05/14 144 lb (65.318 kg)  01/01/14 144 lb (65.318 kg)    General: Appears her stated age, well developed, well nourished in NAD. Skin: Warm, dry and intact. No rashes, lesions or ulcerations noted. HEENT: Head: normal shape and size; Eyes: sclera white, no icterus, conjunctiva pink, PERRLA and EOMs intact; Ears: Tm's gray and intact, normal light reflex;  Throat/Mouth: Teeth present, mucosa pink and moist, no exudate, lesions or ulcerations noted. Dentures noted. Neck:  Neck supple, trachea midline. No masses, lumps or thyromegaly present.  Cardiovascular: Normal rate and rhythm. S1,S2 noted.  No murmur, rubs or gallops noted. No JVD or BLE edema. No carotid bruits noted. Pulmonary/Chest: Normal effort and positive vesicular breath sounds. No respiratory distress. No wheezes, rales or ronchi noted.  Abdomen: Soft and nontender. Normal bowel sounds, no bruits noted. No distention or masses noted. Liver, spleen and kidneys non palpable. Musculoskeletal: Normal flexion and extension. Pain with palpation of the medial joint line of the right knee. No signs of joint swelling. She is walking with a limp.  Neurological: Alert and oriented. Cranial nerves II-XII grossly intact. Coordination normal.  Psychiatric: Mood and affect normal. Behavior is normal. Judgment and thought content normal.     BMET    Component Value Date/Time   NA 140 01/01/2014 1445   NA 142 01/01/2012 1336   K 4.6 01/01/2014 1445   K 4.0 01/01/2012 1336   CL 104 01/01/2014 1445   CL 104 01/01/2012 1336   CO2 29 01/01/2014 1445   CO2 28 01/01/2012 1336   GLUCOSE 93 01/01/2014 1445   GLUCOSE 128* 01/01/2012 1336   BUN 17 01/01/2014 1445   BUN 12.0 01/01/2012 1336   CREATININE 0.8 01/01/2014 1445   CREATININE 0.8 01/01/2012 1336   CALCIUM 9.8 01/01/2014 1445   CALCIUM 9.9 01/01/2012 1336    Lipid Panel     Component Value  Date/Time   CHOL 154 01/01/2014 1445   TRIG 54.0 01/01/2014 1445   HDL 62.60 01/01/2014 1445   CHOLHDL 2 01/01/2014 1445   VLDL 10.8 01/01/2014 1445   LDLCALC 81 01/01/2014 1445    CBC    Component Value Date/Time   WBC 9.1 01/01/2014 1445   WBC 7.0 01/01/2012 1336   RBC 4.24 01/01/2014 1445   RBC 4.72 01/01/2012 1336   HGB 12.7 01/01/2014 1445   HGB 13.8 01/01/2012 1336   HCT 37.7 01/01/2014 1445   HCT 42.0 01/01/2012 1336   PLT 178.0 01/01/2014 1445   PLT 219 01/01/2012 1336   MCV 88.9 01/01/2014 1445   MCV 89.0 01/01/2012 1336   MCH 29.2 01/01/2012 1336   MCHC 33.6 01/01/2014 1445   MCHC 32.9 01/01/2012 1336   RDW 12.9 01/01/2014 1445   RDW 12.2 01/01/2012 1336   LYMPHSABS  1.7 01/01/2012 1336   MONOABS 0.5 01/01/2012 1336   EOSABS 0.1 01/01/2012 1336   BASOSABS 0.1 01/01/2012 1336    Hgb A1C Lab Results  Component Value Date   HGBA1C 7.7* 04/05/2014         Assessment & Plan:   Preventative Health Maintenance:  Flu and Prevnar today Tetanus is UTD She denies Zostovax She will schedule mammogram for 03/2015 Pap smear UTD Colonoscopy UTD Encouraged her to see an eye doctor and dentist annually  RTC in 6 months or sooner if needed  HPI:  Pt presents to the clinic today for follow up of chronic conditions:  DM 2: Her last A1C was 7.7% She is on Metformin, Glipizide and Tradjenta. Microalbumin was done 12/2013. She reports her blood sugars range from 63-270. Her last eye exam 12/2013. Her flu and pneumonia shot were given 12/2013. She reports ongoing nerve pain in her feet but well controlled on Neurontin.   Arthritis: She has been having worsening knee pain. This started back in 06/2014 while she was trying to kick some ice off her porch. Her knee twisted at that time. She went to the ER where they gave her a knee brace. She takes Tramadol as prescribed, with good relief.  GAD: Well controlled on Prozac. She denies SI/HI.  GERD: Fair control on Prilosec.  She denies breakthrough symptoms.   HLD: Denies myalgias on Lipitor. She does try to consume a low fat diet.   Review of Systems:   Past Medical History  Diagnosis Date  . Diabetes mellitus without complication ([place])   . Hyperlipidemia   . History of blood transfusion     Current Outpatient Prescriptions  Medication Sig Dispense Refill  . alendronate (FOSAMAX) 70 MG tablet TAKE 1 TABLET ONCE A WEEK. TAKE WITH A FULL GLASS OF WATER ON AN EMPTY STOMACH 12 tablet 0  . atorvastatin (LIPITOR) 20 MG tablet TAKE 1 TABLET DAILY 90 tablet 0  . FLUoxetine (PROZAC) 20 MG tablet Take 1 tablet (20 mg total) by mouth daily. 90 tablet 0  . gabapentin (NEURONTIN) 300 MG capsule Take 2 capsules (600 mg total) by mouth 3 (three) times daily. NEED TO RESCHEDULE ANNUAL PHYSICAL FOR MORE REFILLS 540 capsule 0  . glipiZIDE (GLUCOTROL) 10 MG tablet TAKE 1 TABLET TWO TIMES DAILY BEFORE MEALS 180 tablet 0  . glucose blood (FREESTYLE LITE) test strip TEST TWICE DAILY AS NEEDED 100 each 5  . linagliptin (TRADJENTA) 5 MG TABS tablet Take 1 tablet (5 mg total) by mouth daily. NO MORE REFILLS MUST MAKE ANNUAL PHYSICAL APPOINTMENT 90 tablet 0  . metFORMIN (GLUCOPHAGE) 1000 MG tablet TAKE 1 TABLET TWO TIMES DAILY WITH MEALS 180 tablet 0  . omeprazole (PRILOSEC) 40 MG capsule TAKE 1 CAPSULE DAILY 90 capsule 1  . traMADol (ULTRAM) 50 MG tablet Take 2 tablets (100 mg total) by mouth every 8 (eight) hours as needed. 240 tablet 0   No current facility-administered medications for this visit.    Allergies  Allergen Reactions  . Aspirin Nausea Only    Can take coated aspirin    Family History  Problem Relation Age of Onset  . Cancer Neg Hx   . Diabetes Neg Hx   . Stroke Neg Hx     Social History   Social History  . Marital Status: Married    Spouse Name: N/A  . Number of Children: 2  . Years of Education: N/A   Occupational History  . Unemployed  Social History Main Topics  . Smoking status:  Former Research scientist (life sciences)  . Smokeless tobacco: Never Used  . Alcohol Use: No  . Drug Use: No  . Sexual Activity: Yes   Other Topics Concern  . Not on file   Social History Narrative   Regular exercise-no   Caffeine Use-yes     Constitutional: Denies fever, malaise, fatigue, headache or abrupt weight changes.  HEENT: Denies eye pain, eye redness, ear pain, ringing in the ears, wax buildup, runny nose, nasal congestion, bloody nose, or sore throat. Respiratory: Denies difficulty breathing, shortness of breath, cough or sputum production.   Cardiovascular: Denies chest pain, chest tightness, palpitations or swelling in the hands or feet.  Gastrointestinal: Pt reports constipation. Denies abdominal pain, bloating, diarrhea or blood in the stool.  GU: Denies urgency, frequency, pain with urination, burning sensation, blood in urine, odor or discharge. Musculoskeletal: Pt reports knee pain. Denies decrease in range of motion, difficulty with gait, muscle pain or joint swelling.  Skin: Denies redness, rashes, lesions or ulcercations.  Neurological: Denies dizziness, difficulty with memory, difficulty with speech or problems with balance and coordination.  Psych: Denies anxiety, depression, SI/HI.  No other specific complaints in a complete review of systems (except as listed in HPI above).  Objective: BP 118/60 mmHg  Pulse 71  Temp(Src) 97.5 F (36.4 C) (Tympanic)  Ht 5\' 4"  (1.626 m)  Wt 144 lb (65.318 kg)  BMI 24.71 kg/m2  SpO2 98%  General: Appears her stated age, well developed, well nourished in NAD. Skin: Warm, dry and intact. No rashes, lesions or ulcerations noted. HEENT: Head: normal shape and size; Eyes: sclera white, no icterus, conjunctiva pink, PERRLA and EOMs intact; Ears: Tm's gray and intact, normal light reflex;  Throat/Mouth: Teeth present, mucosa pink and moist, no exudate, lesions or ulcerations noted. Dentures noted. Neck:  Neck supple, trachea midline. No masses, lumps or  thyromegaly present.  Cardiovascular: Normal rate and rhythm. S1,S2 noted.  No murmur, rubs or gallops noted. No JVD or BLE edema. No carotid bruits noted. Pulmonary/Chest: Normal effort and positive vesicular breath sounds. No respiratory distress. No wheezes, rales or ronchi noted.  Abdomen: Soft and nontender. Normal bowel sounds, no bruits noted. No distention or masses noted. Liver, spleen and kidneys non palpable. Musculoskeletal: Normal flexion and extension. Pain with palpation of the medial joint line of the right knee. No signs of joint swelling. She is walking with a limp.  Neurological: Alert and oriented. Cranial nerves II-XII grossly intact. Coordination normal.  Psychiatric: Mood and affect normal. Behavior is normal. Judgment and thought content normal.    Assessment and Plan:  RTC in 6 months or sooner if needed

## 2015-01-26 NOTE — Assessment & Plan Note (Signed)
Will check A1C and Microalbumin today Flu and Prevnar today Pneumovax UTD Foot exam today She will continue current medications- we will adjust as needed Reinforced low carb diet Encouraged her to make an appt for a diabetic eye exam- she is past due

## 2015-01-26 NOTE — Telephone Encounter (Signed)
She came back to the office and request [place] send meds to [facility] in [place]. Please advise

## 2015-01-26 NOTE — Assessment & Plan Note (Signed)
Stable on Prozac Will check CMET today

## 2015-01-27 NOTE — Telephone Encounter (Signed)
Rx called in to pharmacy. 

## 2015-01-28 MED ORDER — LISINOPRIL 2.5 MG PO TABS: 2.5000 mg | ORAL_TABLET | Freq: Every day | ORAL | Status: DC

## 2015-01-28 NOTE — Addendum Note (Signed)
Addended by: Lurlean Nanny on: 01/28/2015 10:07 AM   Modules accepted: Orders

## 2015-01-28 NOTE — Telephone Encounter (Signed)
Pt called back and stated [facility] would not take her insurance so will need to call back into [facility]---advised pt I would switch Rx back to [facility]--Rx called into pharmacy

## 2015-02-01 ENCOUNTER — Other Ambulatory Visit: Payer: Self-pay | Admitting: Internal Medicine

## 2015-02-01 MED ORDER — INSULIN GLARGINE 100 UNIT/ML SOLOSTAR PEN: 14.0000 [IU] | PEN_INJECTOR | Freq: Every day | SUBCUTANEOUS | Status: DC

## 2015-02-19 ENCOUNTER — Other Ambulatory Visit: Payer: Self-pay | Admitting: Internal Medicine

## 2015-03-27 ENCOUNTER — Other Ambulatory Visit: Payer: Self-pay | Admitting: Internal Medicine

## 2015-03-28 NOTE — Telephone Encounter (Signed)
Is pt supposed to continue medication--please advise

## 2015-03-28 NOTE — Telephone Encounter (Signed)
Yes, pt should still be taking

## 2015-04-03 ENCOUNTER — Other Ambulatory Visit: Payer: Self-pay | Admitting: Internal Medicine

## 2015-04-25 ENCOUNTER — Other Ambulatory Visit: Payer: Self-pay | Admitting: Internal Medicine

## 2015-05-11 ENCOUNTER — Telehealth: Payer: Self-pay | Admitting: Internal Medicine

## 2015-05-11 NOTE — Telephone Encounter (Signed)
Patient is asking for Threasa Beards to call her back about her medication.

## 2015-05-16 NOTE — Telephone Encounter (Signed)
Pt has appt tomorrow

## 2015-05-17 ENCOUNTER — Ambulatory Visit (INDEPENDENT_AMBULATORY_CARE_PROVIDER_SITE_OTHER): Admitting: Internal Medicine

## 2015-05-17 ENCOUNTER — Ambulatory Visit (INDEPENDENT_AMBULATORY_CARE_PROVIDER_SITE_OTHER)
Admission: RE | Admit: 2015-05-17 | Discharge: 2015-05-17 | Disposition: A | Discharge status: Home / Self Care | Source: Ambulatory Visit | Attending: Internal Medicine | Admitting: Internal Medicine

## 2015-05-17 ENCOUNTER — Encounter: Payer: Self-pay | Admitting: Internal Medicine

## 2015-05-17 VITALS — BP 120/72 | HR 76 | Temp 98.4°F | Wt 146.0 lb

## 2015-05-17 DIAGNOSIS — M25561 Pain in right knee: Secondary | ICD-10-CM

## 2015-05-17 DIAGNOSIS — M25562 Pain in left knee: Secondary | ICD-10-CM

## 2015-05-17 DIAGNOSIS — L84 Corns and callosities: Secondary | ICD-10-CM | POA: Diagnosis not present

## 2015-05-17 DIAGNOSIS — E1142 Type 2 diabetes mellitus with diabetic polyneuropathy: Secondary | ICD-10-CM | POA: Diagnosis not present

## 2015-05-17 DIAGNOSIS — Z794 Long term (current) use of insulin: Secondary | ICD-10-CM | POA: Diagnosis not present

## 2015-05-17 DIAGNOSIS — M1990 Unspecified osteoarthritis, unspecified site: Secondary | ICD-10-CM | POA: Diagnosis not present

## 2015-05-17 LAB — HEMOGLOBIN A1C: HEMOGLOBIN A1C: 7.8 % — AB (ref 4.6–6.5)

## 2015-05-17 MED ORDER — TRAMADOL HCL 50 MG PO TABS: 100.0000 mg | ORAL_TABLET | Freq: Three times a day (TID) | ORAL | Status: DC | PRN

## 2015-05-17 NOTE — Progress Notes (Signed)
Subjective:    Patient ID: Donna Moran, female    DOB: 05-22-1955, 60 y.o.   MRN: [id number]  HPI  Pt presents today with c/o calluses on bilateral great toes. She says they are not painful and denies drainage. She states that she has sensation on the bottom and top of both feet. She does have diabetic nerve pain in both her feet that occasionally radiates into her lower leg. Denies numbness or tingling. She checks her blood sugars at home ranging from 120-175. She notes a blood sugar dropping to 32 yesterday an hour after taking her Lantus. She ate mashed potatoes, green beans and a hamburger patty before taking Lantus.    She is concerned that Lantus is causing weight gain and constipation. She says she has a bowel movement every other day for 4 weeks. She denies abdominal pain and blood in her stool. She has gained 2 pounds since her last visit 3 months ago. She is eating fruits and veggies daily. She says she consumes fiber. Denies consuming large amounts of fried foods or carbs. She says she wants to get a nutritionist to help with her diet. She has not been exercising, walks occasionally. Her last A1C on 01/26/15 was 8.0%. She was started on Lantus daily and Lisinopril 2.5mg  daily. She was continued on Glipizide, Tradjenta and Metformin.    She has been having worsening knee pain. This started back in 06/2014 while she was trying to kick some ice off her porch. Her right knee twisted at that time. She went to the ER where they gave her a knee brace. She wore the knee brace for two weeks and then she felt better so she removed the brace and the pain returned a week later. Her left knee then started hurting 3 weeks ago, she denies any trauma to left knee. She states it feels like her "knee caps are flipping over". Pain is currently maintained with Tramadol PRN.    Review of Systems  Past Medical History  Diagnosis Date  . Diabetes mellitus without complication ([place])   . Hyperlipidemia     . History of blood transfusion     Current Outpatient Prescriptions  Medication Sig Dispense Refill  . alendronate (FOSAMAX) 70 MG tablet TAKE 1 TABLET ONCE A WEEK. TAKE WITH A FULL GLASS OF WATER ON AN EMPTY STOMACH 12 tablet 1  . atorvastatin (LIPITOR) 20 MG tablet Take 1 tablet (20 mg total) by mouth daily. MUST SCHEDULE OFFICE VISIT FOR April 2017 90 tablet 1  . FLUoxetine (PROZAC) 20 MG tablet TAKE 1 TABLET DAILY 90 tablet 1  . gabapentin (NEURONTIN) 300 MG capsule Take 2 capsules (600 mg total) by mouth 3 (three) times daily. NEED TO RESCHEDULE ANNUAL PHYSICAL FOR MORE REFILLS 540 capsule 0  . glipiZIDE (GLUCOTROL) 10 MG tablet Take 1 tablet (10 mg total) by mouth 2 (two) times daily before a meal. MUST SCHEDULE OFFICE VISIT FOR April 2017 180 tablet 1  . glucose blood (FREESTYLE LITE) test strip TEST TWICE DAILY AS NEEDED 100 each 5  . Insulin Glargine (LANTUS) 100 UNIT/ML Solostar Pen Inject 14 Units into the skin daily at 10 pm. 15 mL 11  . linagliptin (TRADJENTA) 5 MG TABS tablet Take 1 tablet (5 mg total) by mouth daily. SCHEDULE FOLLOW UP OFFICE VISIT FOR April 2017 90 tablet 1  . lisinopril (PRINIVIL,ZESTRIL) 2.5 MG tablet Take 1 tablet (2.5 mg total) by mouth daily. 90 tablet 1  . metFORMIN (GLUCOPHAGE) 1000 MG tablet  Take 1 tablet (1,000 mg total) by mouth 2 (two) times daily with a meal. MUST SCHEDULE OFFICE VISIT FOR April 2017 180 tablet 1  . omeprazole (PRILOSEC) 40 MG capsule TAKE 1 CAPSULE DAILY 90 capsule 1  . traMADol (ULTRAM) 50 MG tablet Take 2 tablets (100 mg total) by mouth every 8 (eight) hours as needed. 240 tablet 0   No current facility-administered medications for this visit.    Allergies  Allergen Reactions  . Aspirin Nausea Only    Can take coated aspirin    Family History  Problem Relation Age of Onset  . Cancer Neg Hx   . Diabetes Neg Hx   . Stroke Neg Hx     Social History   Social History  . Marital Status: Married    Spouse Name: N/A  .  Number of Children: 2  . Years of Education: N/A   Occupational History  . Unemployed    Social History Main Topics  . Smoking status: Former Research scientist (life sciences)  . Smokeless tobacco: Never Used  . Alcohol Use: No  . Drug Use: No  . Sexual Activity: Yes   Other Topics Concern  . Not on file   Social History Narrative   Regular exercise-no   Caffeine Use-yes     Respiratory: Denies difficulty breathing, shortness of breath, cough or sputum production.   Cardiovascular: Denies chest pain, chest tightness, palpitations or swelling in the hands or feet.  Gastrointestinal: Positive constipation. Denies abdominal pain, diarrhea or blood in the stool.  Musculoskeletal: Bilateral knee pain. Denies decrease in range of motion or difficulty with gait. Skin: Positive calluses on bottom great toes bilaterally. Denies redness, rashes, lesions or ulcercations.    No other specific complaints in a complete review of systems (except as listed in HPI above).      Objective:   Physical Exam  BP 120/72 mmHg  Pulse 76  Temp(Src) 98.4 F (36.9 C) (Oral)  Wt 146 lb (66.225 kg)  SpO2 98% Wt Readings from Last 3 Encounters:  05/17/15 146 lb (66.225 kg)  01/26/15 144 lb (65.318 kg)  04/05/14 144 lb (65.318 kg)    General: Appears her stated age, in NAD. Skin: Warm, dry and intact. Calluses on bottom of great toes bilaterally. Tough, dry 1cm calluses with no erythema, fluid or ulceration.  Musculoskeletal: Normal range of motion in bilateral lower extremity. Negative McMurrays bilaterally, negative Lachmans bilaterally and negative vagus/valgus stress tests bilaterally. Negative for tenderness to palpation bilateral knees. No signs of joint swelling. No difficulty with gait.    BMET    Component Value Date/Time   NA 139 01/26/2015 1502   NA 142 01/01/2012 1336   K 5.1 01/26/2015 1502   K 4.0 01/01/2012 1336   CL 101 01/26/2015 1502   CL 104 01/01/2012 1336   CO2 32 01/26/2015 1502   CO2 28  01/01/2012 1336   GLUCOSE 168* 01/26/2015 1502   GLUCOSE 128* 01/01/2012 1336   BUN 11 01/26/2015 1502   BUN 12.0 01/01/2012 1336   CREATININE 0.86 01/26/2015 1502   CREATININE 0.8 01/01/2012 1336   CALCIUM 10.6* 01/26/2015 1502   CALCIUM 9.9 01/01/2012 1336    Lipid Panel     Component Value Date/Time   CHOL 168 01/26/2015 1502   TRIG 76.0 01/26/2015 1502   HDL 79.10 01/26/2015 1502   CHOLHDL 2 01/26/2015 1502   VLDL 15.2 01/26/2015 1502   LDLCALC 73 01/26/2015 1502    CBC    Component Value  Date/Time   WBC 8.4 01/26/2015 1502   WBC 7.0 01/01/2012 1336   RBC 4.14 01/26/2015 1502   RBC 4.72 01/01/2012 1336   HGB 12.1 01/26/2015 1502   HGB 13.8 01/01/2012 1336   HCT 37.3 01/26/2015 1502   HCT 42.0 01/01/2012 1336   PLT 250.0 01/26/2015 1502   PLT 219 01/01/2012 1336   MCV 90.1 01/26/2015 1502   MCV 89.0 01/01/2012 1336   MCH 29.2 01/01/2012 1336   MCHC 32.5 01/26/2015 1502   MCHC 32.9 01/01/2012 1336   RDW 13.1 01/26/2015 1502   RDW 12.2 01/01/2012 1336   LYMPHSABS 1.7 01/01/2012 1336   MONOABS 0.5 01/01/2012 1336   EOSABS 0.1 01/01/2012 1336   BASOSABS 0.1 01/01/2012 1336    Hgb A1C Lab Results  Component Value Date   HGBA1C 8.0* 01/26/2015         Assessment & Plan:  Foot Calluses:  Continue self-exam of feet Monitor calluses for any skin lesions or pain Educated on the danger of shaving down the callus if it causes a lesion that does not heal properly due to diabetic neuropathy of the feet  Bilateral Knee Pain:   Continue Tramadol PRN Xray of left knee today Order future MRI of right and left knee  Weight Gain:  Referral for a nutritionist  Discussed importance of low carb diet and exercise  DM2:  Continue Metformin, Lantus, Tradjenta and Glipizide A1C today Will call patient with results and treatment modification if needed  RTC in 3 months for follow up of chronic conditions.

## 2015-05-17 NOTE — Patient Instructions (Signed)
Blood Glucose Monitoring, Adult ° °Monitoring your blood glucose (also know as blood sugar) helps you to manage your diabetes. It also helps you and your health care provider monitor your diabetes and determine how well your treatment plan is working. °WHY SHOULD YOU MONITOR YOUR BLOOD GLUCOSE? °· It can help you understand how food, exercise, and medicine affect your blood glucose. °· It allows you to know what your blood glucose is at any given moment. You can quickly tell if you are having low blood glucose (hypoglycemia) or high blood glucose (hyperglycemia). °· It can help you and your health care provider know how to adjust your medicines. °· It can help you understand how to manage an illness or adjust medicine for exercise. °WHEN SHOULD YOU TEST? °Your health care provider will help you decide how often you should check your blood glucose. This may depend on the type of diabetes you have, your diabetes control, or the types of medicines you are taking. Be sure to write down all of your blood glucose readings so that this information can be reviewed with your health care provider. See below for examples of testing times that your health care provider may suggest. °Type 1 Diabetes °· Test at least 2 times per day if your diabetes is well controlled, if you are using an insulin pump, or if you perform multiple daily injections. °· If your diabetes is not well controlled or if you are sick, you may need to test more often. °· It is a good idea to also test: °¨ Before every insulin injection. °¨ Before and after exercise. °¨ Between meals and 2 hours after a meal. °¨ Occasionally between 2:00 a.m. and 3:00 a.m. °Type 2 Diabetes °· If you are taking insulin, test at least 2 times per day. However, it is best to test before every insulin injection. °· If you take medicines by mouth (orally), test 2 times a day. °· If you are on a controlled diet, test once a day. °· If your diabetes is not well controlled or if you  are sick, you may need to monitor more often. °HOW TO MONITOR YOUR BLOOD GLUCOSE °Supplies Needed °· Blood glucose meter. °· Test strips for your meter. Each meter has its own strips. You must use the strips that go with your own meter. °· A pricking needle (lancet). °· A device that holds the lancet (lancing device). °· A journal or log book to write down your results. °Procedure °· Wash your hands with soap and water. Alcohol is not preferred. °· Prick the side of your finger (not the tip) with the lancet. °· Gently milk the finger until a small drop of blood appears. °· Follow the instructions that come with your meter for inserting the test strip, applying blood to the strip, and using your blood glucose meter. °Other Areas to Get Blood for Testing °Some meters allow you to use other areas of your body (other than your finger) to test your blood. These areas are called alternative sites. The most common alternative sites are: °· The forearm. °· The thigh. °· The back area of the lower leg. °· The palm of the hand. °The blood flow in these areas is slower. Therefore, the blood glucose values you get may be delayed, and the numbers are different from what you would get from your fingers. Do not use alternative sites if you think you are having hypoglycemia. Your reading will not be accurate. Always use a finger if you   are having hypoglycemia. Also, if you cannot feel your lows (hypoglycemia unawareness), always use your fingers for your blood glucose checks. °ADDITIONAL TIPS FOR GLUCOSE MONITORING °· Do not reuse lancets. °· Always carry your supplies with you. °· All blood glucose meters have a 24-hour "hotline" number to call if you have questions or need help. °· Adjust (calibrate) your blood glucose meter with a control solution after finishing a few boxes of strips. °BLOOD GLUCOSE RECORD KEEPING °It is a good idea to keep a daily record or log of your blood glucose readings. Most glucose meters, if not all,  keep your glucose records stored in the meter. Some meters come with the ability to download your records to your home computer. Keeping a record of your blood glucose readings is especially helpful if you are wanting to look for patterns. Make notes to go along with the blood glucose readings because you might forget what happened at that exact time. Keeping good records helps you and your health care provider to work together to achieve good diabetes management.  °  °This information is not intended to replace advice given to you by your health care provider. Make sure you discuss any questions you have with your health care provider. °  °Document Released: 04/05/2003 Document Revised: 04/23/2014 Document Reviewed: 08/25/2012 °Elsevier Interactive Patient Education ©2016 [employer] ° °

## 2015-05-17 NOTE — Progress Notes (Signed)
Pre visit review using our clinic review tool, if applicable. No additional management support is needed unless otherwise documented below in the visit note. 

## 2015-06-03 ENCOUNTER — Ambulatory Visit
Admission: RE | Admit: 2015-06-03 | Discharge: 2015-06-03 | Disposition: A | Discharge status: Home / Self Care | Source: Ambulatory Visit | Attending: Internal Medicine | Admitting: Internal Medicine

## 2015-06-03 DIAGNOSIS — M2242 Chondromalacia patellae, left knee: Secondary | ICD-10-CM | POA: Diagnosis not present

## 2015-06-03 DIAGNOSIS — W19XXXA Unspecified fall, initial encounter: Secondary | ICD-10-CM | POA: Insufficient documentation

## 2015-06-03 DIAGNOSIS — M25562 Pain in left knee: Secondary | ICD-10-CM | POA: Diagnosis present

## 2015-06-03 DIAGNOSIS — M25561 Pain in right knee: Secondary | ICD-10-CM

## 2015-06-03 DIAGNOSIS — S83241A Other tear of medial meniscus, current injury, right knee, initial encounter: Secondary | ICD-10-CM | POA: Insufficient documentation

## 2015-06-03 DIAGNOSIS — S83204A Other tear of unspecified meniscus, current injury, left knee, initial encounter: Secondary | ICD-10-CM | POA: Diagnosis not present

## 2015-06-03 DIAGNOSIS — M25461 Effusion, right knee: Secondary | ICD-10-CM | POA: Diagnosis not present

## 2015-06-07 ENCOUNTER — Other Ambulatory Visit: Payer: Self-pay | Admitting: Internal Medicine

## 2015-06-07 DIAGNOSIS — M25562 Pain in left knee: Principal | ICD-10-CM

## 2015-06-07 DIAGNOSIS — M25561 Pain in right knee: Secondary | ICD-10-CM

## 2015-06-17 ENCOUNTER — Other Ambulatory Visit: Payer: Self-pay | Admitting: Internal Medicine

## 2015-07-10 ENCOUNTER — Other Ambulatory Visit: Payer: Self-pay | Admitting: Internal Medicine

## 2015-08-09 ENCOUNTER — Other Ambulatory Visit: Payer: Self-pay

## 2015-08-09 NOTE — Telephone Encounter (Signed)
Pt request refill Tradjenta; pt to contact [facility]; should have available refill; if problem have [facility] contact [place] and pt scheduled f/u appt on 08/16/15 at 2:15.

## 2015-08-09 NOTE — Telephone Encounter (Signed)
Did PA on [employer]. Received an approval but did not receive the parameters. They will contact the pharmacy.

## 2015-08-09 NOTE — Telephone Encounter (Signed)
Pt called [employer] and was advised prior auth needed for tradjenta and request call to [phone] to start PA. Pt request cb. Pt is completely out of Tradjenta.

## 2015-08-09 NOTE — Telephone Encounter (Signed)
Spoke to patient

## 2015-08-10 NOTE — Telephone Encounter (Signed)
Received fax from [facility]. Donna Moran has been approved through 04/15/2098

## 2015-08-16 ENCOUNTER — Ambulatory Visit: Admitting: Internal Medicine

## 2015-08-18 ENCOUNTER — Other Ambulatory Visit: Payer: Self-pay | Admitting: Internal Medicine

## 2015-09-10 ENCOUNTER — Other Ambulatory Visit: Payer: Self-pay | Admitting: Internal Medicine

## 2015-09-13 ENCOUNTER — Other Ambulatory Visit: Payer: Self-pay | Admitting: Internal Medicine

## 2015-09-13 NOTE — Telephone Encounter (Signed)
Please advise if pt is to continue medication 

## 2015-09-14 NOTE — Telephone Encounter (Signed)
Yes, should continue. Sent electronically

## 2015-10-01 ENCOUNTER — Other Ambulatory Visit: Payer: Self-pay | Admitting: Internal Medicine

## 2015-10-06 ENCOUNTER — Telehealth: Payer: Self-pay | Admitting: Internal Medicine

## 2015-10-06 DIAGNOSIS — M1990 Unspecified osteoarthritis, unspecified site: Secondary | ICD-10-CM

## 2015-10-06 MED ORDER — TRAMADOL HCL 50 MG PO TABS
100.0000 mg | ORAL_TABLET | Freq: Three times a day (TID) | ORAL | Status: DC | PRN
Start: 2015-10-06 — End: 2019-08-05

## 2015-10-06 NOTE — Telephone Encounter (Signed)
Ok to phone in Tramadol 

## 2015-10-06 NOTE — Telephone Encounter (Signed)
Pt called needing to get a refill on tramadol Pt stated she is completely out of her medication Please call pt when ready for pick up Pt didn't want to leave message on triage voice mail

## 2015-10-06 NOTE — Addendum Note (Signed)
Addended by: Helene Shoe on: 10/06/2015 05:24 PM   Modules accepted: Orders

## 2015-10-06 NOTE — Telephone Encounter (Signed)
Pt called and is out of med and wants med called today to [facility] in [place]. Medication phoned to Lennette Bihari at [facility] as instructed. Pt voiced understanding.

## 2015-10-07 NOTE — Telephone Encounter (Signed)
Letter mailed to pt as a reminder to schedule annual exam

## 2015-10-10 ENCOUNTER — Other Ambulatory Visit: Payer: Self-pay | Admitting: Internal Medicine

## 2015-10-10 NOTE — Telephone Encounter (Signed)
Can give 30 day supply, schedule her for an appt

## 2015-10-10 NOTE — Telephone Encounter (Signed)
Received refill electronically Last office visit 05/17/15, patient to follow-up in 3 months Appointment cancelled 08/16/15 Last refill note sent to patient needs office visit, no appointment scheduled

## 2015-10-20 ENCOUNTER — Other Ambulatory Visit: Payer: Self-pay | Admitting: Internal Medicine

## 2015-10-20 MED ORDER — OMEPRAZOLE 40 MG PO CPDR: 40.0000 mg | DELAYED_RELEASE_CAPSULE | Freq: Every day | ORAL | Status: DC

## 2015-10-20 MED ORDER — METFORMIN HCL 1000 MG PO TABS: 1000.0000 mg | ORAL_TABLET | Freq: Two times a day (BID) | ORAL | Status: DC

## 2015-10-20 MED ORDER — GLIPIZIDE 10 MG PO TABS: 10.0000 mg | ORAL_TABLET | Freq: Two times a day (BID) | ORAL | Status: DC

## 2015-10-20 MED ORDER — FLUOXETINE HCL 20 MG PO TABS: 20.0000 mg | ORAL_TABLET | Freq: Every day | ORAL | Status: DC

## 2015-10-20 MED ORDER — ATORVASTATIN CALCIUM 20 MG PO TABS: 20.0000 mg | ORAL_TABLET | Freq: Every day | ORAL | Status: DC

## 2015-10-20 MED ORDER — LISINOPRIL 2.5 MG PO TABS: 2.5000 mg | ORAL_TABLET | Freq: Every day | ORAL | Status: DC

## 2015-10-20 NOTE — Telephone Encounter (Signed)
Noted, medication refilled

## 2015-10-20 NOTE — Telephone Encounter (Signed)
I spoke to Donna Moran about missing her f/u appt---Donna Moran states she does not have a car at this time and is not sure when she can come for a f/u appt at this time.... I told Donna Moran how important it is to convey this information to [place] as it we will assume she just will not come in for an appt. I told Donna Moran that at this point her next CPE will be due in October. I advised Donna Moran to make sure she schedules an appt for her annual exam and if she can come in for a f/u before to call and schedule. Donna Moran expressed understanding when I told her that I would only refill her medication until October and will let you know that this was the reason she has not been able to come in---please advise if you are okay with what I advised Donna Moran

## 2015-12-07 ENCOUNTER — Other Ambulatory Visit: Payer: Self-pay | Admitting: Internal Medicine

## 2016-01-09 ENCOUNTER — Other Ambulatory Visit: Payer: Self-pay | Admitting: Internal Medicine

## 2016-02-10 ENCOUNTER — Telehealth: Payer: Self-pay | Admitting: Internal Medicine

## 2016-02-10 NOTE — Telephone Encounter (Signed)
noted 

## 2016-02-10 NOTE — Telephone Encounter (Signed)
Patient called to let Rollene Fare know she has moved to [place] and has a new doctor.

## 2016-06-11 ENCOUNTER — Other Ambulatory Visit: Payer: Self-pay | Admitting: Internal Medicine

## 2016-11-16 IMAGING — CR DG KNEE COMPLETE 4+V*R*
5 series · 5 of 5 positions shown · non-contrast
Comparison: Right knee series of June 14, 2013

CLINICAL DATA: Right medial knee pain for the past 7 months after
kicking ice.

EXAM:
RIGHT KNEE - COMPLETE 4+ VIEW

[view not recorded (1 of 5)]
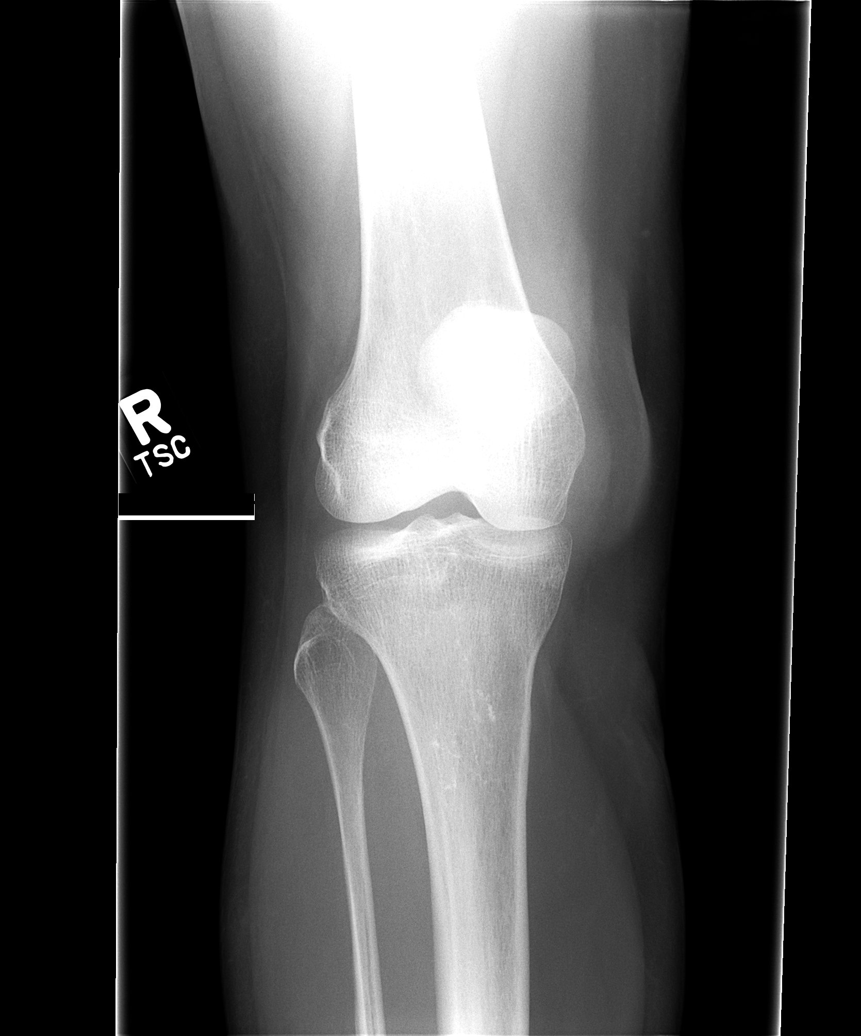

[view not recorded (2 of 5)]
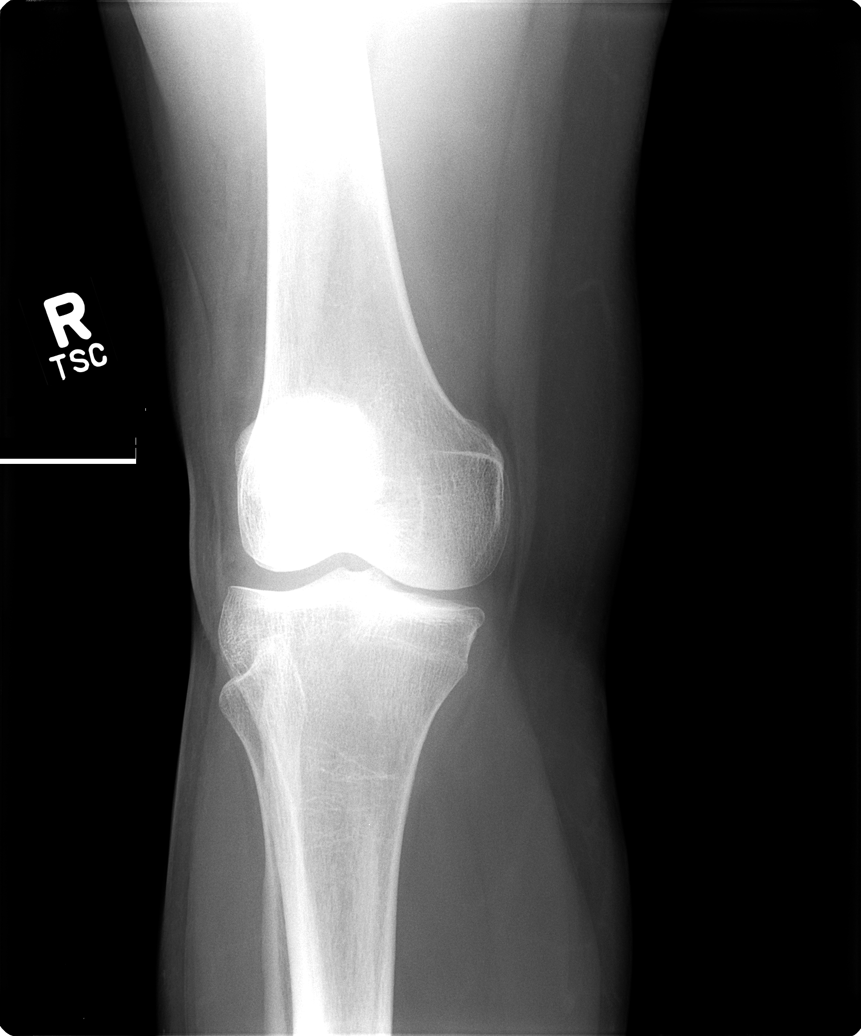

[view not recorded (3 of 5)]
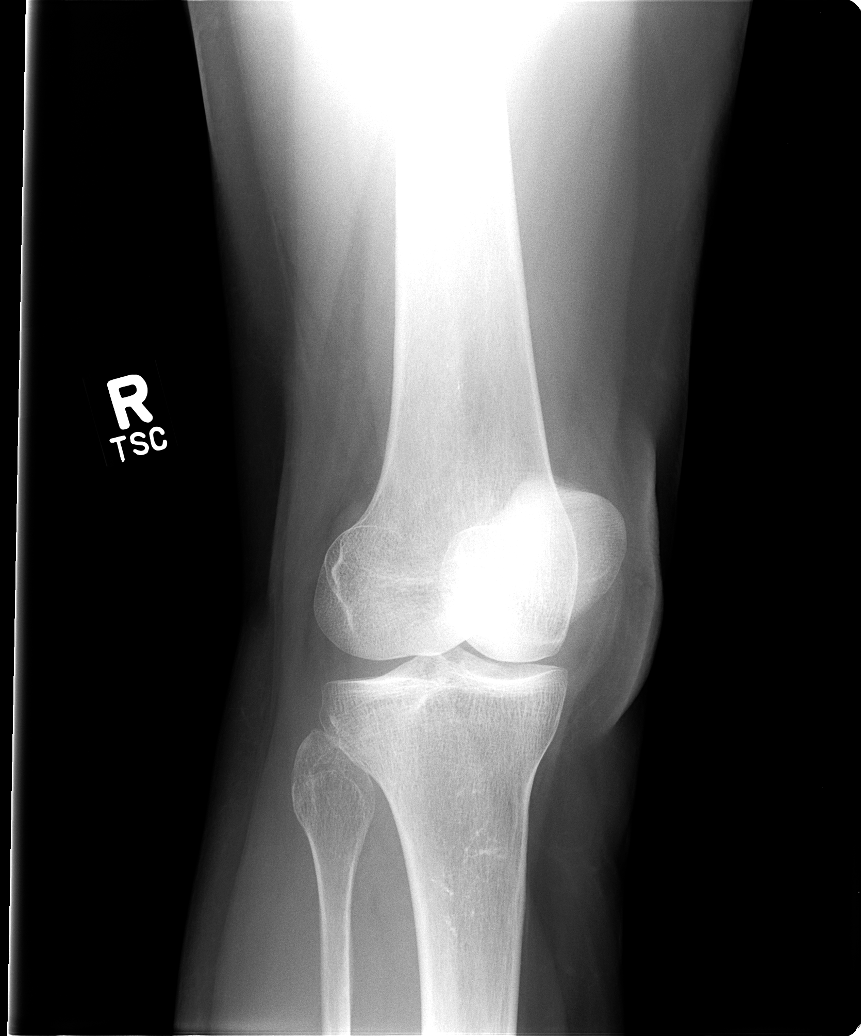

[view not recorded (4 of 5)]
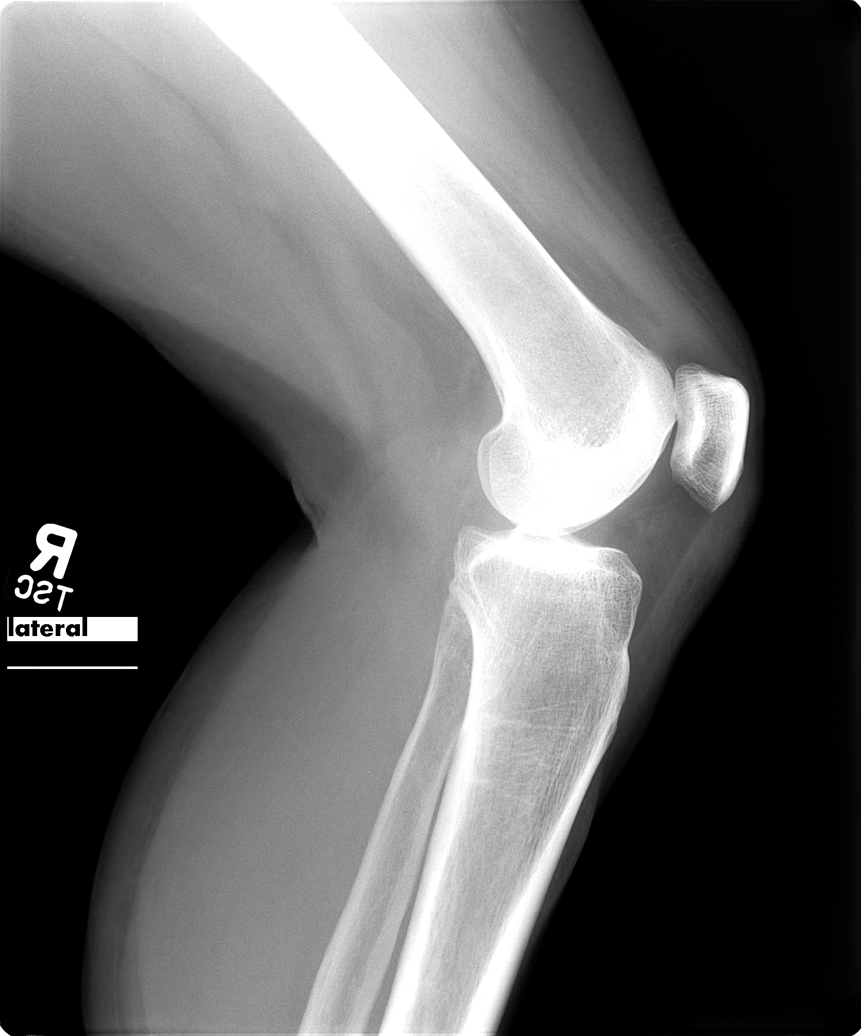

[view not recorded (5 of 5)]
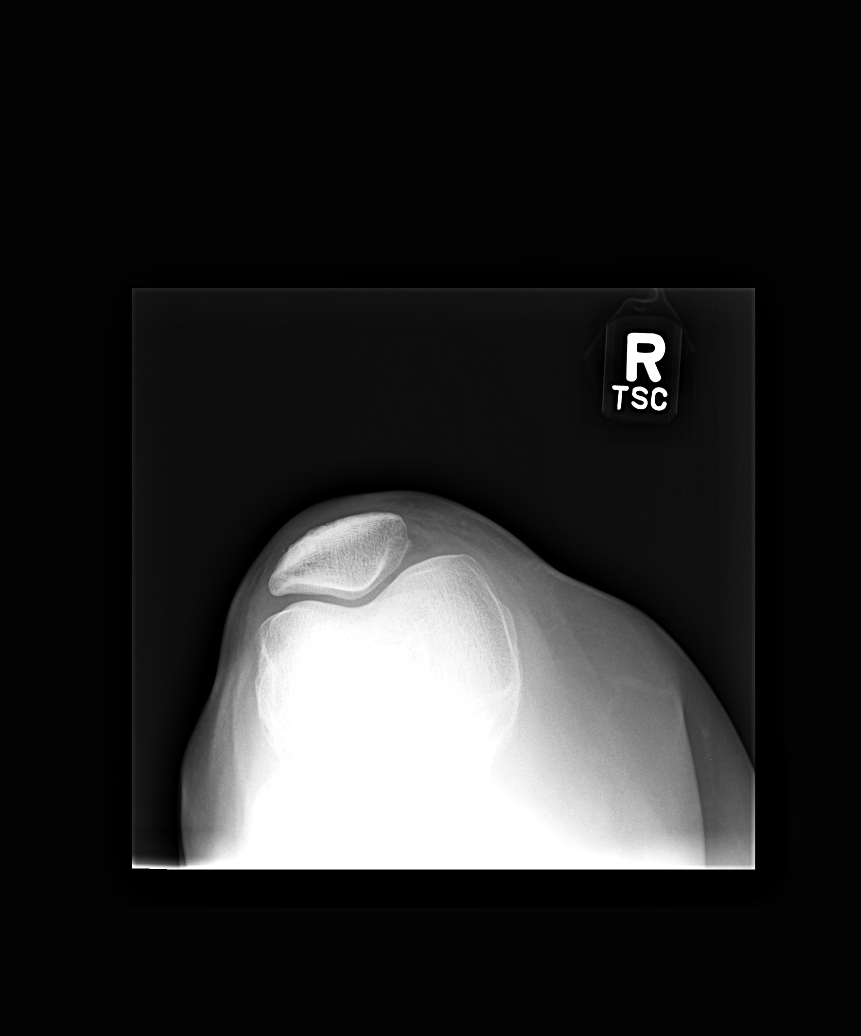

[5 of 5 positions shown; findings below may reference images not displayed]

FINDINGS: The bones are adequately mineralized. The joint spaces are
preserved. There is no chondrocalcinosis or joint effusion. There is
no acute fracture nor dislocation. The proximal fibula is intact.
IMPRESSION: There is no acute or significant chronic bony abnormality of the
right knee.

## 2017-03-05 ENCOUNTER — Other Ambulatory Visit: Payer: Self-pay | Admitting: Internal Medicine

## 2019-02-21 DIAGNOSIS — Z23 Encounter for immunization: Secondary | ICD-10-CM | POA: Diagnosis not present

## 2019-02-21 DIAGNOSIS — R270 Ataxia, unspecified: Secondary | ICD-10-CM | POA: Diagnosis not present

## 2019-02-21 DIAGNOSIS — N179 Acute kidney failure, unspecified: Secondary | ICD-10-CM | POA: Diagnosis not present

## 2019-02-21 DIAGNOSIS — R2681 Unsteadiness on feet: Secondary | ICD-10-CM | POA: Diagnosis not present

## 2019-02-21 DIAGNOSIS — G9341 Metabolic encephalopathy: Secondary | ICD-10-CM | POA: Diagnosis not present

## 2019-02-21 DIAGNOSIS — R29703 NIHSS score 3: Secondary | ICD-10-CM | POA: Diagnosis not present

## 2019-02-21 DIAGNOSIS — E1110 Type 2 diabetes mellitus with ketoacidosis without coma: Secondary | ICD-10-CM | POA: Diagnosis not present

## 2019-02-21 DIAGNOSIS — Z03818 Encounter for observation for suspected exposure to other biological agents ruled out: Secondary | ICD-10-CM | POA: Diagnosis not present

## 2019-02-21 DIAGNOSIS — R29701 NIHSS score 1: Secondary | ICD-10-CM | POA: Diagnosis not present

## 2019-02-21 DIAGNOSIS — E7800 Pure hypercholesterolemia, unspecified: Secondary | ICD-10-CM | POA: Diagnosis not present

## 2019-02-21 DIAGNOSIS — R4182 Altered mental status, unspecified: Secondary | ICD-10-CM | POA: Diagnosis not present

## 2019-02-21 DIAGNOSIS — I639 Cerebral infarction, unspecified: Secondary | ICD-10-CM | POA: Diagnosis not present

## 2019-02-21 DIAGNOSIS — R296 Repeated falls: Secondary | ICD-10-CM | POA: Diagnosis not present

## 2019-02-21 DIAGNOSIS — K219 Gastro-esophageal reflux disease without esophagitis: Secondary | ICD-10-CM | POA: Diagnosis not present

## 2019-02-24 DIAGNOSIS — R2681 Unsteadiness on feet: Secondary | ICD-10-CM | POA: Diagnosis not present

## 2019-02-24 DIAGNOSIS — Z23 Encounter for immunization: Secondary | ICD-10-CM | POA: Diagnosis not present

## 2019-02-24 DIAGNOSIS — R29703 NIHSS score 3: Secondary | ICD-10-CM | POA: Diagnosis not present

## 2019-02-24 DIAGNOSIS — R29701 NIHSS score 1: Secondary | ICD-10-CM | POA: Diagnosis not present

## 2019-02-24 DIAGNOSIS — G9341 Metabolic encephalopathy: Secondary | ICD-10-CM | POA: Diagnosis not present

## 2019-02-24 DIAGNOSIS — E7800 Pure hypercholesterolemia, unspecified: Secondary | ICD-10-CM | POA: Diagnosis not present

## 2019-02-24 DIAGNOSIS — K219 Gastro-esophageal reflux disease without esophagitis: Secondary | ICD-10-CM | POA: Diagnosis not present

## 2019-02-24 DIAGNOSIS — E1110 Type 2 diabetes mellitus with ketoacidosis without coma: Secondary | ICD-10-CM | POA: Diagnosis not present

## 2019-02-24 DIAGNOSIS — N179 Acute kidney failure, unspecified: Secondary | ICD-10-CM | POA: Diagnosis not present

## 2019-02-26 DIAGNOSIS — Z1159 Encounter for screening for other viral diseases: Secondary | ICD-10-CM | POA: Diagnosis not present

## 2019-02-26 DIAGNOSIS — Z794 Long term (current) use of insulin: Secondary | ICD-10-CM | POA: Diagnosis not present

## 2019-02-26 DIAGNOSIS — E0810 Diabetes mellitus due to underlying condition with ketoacidosis without coma: Secondary | ICD-10-CM | POA: Diagnosis not present

## 2019-02-26 DIAGNOSIS — N179 Acute kidney failure, unspecified: Secondary | ICD-10-CM | POA: Diagnosis not present

## 2019-02-26 DIAGNOSIS — Z23 Encounter for immunization: Secondary | ICD-10-CM | POA: Diagnosis not present

## 2019-02-27 DIAGNOSIS — I129 Hypertensive chronic kidney disease with stage 1 through stage 4 chronic kidney disease, or unspecified chronic kidney disease: Secondary | ICD-10-CM | POA: Diagnosis not present

## 2019-02-27 DIAGNOSIS — E1165 Type 2 diabetes mellitus with hyperglycemia: Secondary | ICD-10-CM | POA: Diagnosis not present

## 2019-02-27 DIAGNOSIS — E785 Hyperlipidemia, unspecified: Secondary | ICD-10-CM | POA: Diagnosis not present

## 2019-02-27 DIAGNOSIS — N182 Chronic kidney disease, stage 2 (mild): Secondary | ICD-10-CM | POA: Diagnosis not present

## 2019-02-27 DIAGNOSIS — R296 Repeated falls: Secondary | ICD-10-CM | POA: Diagnosis not present

## 2019-02-27 DIAGNOSIS — Z794 Long term (current) use of insulin: Secondary | ICD-10-CM | POA: Diagnosis not present

## 2019-02-27 DIAGNOSIS — K219 Gastro-esophageal reflux disease without esophagitis: Secondary | ICD-10-CM | POA: Diagnosis not present

## 2019-02-27 DIAGNOSIS — Z7984 Long term (current) use of oral hypoglycemic drugs: Secondary | ICD-10-CM | POA: Diagnosis not present

## 2019-02-27 DIAGNOSIS — R270 Ataxia, unspecified: Secondary | ICD-10-CM | POA: Diagnosis not present

## 2019-02-28 DIAGNOSIS — Z794 Long term (current) use of insulin: Secondary | ICD-10-CM | POA: Diagnosis not present

## 2019-02-28 DIAGNOSIS — N182 Chronic kidney disease, stage 2 (mild): Secondary | ICD-10-CM | POA: Diagnosis not present

## 2019-02-28 DIAGNOSIS — Z7984 Long term (current) use of oral hypoglycemic drugs: Secondary | ICD-10-CM | POA: Diagnosis not present

## 2019-02-28 DIAGNOSIS — R270 Ataxia, unspecified: Secondary | ICD-10-CM | POA: Diagnosis not present

## 2019-02-28 DIAGNOSIS — E785 Hyperlipidemia, unspecified: Secondary | ICD-10-CM | POA: Diagnosis not present

## 2019-02-28 DIAGNOSIS — R296 Repeated falls: Secondary | ICD-10-CM | POA: Diagnosis not present

## 2019-02-28 DIAGNOSIS — I129 Hypertensive chronic kidney disease with stage 1 through stage 4 chronic kidney disease, or unspecified chronic kidney disease: Secondary | ICD-10-CM | POA: Diagnosis not present

## 2019-02-28 DIAGNOSIS — K219 Gastro-esophageal reflux disease without esophagitis: Secondary | ICD-10-CM | POA: Diagnosis not present

## 2019-02-28 DIAGNOSIS — E1165 Type 2 diabetes mellitus with hyperglycemia: Secondary | ICD-10-CM | POA: Diagnosis not present

## 2019-03-04 DIAGNOSIS — I129 Hypertensive chronic kidney disease with stage 1 through stage 4 chronic kidney disease, or unspecified chronic kidney disease: Secondary | ICD-10-CM | POA: Diagnosis not present

## 2019-03-04 DIAGNOSIS — E1165 Type 2 diabetes mellitus with hyperglycemia: Secondary | ICD-10-CM | POA: Diagnosis not present

## 2019-03-04 DIAGNOSIS — K219 Gastro-esophageal reflux disease without esophagitis: Secondary | ICD-10-CM | POA: Diagnosis not present

## 2019-03-04 DIAGNOSIS — N182 Chronic kidney disease, stage 2 (mild): Secondary | ICD-10-CM | POA: Diagnosis not present

## 2019-03-04 DIAGNOSIS — Z794 Long term (current) use of insulin: Secondary | ICD-10-CM | POA: Diagnosis not present

## 2019-03-04 DIAGNOSIS — R296 Repeated falls: Secondary | ICD-10-CM | POA: Diagnosis not present

## 2019-03-04 DIAGNOSIS — R270 Ataxia, unspecified: Secondary | ICD-10-CM | POA: Diagnosis not present

## 2019-03-04 DIAGNOSIS — Z7984 Long term (current) use of oral hypoglycemic drugs: Secondary | ICD-10-CM | POA: Diagnosis not present

## 2019-03-04 DIAGNOSIS — E785 Hyperlipidemia, unspecified: Secondary | ICD-10-CM | POA: Diagnosis not present

## 2019-03-11 DIAGNOSIS — N182 Chronic kidney disease, stage 2 (mild): Secondary | ICD-10-CM | POA: Diagnosis not present

## 2019-03-11 DIAGNOSIS — R270 Ataxia, unspecified: Secondary | ICD-10-CM | POA: Diagnosis not present

## 2019-03-11 DIAGNOSIS — I129 Hypertensive chronic kidney disease with stage 1 through stage 4 chronic kidney disease, or unspecified chronic kidney disease: Secondary | ICD-10-CM | POA: Diagnosis not present

## 2019-03-11 DIAGNOSIS — Z794 Long term (current) use of insulin: Secondary | ICD-10-CM | POA: Diagnosis not present

## 2019-03-11 DIAGNOSIS — K219 Gastro-esophageal reflux disease without esophagitis: Secondary | ICD-10-CM | POA: Diagnosis not present

## 2019-03-11 DIAGNOSIS — E785 Hyperlipidemia, unspecified: Secondary | ICD-10-CM | POA: Diagnosis not present

## 2019-03-11 DIAGNOSIS — E1165 Type 2 diabetes mellitus with hyperglycemia: Secondary | ICD-10-CM | POA: Diagnosis not present

## 2019-03-11 DIAGNOSIS — Z7984 Long term (current) use of oral hypoglycemic drugs: Secondary | ICD-10-CM | POA: Diagnosis not present

## 2019-03-11 DIAGNOSIS — R296 Repeated falls: Secondary | ICD-10-CM | POA: Diagnosis not present

## 2019-03-18 DIAGNOSIS — N182 Chronic kidney disease, stage 2 (mild): Secondary | ICD-10-CM | POA: Diagnosis not present

## 2019-03-18 DIAGNOSIS — K219 Gastro-esophageal reflux disease without esophagitis: Secondary | ICD-10-CM | POA: Diagnosis not present

## 2019-03-18 DIAGNOSIS — E785 Hyperlipidemia, unspecified: Secondary | ICD-10-CM | POA: Diagnosis not present

## 2019-03-18 DIAGNOSIS — R270 Ataxia, unspecified: Secondary | ICD-10-CM | POA: Diagnosis not present

## 2019-03-18 DIAGNOSIS — Z7984 Long term (current) use of oral hypoglycemic drugs: Secondary | ICD-10-CM | POA: Diagnosis not present

## 2019-03-18 DIAGNOSIS — Z794 Long term (current) use of insulin: Secondary | ICD-10-CM | POA: Diagnosis not present

## 2019-03-18 DIAGNOSIS — I129 Hypertensive chronic kidney disease with stage 1 through stage 4 chronic kidney disease, or unspecified chronic kidney disease: Secondary | ICD-10-CM | POA: Diagnosis not present

## 2019-03-18 DIAGNOSIS — E1165 Type 2 diabetes mellitus with hyperglycemia: Secondary | ICD-10-CM | POA: Diagnosis not present

## 2019-03-18 DIAGNOSIS — R296 Repeated falls: Secondary | ICD-10-CM | POA: Diagnosis not present

## 2019-03-19 DIAGNOSIS — M25473 Effusion, unspecified ankle: Secondary | ICD-10-CM | POA: Diagnosis not present

## 2019-03-19 DIAGNOSIS — Z1159 Encounter for screening for other viral diseases: Secondary | ICD-10-CM | POA: Diagnosis not present

## 2019-03-19 DIAGNOSIS — E0810 Diabetes mellitus due to underlying condition with ketoacidosis without coma: Secondary | ICD-10-CM | POA: Diagnosis not present

## 2019-03-25 DIAGNOSIS — E785 Hyperlipidemia, unspecified: Secondary | ICD-10-CM | POA: Diagnosis not present

## 2019-03-25 DIAGNOSIS — K219 Gastro-esophageal reflux disease without esophagitis: Secondary | ICD-10-CM | POA: Diagnosis not present

## 2019-03-25 DIAGNOSIS — R270 Ataxia, unspecified: Secondary | ICD-10-CM | POA: Diagnosis not present

## 2019-03-25 DIAGNOSIS — I129 Hypertensive chronic kidney disease with stage 1 through stage 4 chronic kidney disease, or unspecified chronic kidney disease: Secondary | ICD-10-CM | POA: Diagnosis not present

## 2019-03-25 DIAGNOSIS — N182 Chronic kidney disease, stage 2 (mild): Secondary | ICD-10-CM | POA: Diagnosis not present

## 2019-03-25 DIAGNOSIS — E1165 Type 2 diabetes mellitus with hyperglycemia: Secondary | ICD-10-CM | POA: Diagnosis not present

## 2019-03-25 DIAGNOSIS — Z7984 Long term (current) use of oral hypoglycemic drugs: Secondary | ICD-10-CM | POA: Diagnosis not present

## 2019-03-25 DIAGNOSIS — Z794 Long term (current) use of insulin: Secondary | ICD-10-CM | POA: Diagnosis not present

## 2019-03-25 DIAGNOSIS — R296 Repeated falls: Secondary | ICD-10-CM | POA: Diagnosis not present

## 2019-04-02 DIAGNOSIS — K219 Gastro-esophageal reflux disease without esophagitis: Secondary | ICD-10-CM | POA: Diagnosis not present

## 2019-04-02 DIAGNOSIS — E1165 Type 2 diabetes mellitus with hyperglycemia: Secondary | ICD-10-CM | POA: Diagnosis not present

## 2019-04-02 DIAGNOSIS — R296 Repeated falls: Secondary | ICD-10-CM | POA: Diagnosis not present

## 2019-04-02 DIAGNOSIS — I129 Hypertensive chronic kidney disease with stage 1 through stage 4 chronic kidney disease, or unspecified chronic kidney disease: Secondary | ICD-10-CM | POA: Diagnosis not present

## 2019-04-02 DIAGNOSIS — Z7984 Long term (current) use of oral hypoglycemic drugs: Secondary | ICD-10-CM | POA: Diagnosis not present

## 2019-04-02 DIAGNOSIS — N182 Chronic kidney disease, stage 2 (mild): Secondary | ICD-10-CM | POA: Diagnosis not present

## 2019-04-02 DIAGNOSIS — E785 Hyperlipidemia, unspecified: Secondary | ICD-10-CM | POA: Diagnosis not present

## 2019-04-02 DIAGNOSIS — Z794 Long term (current) use of insulin: Secondary | ICD-10-CM | POA: Diagnosis not present

## 2019-04-02 DIAGNOSIS — R270 Ataxia, unspecified: Secondary | ICD-10-CM | POA: Diagnosis not present

## 2019-04-08 DIAGNOSIS — K219 Gastro-esophageal reflux disease without esophagitis: Secondary | ICD-10-CM | POA: Diagnosis not present

## 2019-04-08 DIAGNOSIS — Z7984 Long term (current) use of oral hypoglycemic drugs: Secondary | ICD-10-CM | POA: Diagnosis not present

## 2019-04-08 DIAGNOSIS — Z794 Long term (current) use of insulin: Secondary | ICD-10-CM | POA: Diagnosis not present

## 2019-04-08 DIAGNOSIS — R296 Repeated falls: Secondary | ICD-10-CM | POA: Diagnosis not present

## 2019-04-08 DIAGNOSIS — E785 Hyperlipidemia, unspecified: Secondary | ICD-10-CM | POA: Diagnosis not present

## 2019-04-08 DIAGNOSIS — E1165 Type 2 diabetes mellitus with hyperglycemia: Secondary | ICD-10-CM | POA: Diagnosis not present

## 2019-04-08 DIAGNOSIS — R270 Ataxia, unspecified: Secondary | ICD-10-CM | POA: Diagnosis not present

## 2019-04-08 DIAGNOSIS — I129 Hypertensive chronic kidney disease with stage 1 through stage 4 chronic kidney disease, or unspecified chronic kidney disease: Secondary | ICD-10-CM | POA: Diagnosis not present

## 2019-04-08 DIAGNOSIS — N182 Chronic kidney disease, stage 2 (mild): Secondary | ICD-10-CM | POA: Diagnosis not present

## 2019-04-15 DIAGNOSIS — Z7984 Long term (current) use of oral hypoglycemic drugs: Secondary | ICD-10-CM | POA: Diagnosis not present

## 2019-04-15 DIAGNOSIS — E785 Hyperlipidemia, unspecified: Secondary | ICD-10-CM | POA: Diagnosis not present

## 2019-04-15 DIAGNOSIS — E1165 Type 2 diabetes mellitus with hyperglycemia: Secondary | ICD-10-CM | POA: Diagnosis not present

## 2019-04-15 DIAGNOSIS — K219 Gastro-esophageal reflux disease without esophagitis: Secondary | ICD-10-CM | POA: Diagnosis not present

## 2019-04-15 DIAGNOSIS — I129 Hypertensive chronic kidney disease with stage 1 through stage 4 chronic kidney disease, or unspecified chronic kidney disease: Secondary | ICD-10-CM | POA: Diagnosis not present

## 2019-04-15 DIAGNOSIS — Z794 Long term (current) use of insulin: Secondary | ICD-10-CM | POA: Diagnosis not present

## 2019-04-15 DIAGNOSIS — R296 Repeated falls: Secondary | ICD-10-CM | POA: Diagnosis not present

## 2019-04-15 DIAGNOSIS — R270 Ataxia, unspecified: Secondary | ICD-10-CM | POA: Diagnosis not present

## 2019-04-15 DIAGNOSIS — N182 Chronic kidney disease, stage 2 (mild): Secondary | ICD-10-CM | POA: Diagnosis not present

## 2019-05-20 ENCOUNTER — Ambulatory Visit: Admitting: Legal Medicine

## 2019-06-23 DIAGNOSIS — Z23 Encounter for immunization: Secondary | ICD-10-CM | POA: Diagnosis not present

## 2019-07-03 ENCOUNTER — Other Ambulatory Visit: Payer: Self-pay | Admitting: Legal Medicine

## 2019-07-04 ENCOUNTER — Other Ambulatory Visit: Payer: Self-pay | Admitting: Legal Medicine

## 2019-07-04 DIAGNOSIS — E1142 Type 2 diabetes mellitus with diabetic polyneuropathy: Secondary | ICD-10-CM

## 2019-07-04 DIAGNOSIS — Z794 Long term (current) use of insulin: Secondary | ICD-10-CM

## 2019-07-04 MED ORDER — LANTUS SOLOSTAR 100 UNIT/ML ~~LOC~~ SOPN: 30.0000 [IU] | PEN_INJECTOR | Freq: Every day | SUBCUTANEOUS | 2 refills | Status: DC

## 2019-07-07 ENCOUNTER — Other Ambulatory Visit: Payer: Self-pay

## 2019-07-07 DIAGNOSIS — E1142 Type 2 diabetes mellitus with diabetic polyneuropathy: Secondary | ICD-10-CM

## 2019-07-07 DIAGNOSIS — Z794 Long term (current) use of insulin: Secondary | ICD-10-CM

## 2019-07-07 MED ORDER — LANTUS SOLOSTAR 100 UNIT/ML ~~LOC~~ SOPN
30.0000 [IU] | PEN_INJECTOR | Freq: Every day | SUBCUTANEOUS | 2 refills | Status: DC
Start: 2019-07-07 — End: 2019-07-07

## 2019-07-07 MED ORDER — LANTUS SOLOSTAR 100 UNIT/ML ~~LOC~~ SOPN: 30.0000 [IU] | PEN_INJECTOR | Freq: Every day | SUBCUTANEOUS | 2 refills | Status: DC

## 2019-07-10 ENCOUNTER — Other Ambulatory Visit: Payer: Self-pay

## 2019-07-10 DIAGNOSIS — E1142 Type 2 diabetes mellitus with diabetic polyneuropathy: Secondary | ICD-10-CM

## 2019-07-10 MED ORDER — GLUCOSE BLOOD VI STRP
1.0000 | ORAL_STRIP | 0 refills | Status: DC | PRN
Start: 2019-07-10 — End: 2019-09-03

## 2019-08-03 DIAGNOSIS — R0689 Other abnormalities of breathing: Secondary | ICD-10-CM | POA: Diagnosis not present

## 2019-08-03 DIAGNOSIS — I491 Atrial premature depolarization: Secondary | ICD-10-CM | POA: Diagnosis not present

## 2019-08-03 DIAGNOSIS — R404 Transient alteration of awareness: Secondary | ICD-10-CM | POA: Diagnosis not present

## 2019-08-03 DIAGNOSIS — E11649 Type 2 diabetes mellitus with hypoglycemia without coma: Secondary | ICD-10-CM | POA: Diagnosis not present

## 2019-08-03 DIAGNOSIS — E162 Hypoglycemia, unspecified: Secondary | ICD-10-CM | POA: Diagnosis not present

## 2019-08-03 DIAGNOSIS — Z794 Long term (current) use of insulin: Secondary | ICD-10-CM | POA: Diagnosis not present

## 2019-08-03 DIAGNOSIS — E161 Other hypoglycemia: Secondary | ICD-10-CM | POA: Diagnosis not present

## 2019-08-04 DIAGNOSIS — E161 Other hypoglycemia: Secondary | ICD-10-CM | POA: Diagnosis not present

## 2019-08-04 DIAGNOSIS — I10 Essential (primary) hypertension: Secondary | ICD-10-CM | POA: Diagnosis not present

## 2019-08-04 DIAGNOSIS — R61 Generalized hyperhidrosis: Secondary | ICD-10-CM | POA: Diagnosis not present

## 2019-08-04 DIAGNOSIS — E162 Hypoglycemia, unspecified: Secondary | ICD-10-CM | POA: Diagnosis not present

## 2019-08-04 DIAGNOSIS — Z794 Long term (current) use of insulin: Secondary | ICD-10-CM | POA: Diagnosis not present

## 2019-08-04 DIAGNOSIS — E11649 Type 2 diabetes mellitus with hypoglycemia without coma: Secondary | ICD-10-CM | POA: Diagnosis not present

## 2019-08-05 ENCOUNTER — Encounter: Payer: Self-pay | Admitting: Legal Medicine

## 2019-08-05 ENCOUNTER — Ambulatory Visit (INDEPENDENT_AMBULATORY_CARE_PROVIDER_SITE_OTHER): Payer: Medicare PPO | Admitting: Legal Medicine

## 2019-08-05 ENCOUNTER — Other Ambulatory Visit: Payer: Self-pay

## 2019-08-05 VITALS — BP 124/70 | HR 95 | Temp 97.3°F | Resp 16 | Ht 64.0 in | Wt 140.0 lb

## 2019-08-05 DIAGNOSIS — F320 Major depressive disorder, single episode, mild: Secondary | ICD-10-CM | POA: Diagnosis not present

## 2019-08-05 DIAGNOSIS — E782 Mixed hyperlipidemia: Secondary | ICD-10-CM

## 2019-08-05 DIAGNOSIS — E1142 Type 2 diabetes mellitus with diabetic polyneuropathy: Secondary | ICD-10-CM | POA: Diagnosis not present

## 2019-08-05 DIAGNOSIS — Z794 Long term (current) use of insulin: Secondary | ICD-10-CM | POA: Diagnosis not present

## 2019-08-05 DIAGNOSIS — I10 Essential (primary) hypertension: Secondary | ICD-10-CM | POA: Diagnosis not present

## 2019-08-05 DIAGNOSIS — E1165 Type 2 diabetes mellitus with hyperglycemia: Secondary | ICD-10-CM | POA: Insufficient documentation

## 2019-08-05 MED ORDER — LANTUS SOLOSTAR 100 UNIT/ML ~~LOC~~ SOPN: 15.0000 [IU] | PEN_INJECTOR | Freq: Every day | SUBCUTANEOUS | 2 refills | Status: DC

## 2019-08-05 MED ORDER — CITALOPRAM HYDROBROMIDE 20 MG PO TABS: 20.0000 mg | ORAL_TABLET | Freq: Every day | ORAL | 3 refills | Status: DC

## 2019-08-05 NOTE — Progress Notes (Signed)
Acute Office Visit  Subjective:    Patient ID: Donna Moran, female    DOB: 13-Oct-1955, 64 y.o.   MRN: [id number]  Chief Complaint  Patient presents with  . Diabetes    Low Blood sugars    HPI Patient is in today for hypoglycemia in AM several times.  She is on 25 units insulin taking   Patient present with type 2 diabetes.  Specifically, this is type 2, insulin requiring diabetes, complicated by neuropathy.  Compliance with treatment has been good; patient take medicines as directed, maintains diet and exercise regimen, follows up as directed, and is keeping glucose diary.  Date of  diagnosis 2010.  Depression screen has been performed.Tobacco screen nonsmoker. Current medicines for diabetes metformin, lntus 15units.  Patient is on lisinopril for renal praotection and atorvastatin for cholesterol control.  Patient performs foot exams daily and last ophthalmologic exam was 1 year  Patient presents for follow up of hypertension.  Patient tolerating lisinopril well with side effects.  Patient was diagnosed with hypertension 2010 so has been treated for hypertension for 10 years.Patient is working on maintaining diet and exercise regimen and follows up as directed. Complication include none  Patient presents with hyperlipidemia.  Compliance with treatment has been good; patient takes medicines as directed, maintains low cholesterol diet, follows up as directed, and maintains exercise regimen.  Patient is using atorvastatin without problems...  Past Medical History:  Diagnosis Date  . Diabetes mellitus without complication ([place])   . History of blood transfusion   . Hyperlipidemia     Past Surgical History:  Procedure Laterality Date  . CATARACT EXTRACTION Bilateral 04/2013-05/2013    Family History  Problem Relation Age of Onset  . Cancer Neg Hx   . Diabetes Neg Hx   . Stroke Neg Hx     Social History   Socioeconomic History  . Marital status: Married    Spouse name: Not  on file  . Number of children: 2  . Years of education: Not on file  . Highest education level: Not on file  Occupational History  . 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 Smoking status: Former Research scientist (life sciences)  . Smokeless tobacco: Never Used  Substance and Sexual Activity  . Alcohol use: No  . Drug use: No  . Sexual activity: Yes  Other Topics Concern  . Not on file  Social History Narrative   Regular exercise-no   Caffeine Use-yes   Social Determinants of Health   Financial Resource Strain:   . Difficulty of Paying Living Expenses:   Food Insecurity:   . Worried About Charity fundraiser in the Last Year:   . Arboriculturist in the Last Year:   Transportation Needs:   . Film/video editor (Medical):   Marland Kitchen Lack of Transportation (Non-Medical):   Physical Activity:   . Days of Exercise per Week:   . Minutes of Exercise per Session:   Stress:   . Feeling of Stress :   Social Connections:   . Frequency of Communication with Friends and Family:   . Frequency of Social Gatherings with Friends and Family:   . Attends Religious Services:   . Active Member of Clubs or Organizations:   . Attends Archivist Meetings:   Marland Kitchen Marital Status:   Intimate Partner Violence:   . Fear of Current or Ex-Partner:   . Emotionally Abused:   Marland Kitchen Physically Abused:   . Sexually  Abused:     Outpatient Medications Prior to Visit  Medication Sig Dispense Refill  . atorvastatin (LIPITOR) 40 MG tablet Take 40 mg by mouth at bedtime.    . B-D UF III MINI PEN NEEDLES 31G X 5 MM MISC Inject 1 each into the skin 3 (three) times daily.    . Blood Glucose Monitoring Suppl (ACCU-CHEK GUIDE) w/Device KIT USE TO CHECK GLUCOSE IN THE MORNING AND WITH MEALS    . gabapentin (NEURONTIN) 300 MG capsule Take 2 capsules (600 mg total) by mouth 3 (three) times daily. NEED TO RESCHEDULE ANNUAL PHYSICAL FOR MORE REFILLS 540 capsule 0  . glucose blood test strip 1 each by Other route  as needed. Use as instructed 100 each 0  . lisinopril (PRINIVIL,ZESTRIL) 2.5 MG tablet Take 1 tablet (2.5 mg total) by mouth daily. 90 tablet 0  . metFORMIN (GLUCOPHAGE) 1000 MG tablet TAKE 1 TABLET BY MOUTH TWICE DAILY 180 tablet 2  . omeprazole (PRILOSEC) 40 MG capsule Take 1 capsule (40 mg total) by mouth daily. 90 capsule 0  . insulin glargine (LANTUS SOLOSTAR) 100 UNIT/ML Solostar Pen Inject 30 Units into the skin daily. 15 mL 2  . TRADJENTA 5 MG TABS tablet TAKE 1 TABLET DAILY 90 tablet 0  . alendronate (FOSAMAX) 70 MG tablet TAKE 1 TABLET ONCE A WEEK. TAKE WITH A FULL GLASS OF WATER ON AN EMPTY STOMACH 12 tablet 0  . atorvastatin (LIPITOR) 20 MG tablet Take 1 tablet (20 mg total) by mouth daily at 6 PM. 90 tablet 0  . FLUoxetine (PROZAC) 20 MG tablet Take 1 tablet (20 mg total) by mouth daily. 90 tablet 0  . furosemide (LASIX) 20 MG tablet Take 20 mg by mouth daily.    Marland Kitchen glipiZIDE (GLUCOTROL) 10 MG tablet Take 1 tablet (10 mg total) by mouth 2 (two) times daily before a meal. 180 tablet 0  . glucose blood (FREESTYLE LITE) test strip TEST TWICE DAILY AS NEEDED 100 each 5  . torsemide (DEMADEX) 10 MG tablet TAKE 1 TABLET(10 MG) BY MOUTH EVERY DAY 90 tablet 2  . traMADol (ULTRAM) 50 MG tablet Take 2 tablets (100 mg total) by mouth every 8 (eight) hours as needed. 240 tablet 0   No facility-administered medications prior to visit.    Allergies  Allergen Reactions  . Aspirin Nausea Only    Can take coated aspirin    Review of Systems  Constitutional: Negative.   HENT: Negative.   Eyes: Negative.   Respiratory: Negative.   Cardiovascular: Negative.   Gastrointestinal: Negative.   Endocrine: Negative.   Genitourinary: Negative.   Musculoskeletal: Negative.   Skin: Negative.   Neurological: Negative.   Psychiatric/Behavioral: Negative.        Objective:    Physical Exam Vitals reviewed.  Constitutional:      Appearance: Normal appearance. She is normal weight.  HENT:      Head: Normocephalic and atraumatic.     Right Ear: Tympanic membrane, ear canal and external ear normal.     Left Ear: Tympanic membrane, ear canal and external ear normal.     Nose: Nose normal.     Mouth/Throat:     Mouth: Mucous membranes are dry.  Cardiovascular:     Rate and Rhythm: Normal rate and regular rhythm.     Pulses: Normal pulses.     Heart sounds: Normal heart sounds.  Pulmonary:     Effort: Pulmonary effort is normal.     Breath sounds: Normal  breath sounds.  Abdominal:     General: Abdomen is flat. Bowel sounds are normal.     Palpations: Abdomen is soft.  Musculoskeletal:        General: Normal range of motion.     Cervical back: Normal range of motion and neck supple.  Skin:    General: Skin is warm and dry.     Capillary Refill: Capillary refill takes less than 2 seconds.  Neurological:     General: No focal deficit present.     Mental Status: She is alert and oriented to person, place, and time.     BP 124/70   Pulse 95   Temp (!) 97.3 F (36.3 C)   Resp 16   Ht '5\' 4"'  (1.626 m)   Wt 140 lb (63.5 kg)   SpO2 97%   BMI 24.03 kg/m  Wt Readings from Last 3 Encounters:  08/05/19 140 lb (63.5 kg)  05/17/15 146 lb (66.2 kg)  01/26/15 144 lb (65.3 kg)    Health Maintenance Due  Topic Date Due  . Hepatitis C Screening  Never done  . OPHTHALMOLOGY EXAM  Never done  . HEMOGLOBIN A1C  11/14/2015  . MAMMOGRAM  03/25/2016  . PNA vac Low Risk Adult (2 of 2 - PPSV23) 01/02/2019  . COVID-19 Vaccine (2 - Pfizer 2-dose series) 07/14/2019    There are no preventive care reminders to display for this patient.   Lab Results  Component Value Date   TSH 0.67 10/07/2012   Lab Results  Component Value Date   WBC 8.4 01/26/2015   HGB 12.1 01/26/2015   HCT 37.3 01/26/2015   MCV 90.1 01/26/2015   PLT 250.0 01/26/2015   Lab Results  Component Value Date   NA 139 01/26/2015   K 5.1 01/26/2015   CO2 32 01/26/2015   GLUCOSE 168 (H) 01/26/2015   BUN 11  01/26/2015   CREATININE 0.86 01/26/2015   BILITOT 0.4 01/26/2015   ALKPHOS 56 01/26/2015   AST 26 01/26/2015   ALT 27 01/26/2015   PROT 7.3 01/26/2015   ALBUMIN 4.3 01/26/2015   CALCIUM 10.6 (H) 01/26/2015   GFR 71.18 01/26/2015   Lab Results  Component Value Date   CHOL 168 01/26/2015   Lab Results  Component Value Date   HDL 79.10 01/26/2015   Lab Results  Component Value Date   LDLCALC 73 01/26/2015   Lab Results  Component Value Date   TRIG 76.0 01/26/2015   Lab Results  Component Value Date   CHOLHDL 2 01/26/2015   Lab Results  Component Value Date   HGBA1C 7.8 (H) 05/17/2015       Assessment & Plan:   Problem List Items Addressed This Visit      Cardiovascular and Mediastinum   Essential hypertension, benign    An individual hypertension care plan was established and reinforced today.  The patient's status was assessed using clinical findings on exam and labs or diagnostic tests. The patient's success at meeting treatment goals on disease specific evidence-based guidelines and found to be well controlled. SELF MANAGEMENT: The patient and I together assessed ways to personally work towards obtaining the recommended goals. RECOMMENDATIONS: avoid decongestants found in common cold remedies, decrease consumption of alcohol, perform routine monitoring of BP with home BP cuff, exercise, reduction of dietary salt, take medicines as prescribed, try not to miss doses and quit smoking.  Regular exercise and maintaining a healthy weight is needed.  Stress reduction may help. A CLINICAL  SUMMARY including written plan identify barriers to care unique to individual due to social or financial issues.  We attempt to mutually creat solutions for individual and family understanding.      Relevant Medications   atorvastatin (LIPITOR) 40 MG tablet     Endocrine   Diabetic polyneuropathy ([place])    An individual care plan for diabetes was established and reinforced today.  The  patient's status was assessed using clinical findings on exam, labs and diagnostic testing. Patient success at meeting goals based on disease specific evidence-based guidelines and found to be fair controlled. She is having hypoglycemia, we will decrease insulin dose at night to 15 units. Medications were assessed and patient's understanding of the medical issues , including barriers were assessed. Recommend adherence to a diabetic diet, a graduated exercise program, HgbA1c level is checked quarterly, and urine microalbumin performed yearly .  Annual mono-filament sensation testing performed. Lower blood pressure and control hyperlipidemia is important. Get annual eye exams and annual flu shots and smoking cessation discussed.  Self management goals were discussed.      Relevant Medications   atorvastatin (LIPITOR) 40 MG tablet   insulin glargine (LANTUS SOLOSTAR) 100 UNIT/ML Solostar Pen   citalopram (CELEXA) 20 MG tablet   RESOLVED: DM type 2 (diabetes mellitus, type 2) (HCC) - Primary   Relevant Medications   atorvastatin (LIPITOR) 40 MG tablet   insulin glargine (LANTUS SOLOSTAR) 100 UNIT/ML Solostar Pen   Other Relevant Orders   CBC with Differential   Comprehensive metabolic panel   Hemoglobin A1c     Other   Mixed hyperlipidemia    AN INDIVIDUAL CARE PLAN for hyperlipidemia/ cholesterol was established and reinforced today.  The patient's status was assessed using clinical findings on exam, lab and other diagnostic tests. The patient's disease status was assessed based on evidence-based guidelines and found to be well controlled. MEDICATIONS were reviewed. SELF MANAGEMENT GOALS have been discussed and patient's success at attaining the goal of low cholesterol was assessed. RECOMMENDATION given include regular exercise 3 days a week and low cholesterol/low fat diet. CLINICAL SUMMARY including written plan to identify barriers unique to the patient due to social or economic  reasons was  discussed.      Relevant Medications   atorvastatin (LIPITOR) 40 MG tablet   Other Relevant Orders   Lipid Panel   Depression, major, single episode, mild ([place])    Patient's depression is uncontrolled with no medicines.   Anhedonia worse.  PHQ 9 was performed score 13. An individual care plan was established or reinforced today.  The patient's disease status was assessed using clinical findings on exam, labs, and or other diagnostic testing to determine patient's success in meeting treatment goals based on disease specifc evidence-based guidelines and found to be worsening Recommendations include change to celexa, follow up one month      Relevant Medications   citalopram (CELEXA) 20 MG tablet    Other Visit Diagnoses    Type 2 diabetes mellitus with diabetic polyneuropathy, with long-term current use of insulin (HCC)       Relevant Medications   atorvastatin (LIPITOR) 40 MG tablet   insulin glargine (LANTUS SOLOSTAR) 100 UNIT/ML Solostar Pen   citalopram (CELEXA) 20 MG tablet       Meds ordered this encounter  Medications  . insulin glargine (LANTUS SOLOSTAR) 100 UNIT/ML Solostar Pen    Sig: Inject 15 Units into the skin daily.    Dispense:  15 mL    Refill:  2  . citalopram (CELEXA) 20 MG tablet    Sig: Take 1 tablet (20 mg total) by mouth daily.    Dispense:  90 tablet    Refill:  3   Return in about 1 month (around 09/04/2019) for follow up on diabetes.  Reinaldo Meeker, MD

## 2019-08-05 NOTE — Assessment & Plan Note (Signed)
An individual care plan for diabetes was established and reinforced today.  The patient's status was assessed using clinical findings on exam, labs and diagnostic testing. Patient success at meeting goals based on disease specific evidence-based guidelines and found to be fair controlled. She is having hypoglycemia, we will decrease insulin dose at night to 15 units. Medications were assessed and patient's understanding of the medical issues , including barriers were assessed. Recommend adherence to a diabetic diet, a graduated exercise program, HgbA1c level is checked quarterly, and urine microalbumin performed yearly .  Annual mono-filament sensation testing performed. Lower blood pressure and control hyperlipidemia is important. Get annual eye exams and annual flu shots and smoking cessation discussed.  Self management goals were discussed.

## 2019-08-05 NOTE — Assessment & Plan Note (Signed)

## 2019-08-05 NOTE — Assessment & Plan Note (Signed)
Patient's depression is uncontrolled with no medicines.   Anhedonia worse.  PHQ 9 was performed score 13. An individual care plan was established or reinforced today.  The patient's disease status was assessed using clinical findings on exam, labs, and or other diagnostic testing to determine patient's success in meeting treatment goals based on disease specifc evidence-based guidelines and found to be worsening Recommendations include change to celexa, follow up one month

## 2019-08-05 NOTE — Assessment & Plan Note (Signed)

## 2019-08-06 LAB — COMPREHENSIVE METABOLIC PANEL
ALT: 14 IU/L (ref 0–32)
AST: 19 IU/L (ref 0–40)
Albumin/Globulin Ratio: 2.1 (ref 1.2–2.2)
Albumin: 4.4 g/dL (ref 3.8–4.8)
Alkaline Phosphatase: 61 IU/L (ref 39–117)
BUN/Creatinine Ratio: 18 (ref 12–28)
BUN: 30 mg/dL — ABNORMAL HIGH (ref 8–27)
Bilirubin Total: 0.3 mg/dL (ref 0.0–1.2)
CO2: 25 mmol/L (ref 20–29)
Calcium: 9.9 mg/dL (ref 8.7–10.3)
Chloride: 105 mmol/L (ref 96–106)
Creatinine, Ser: 1.65 mg/dL — ABNORMAL HIGH (ref 0.57–1.00)
GFR calc Af Amer: 37 mL/min/{1.73_m2} — ABNORMAL LOW (ref >59)
GFR calc non Af Amer: 32 mL/min/{1.73_m2} — ABNORMAL LOW (ref >59)
Globulin, Total: 2.1 g/dL (ref 1.5–4.5)
Glucose: 333 mg/dL — ABNORMAL HIGH (ref 65–99)
Potassium: 5.3 mmol/L — ABNORMAL HIGH (ref 3.5–5.2)
Sodium: 142 mmol/L (ref 134–144)
Total Protein: 6.5 g/dL (ref 6.0–8.5)

## 2019-08-06 LAB — CBC WITH DIFFERENTIAL/PLATELET
Basophils Absolute: 0.1 10*3/uL (ref 0.0–0.2)
Basos: 1 %
EOS (ABSOLUTE): 0.2 10*3/uL (ref 0.0–0.4)
Eos: 3 %
Hematocrit: 34.2 % (ref 34.0–46.6)
Hemoglobin: 11.2 g/dL (ref 11.1–15.9)
Immature Grans (Abs): 0 10*3/uL (ref 0.0–0.1)
Immature Granulocytes: 0 %
Lymphocytes Absolute: 1.5 10*3/uL (ref 0.7–3.1)
Lymphs: 21 %
MCH: 28.9 pg (ref 26.6–33.0)
MCHC: 32.7 g/dL (ref 31.5–35.7)
MCV: 88 fL (ref 79–97)
Monocytes Absolute: 0.6 10*3/uL (ref 0.1–0.9)
Monocytes: 8 %
Neutrophils Absolute: 4.7 10*3/uL (ref 1.4–7.0)
Neutrophils: 67 %
Platelets: 278 10*3/uL (ref 150–450)
RBC: 3.88 x10E6/uL (ref 3.77–5.28)
RDW: 13 % (ref 11.7–15.4)
WBC: 7 10*3/uL (ref 3.4–10.8)

## 2019-08-06 LAB — LIPID PANEL
Chol/HDL Ratio: 3.2 ratio (ref 0.0–4.4)
Cholesterol, Total: 276 mg/dL — ABNORMAL HIGH (ref 100–199)
HDL: 87 mg/dL (ref >39)
LDL Chol Calc (NIH): 168 mg/dL — ABNORMAL HIGH (ref 0–99)
Triglycerides: 123 mg/dL (ref 0–149)
VLDL Cholesterol Cal: 21 mg/dL (ref 5–40)

## 2019-08-06 LAB — HEMOGLOBIN A1C
Est. average glucose Bld gHb Est-mCnc: 146 mg/dL
Hgb A1c MFr Bld: 6.7 % — ABNORMAL HIGH (ref 4.8–5.6)

## 2019-08-06 LAB — CARDIOVASCULAR RISK ASSESSMENT

## 2019-08-06 NOTE — Progress Notes (Signed)
CBC normal, glucose 333 very high, kidney tests low stage 3b, potassium high 5.3, recheck  in one week, Cholesterol very high LDL 168- need to start a statin to lower cardiac risk, A1c 6.7 OK- take all medicines and stay on diet lp

## 2019-08-07 ENCOUNTER — Ambulatory Visit: Payer: Self-pay

## 2019-08-11 ENCOUNTER — Telehealth: Payer: Self-pay

## 2019-08-11 NOTE — Telephone Encounter (Signed)
Per Dr. Henrene Pastor patinet is to start giving lantus in the morning, Patients daughter will call back with results of trying this.

## 2019-08-11 NOTE — Telephone Encounter (Signed)
Patients daughter called in stating that patient is still having lows during the night, Patient takes lantus and metformin.  Patient a 1c was  6.7 last appointment. She is having to be woken up throughout the night to make sure she doesn't go too low.Please advise.

## 2019-08-14 ENCOUNTER — Other Ambulatory Visit: Payer: TRICARE For Life (TFL)

## 2019-08-14 DIAGNOSIS — E875 Hyperkalemia: Secondary | ICD-10-CM | POA: Diagnosis not present

## 2019-08-14 LAB — COMPREHENSIVE METABOLIC PANEL
ALT: 8 IU/L (ref 0–32)
AST: 18 IU/L (ref 0–40)
Albumin/Globulin Ratio: 2.2 (ref 1.2–2.2)
Albumin: 4.7 g/dL (ref 3.8–4.8)
Alkaline Phosphatase: 64 IU/L (ref 39–117)
BUN/Creatinine Ratio: 16 (ref 12–28)
BUN: 24 mg/dL (ref 8–27)
Bilirubin Total: 0.3 mg/dL (ref 0.0–1.2)
CO2: 20 mmol/L (ref 20–29)
Calcium: 10.7 mg/dL — ABNORMAL HIGH (ref 8.7–10.3)
Chloride: 105 mmol/L (ref 96–106)
Creatinine, Ser: 1.47 mg/dL — ABNORMAL HIGH (ref 0.57–1.00)
GFR calc Af Amer: 43 mL/min/{1.73_m2} — ABNORMAL LOW (ref >59)
GFR calc non Af Amer: 37 mL/min/{1.73_m2} — ABNORMAL LOW (ref >59)
Globulin, Total: 2.1 g/dL (ref 1.5–4.5)
Glucose: 117 mg/dL — ABNORMAL HIGH (ref 65–99)
Potassium: 5.5 mmol/L — ABNORMAL HIGH (ref 3.5–5.2)
Sodium: 143 mmol/L (ref 134–144)
Total Protein: 6.8 g/dL (ref 6.0–8.5)

## 2019-08-15 ENCOUNTER — Other Ambulatory Visit: Payer: Self-pay | Admitting: Legal Medicine

## 2019-08-15 DIAGNOSIS — E875 Hyperkalemia: Secondary | ICD-10-CM

## 2019-08-15 MED ORDER — SODIUM POLYSTYRENE SULFONATE PO POWD: Freq: Every morning | ORAL | 3 refills | Status: DC

## 2019-08-15 NOTE — Progress Notes (Signed)
Glucose 117, kidney tests stable but potassium higher at 5.5. I will call in kayexalate to lower potassium and recheck potassium one week lp

## 2019-08-18 ENCOUNTER — Other Ambulatory Visit: Payer: Self-pay

## 2019-08-18 DIAGNOSIS — E875 Hyperkalemia: Secondary | ICD-10-CM

## 2019-08-18 MED ORDER — SODIUM POLYSTYRENE SULFONATE PO POWD: Freq: Every morning | ORAL | 3 refills | Status: AC

## 2019-08-21 ENCOUNTER — Other Ambulatory Visit: Payer: TRICARE For Life (TFL)

## 2019-08-21 ENCOUNTER — Other Ambulatory Visit: Payer: Self-pay

## 2019-08-21 DIAGNOSIS — E875 Hyperkalemia: Secondary | ICD-10-CM | POA: Diagnosis not present

## 2019-08-21 LAB — COMPREHENSIVE METABOLIC PANEL
ALT: 16 IU/L (ref 0–32)
AST: 21 IU/L (ref 0–40)
Albumin/Globulin Ratio: 2 (ref 1.2–2.2)
Albumin: 4.5 g/dL (ref 3.8–4.8)
Alkaline Phosphatase: 46 IU/L (ref 39–117)
BUN/Creatinine Ratio: 18 (ref 12–28)
BUN: 40 mg/dL — ABNORMAL HIGH (ref 8–27)
Bilirubin Total: 0.4 mg/dL (ref 0.0–1.2)
CO2: 22 mmol/L (ref 20–29)
Calcium: 9.7 mg/dL (ref 8.7–10.3)
Chloride: 106 mmol/L (ref 96–106)
Creatinine, Ser: 2.25 mg/dL — ABNORMAL HIGH (ref 0.57–1.00)
GFR calc Af Amer: 25 mL/min/{1.73_m2} — ABNORMAL LOW (ref >59)
GFR calc non Af Amer: 22 mL/min/{1.73_m2} — ABNORMAL LOW (ref >59)
Globulin, Total: 2.2 g/dL (ref 1.5–4.5)
Glucose: 145 mg/dL — ABNORMAL HIGH (ref 65–99)
Potassium: 4.6 mmol/L (ref 3.5–5.2)
Sodium: 146 mmol/L — ABNORMAL HIGH (ref 134–144)
Total Protein: 6.7 g/dL (ref 6.0–8.5)

## 2019-08-22 NOTE — Progress Notes (Signed)
Potasium is normal but kidney tests still up, has she been referred to nephrology.? lp

## 2019-08-24 ENCOUNTER — Other Ambulatory Visit: Payer: Self-pay | Admitting: Legal Medicine

## 2019-08-24 DIAGNOSIS — N184 Chronic kidney disease, stage 4 (severe): Secondary | ICD-10-CM

## 2019-08-24 DIAGNOSIS — E1121 Type 2 diabetes mellitus with diabetic nephropathy: Secondary | ICD-10-CM

## 2019-08-24 NOTE — Progress Notes (Signed)
I sent in request for nephrology consult lp

## 2019-09-03 ENCOUNTER — Other Ambulatory Visit: Payer: Self-pay | Admitting: Legal Medicine

## 2019-09-03 ENCOUNTER — Other Ambulatory Visit: Payer: Self-pay

## 2019-09-03 ENCOUNTER — Ambulatory Visit (INDEPENDENT_AMBULATORY_CARE_PROVIDER_SITE_OTHER): Payer: Medicare PPO | Admitting: Legal Medicine

## 2019-09-03 ENCOUNTER — Encounter: Payer: Self-pay | Admitting: Legal Medicine

## 2019-09-03 VITALS — BP 104/60 | HR 80 | Temp 97.6°F | Resp 18 | Ht 64.0 in | Wt 130.4 lb

## 2019-09-03 DIAGNOSIS — I10 Essential (primary) hypertension: Secondary | ICD-10-CM | POA: Diagnosis not present

## 2019-09-03 DIAGNOSIS — E1121 Type 2 diabetes mellitus with diabetic nephropathy: Secondary | ICD-10-CM | POA: Insufficient documentation

## 2019-09-03 DIAGNOSIS — F411 Generalized anxiety disorder: Secondary | ICD-10-CM

## 2019-09-03 DIAGNOSIS — E1142 Type 2 diabetes mellitus with diabetic polyneuropathy: Secondary | ICD-10-CM

## 2019-09-03 MED ORDER — GLUCOSE BLOOD VI STRP: 1.0000 | ORAL_STRIP | 0 refills | Status: DC | PRN

## 2019-09-03 MED ORDER — BD PEN NEEDLE MINI U/F 31G X 5 MM MISC: 1.0000 | Freq: Three times a day (TID) | 2 refills | Status: DC

## 2019-09-03 MED ORDER — LANCETS MISC: 1.0000 | Freq: Every day | 2 refills | Status: DC

## 2019-09-03 NOTE — Progress Notes (Signed)
Established Patient Office Visit  Subjective:  Patient ID: Donna Moran, female    DOB: 08-31-1955  Age: 64 y.o. MRN: [id number]  CC:  Chief Complaint  Patient presents with  . Diabetes  . Anxiety    HPI Donna Moran presents for diabetes.  Patient present with type 2 diabetes.  Specifically, this is type 2, insulin  requiring diabetes, complicated by polyneuropathy and nephropathy.  Compliance with treatment has been good; patient take medicines as directed, maintains diet and exercise regimen, follows up as directed, and is keeping glucose diary.  Date of  diagnosis 2020.  Depression screen has been performed.Tobacco screen nonsmoker. Current medicines for diabetes insulin, metformin.  Patient is on lisinopril for renal protection and atorvastatin for cholesterol control.  Patient performs foot exams daily and last ophthalmologic exam was not yet.  Past Medical History:  Diagnosis Date  . Diabetes mellitus without complication ([place])   . History of blood transfusion   . Hyperlipidemia     Past Surgical History:  Procedure Laterality Date  . CATARACT EXTRACTION Bilateral 04/2013-05/2013    Family History  Problem Relation Age of Onset  . Cancer Neg Hx   . Diabetes Neg Hx   . Stroke Neg Hx     Social History   Socioeconomic History  . Marital status: Married    Spouse name: Not on file  . Number of children: 2  . Years of education: Not on file  . Highest education level: Not on file  Occupational History  . 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 Smoking status: Former Research scientist (life sciences)  . Smokeless tobacco: Never Used  Substance and Sexual Activity  . Alcohol use: No  . Drug use: No  . Sexual activity: Yes  Other Topics Concern  . Not on file  Social History Narrative   Regular exercise-no   Caffeine Use-yes   Social Determinants of Health   Financial Resource Strain:   . Difficulty of Paying Living Expenses:   Food Insecurity:    . Worried About Charity fundraiser in the Last Year:   . Arboriculturist in the Last Year:   Transportation Needs:   . Film/video editor (Medical):   Marland Kitchen Lack of Transportation (Non-Medical):   Physical Activity:   . Days of Exercise per Week:   . Minutes of Exercise per Session:   Stress:   . Feeling of Stress :   Social Connections:   . Frequency of Communication with Friends and Family:   . Frequency of Social Gatherings with Friends and Family:   . Attends Religious Services:   . Active Member of Clubs or Organizations:   . Attends Archivist Meetings:   Marland Kitchen Marital Status:   Intimate Partner Violence:   . Fear of Current or Ex-Partner:   . Emotionally Abused:   Marland Kitchen Physically Abused:   . Sexually Abused:     Outpatient Medications Prior to Visit  Medication Sig Dispense Refill  . atorvastatin (LIPITOR) 40 MG tablet Take 40 mg by mouth at bedtime.    . Blood Glucose Monitoring Suppl (ACCU-CHEK GUIDE) w/Device KIT USE TO CHECK GLUCOSE IN THE MORNING AND WITH MEALS    . citalopram (CELEXA) 20 MG tablet Take 1 tablet (20 mg total) by mouth daily. 90 tablet 3  . gabapentin (NEURONTIN) 300 MG capsule Take 2 capsules (600 mg total) by mouth 3 (three) times daily. NEED TO RESCHEDULE ANNUAL PHYSICAL FOR MORE  REFILLS 540 capsule 0  . insulin glargine (LANTUS SOLOSTAR) 100 UNIT/ML Solostar Pen Inject 15 Units into the skin daily. 15 mL 2  . lisinopril (PRINIVIL,ZESTRIL) 2.5 MG tablet Take 1 tablet (2.5 mg total) by mouth daily. 90 tablet 0  . metFORMIN (GLUCOPHAGE) 1000 MG tablet TAKE 1 TABLET BY MOUTH TWICE DAILY 180 tablet 2  . omeprazole (PRILOSEC) 40 MG capsule Take 1 capsule (40 mg total) by mouth daily. 90 capsule 0  . SPS 15 GM/60ML suspension SMARTSIG:60 Milliliter(s) By Mouth Daily    . B-D UF III MINI PEN NEEDLES 31G X 5 MM MISC Inject 1 each into the skin 3 (three) times daily.    Marland Kitchen glucose blood test strip 1 each by Other route as needed. Use as instructed 100  each 0   No facility-administered medications prior to visit.    Allergies  Allergen Reactions  . Aspirin Nausea Only    Can take coated aspirin    ROS Review of Systems  Constitutional: Negative.   HENT: Negative.   Respiratory: Negative.   Cardiovascular: Negative.   Gastrointestinal: Negative.   Genitourinary: Negative.   Neurological: Negative.       Objective:    Physical Exam  Constitutional: She appears well-developed and well-nourished.  HENT:  Head: Normocephalic and atraumatic.  Right Ear: External ear normal.  Left Ear: External ear normal.  Nose: Nose normal.  Mouth/Throat: Oropharynx is clear and moist.  Eyes: Pupils are equal, round, and reactive to light. Conjunctivae and EOM are normal.  Cardiovascular: Normal rate, regular rhythm, normal heart sounds and intact distal pulses.  Pulmonary/Chest: Effort normal and breath sounds normal.  Vitals reviewed.   BP 104/60   Pulse 80   Temp 97.6 F (36.4 C)   Resp 18   Ht _0  (1.626 m)   Wt 130 lb 6.4 oz (59.1 kg)   BMI 22.38 kg/m  Wt Readings from Last 3 Encounters:  09/03/19 130 lb 6.4 oz (59.1 kg)  08/05/19 140 lb (63.5 kg)  05/17/15 146 lb (66.2 kg)     Health Maintenance Due  Topic Date Due  . Hepatitis C Screening  Never done  . OPHTHALMOLOGY EXAM  Never done  . MAMMOGRAM  03/25/2016  . PNA vac Low Risk Adult (2 of 2 - PPSV23) 01/02/2019  . COVID-19 Vaccine (2 - Pfizer 2-dose series) 07/14/2019    There are no preventive care reminders to display for this patient.  Lab Results  Component Value Date   TSH 0.67 10/07/2012   Lab Results  Component Value Date   WBC 7.0 08/05/2019   HGB 11.2 08/05/2019   HCT 34.2 08/05/2019   MCV 88 08/05/2019   PLT 278 08/05/2019   Lab Results  Component Value Date   NA 146 (H) 08/21/2019   K 4.6 08/21/2019   CO2 22 08/21/2019   GLUCOSE 145 (H) 08/21/2019   BUN 40 (H) 08/21/2019   CREATININE 2.25 (H) 08/21/2019   BILITOT 0.4 08/21/2019    ALKPHOS 46 08/21/2019   AST 21 08/21/2019   ALT 16 08/21/2019   PROT 6.7 08/21/2019   ALBUMIN 4.5 08/21/2019   CALCIUM 9.7 08/21/2019   GFR 71.18 01/26/2015   Lab Results  Component Value Date   CHOL 276 (H) 08/05/2019   Lab Results  Component Value Date   HDL 87 08/05/2019   Lab Results  Component Value Date   LDLCALC 168 (H) 08/05/2019   Lab Results  Component Value Date  TRIG 123 08/05/2019   Lab Results  Component Value Date   CHOLHDL 3.2 08/05/2019   Lab Results  Component Value Date   HGBA1C 6.7 (H) 08/05/2019      Assessment & Plan:   Problem List Items Addressed This Visit      Cardiovascular and Mediastinum   Essential hypertension, benign - Primary    An individual hypertension care plan was established and reinforced today.  The patient's status was assessed using clinical findings on exam and labs or diagnostic tests. The patient's success at meeting treatment goals on disease specific evidence-based guidelines and found to be well controlled. SELF MANAGEMENT: The patient and I together assessed ways to personally work towards obtaining the recommended goals. RECOMMENDATIONS: avoid decongestants found in common cold remedies, decrease consumption of alcohol, perform routine monitoring of BP with home BP cuff, exercise, reduction of dietary salt, take medicines as prescribed, try not to miss doses and quit smoking.  Regular exercise and maintaining a healthy weight is needed.  Stress reduction may help. A CLINICAL SUMMARY including written plan identify barriers to care unique to individual due to social or financial issues.  We attempt to mutually creat solutions for individual and family understanding.      Relevant Orders   Comprehensive metabolic panel   Hemoglobin A1c     Endocrine   Diabetic glomerulopathy [facility])    An individual care plan for diabetes was established and reinforced today.  The patient's status was assessed using clinical findings  on exam, labs and diagnostic testing. Patient success at meeting goals based on disease specific evidence-based guidelines and found to be good controlled. Due to diarrhea, we will cut metformin down to 536m bid Medications were assessed and patient's understanding of the medical issues , including barriers were assessed. Recommend adherence to a diabetic diet, a graduated exercise program, HgbA1c level is checked quarterly, and urine microalbumin performed yearly .  Annual mono-filament sensation testing performed. Lower blood pressure and control hyperlipidemia is important. Get annual eye exams and annual flu shots and smoking cessation discussed.  Self management goals were discussed.      Relevant Orders   Comprehensive metabolic panel   Hemoglobin A1c     Other   Generalized anxiety disorder    Her generalized anxiety is improving.      Relevant Orders   Comprehensive metabolic panel   Hemoglobin A1c      No orders of the defined types were placed in this encounter.   Follow-up: Return in about 2 months (around 11/03/2019) for fasting.    LReinaldo Meeker MD

## 2019-09-03 NOTE — Assessment & Plan Note (Signed)
Her generalized anxiety is improving.

## 2019-09-03 NOTE — Assessment & Plan Note (Signed)
An individual care plan for diabetes was established and reinforced today.  The patient's status was assessed using clinical findings on exam, labs and diagnostic testing. Patient success at meeting goals based on disease specific evidence-based guidelines and found to be good controlled. Due to diarrhea, we will cut metformin down to 500mg  bid Medications were assessed and patient's understanding of the medical issues , including barriers were assessed. Recommend adherence to a diabetic diet, a graduated exercise program, HgbA1c level is checked quarterly, and urine microalbumin performed yearly .  Annual mono-filament sensation testing performed. Lower blood pressure and control hyperlipidemia is important. Get annual eye exams and annual flu shots and smoking cessation discussed.  Self management goals were discussed.

## 2019-09-03 NOTE — Assessment & Plan Note (Signed)

## 2019-09-04 LAB — COMPREHENSIVE METABOLIC PANEL
ALT: 23 IU/L (ref 0–32)
AST: 13 IU/L (ref 0–40)
Albumin/Globulin Ratio: 1.9 (ref 1.2–2.2)
Albumin: 4.4 g/dL (ref 3.8–4.8)
Alkaline Phosphatase: 57 IU/L (ref 48–121)
BUN/Creatinine Ratio: 13 (ref 12–28)
BUN: 26 mg/dL (ref 8–27)
Bilirubin Total: 0.2 mg/dL (ref 0.0–1.2)
CO2: 20 mmol/L (ref 20–29)
Calcium: 9.9 mg/dL (ref 8.7–10.3)
Chloride: 106 mmol/L (ref 96–106)
Creatinine, Ser: 1.98 mg/dL — ABNORMAL HIGH (ref 0.57–1.00)
GFR calc Af Amer: 30 mL/min/{1.73_m2} — ABNORMAL LOW (ref >59)
GFR calc non Af Amer: 26 mL/min/{1.73_m2} — ABNORMAL LOW (ref >59)
Globulin, Total: 2.3 g/dL (ref 1.5–4.5)
Glucose: 211 mg/dL — ABNORMAL HIGH (ref 65–99)
Potassium: 4.6 mmol/L (ref 3.5–5.2)
Sodium: 142 mmol/L (ref 134–144)
Total Protein: 6.7 g/dL (ref 6.0–8.5)

## 2019-09-04 LAB — HEMOGLOBIN A1C
Est. average glucose Bld gHb Est-mCnc: 154 mg/dL
Hgb A1c MFr Bld: 7 % — ABNORMAL HIGH (ref 4.8–5.6)

## 2019-09-04 NOTE — Progress Notes (Signed)
Glucose 211. Kidney tests improved, liver tests normal, A1c 7.0 very good

## 2019-09-14 ENCOUNTER — Other Ambulatory Visit: Payer: Self-pay | Admitting: Legal Medicine

## 2019-09-14 DIAGNOSIS — E1142 Type 2 diabetes mellitus with diabetic polyneuropathy: Secondary | ICD-10-CM

## 2019-09-26 ENCOUNTER — Other Ambulatory Visit: Payer: Self-pay | Admitting: Legal Medicine

## 2019-09-30 ENCOUNTER — Other Ambulatory Visit: Payer: Self-pay | Admitting: Legal Medicine

## 2019-10-01 ENCOUNTER — Other Ambulatory Visit: Payer: Self-pay | Admitting: Legal Medicine

## 2019-10-01 ENCOUNTER — Other Ambulatory Visit: Payer: Self-pay

## 2019-10-01 DIAGNOSIS — E1142 Type 2 diabetes mellitus with diabetic polyneuropathy: Secondary | ICD-10-CM

## 2019-10-01 MED ORDER — FREESTYLE LITE TEST VI STRP: 1.0000 | ORAL_STRIP | Freq: Every day | 2 refills | Status: DC

## 2019-10-02 DIAGNOSIS — K219 Gastro-esophageal reflux disease without esophagitis: Secondary | ICD-10-CM | POA: Diagnosis not present

## 2019-10-02 DIAGNOSIS — E1122 Type 2 diabetes mellitus with diabetic chronic kidney disease: Secondary | ICD-10-CM | POA: Diagnosis not present

## 2019-10-02 DIAGNOSIS — Z794 Long term (current) use of insulin: Secondary | ICD-10-CM | POA: Diagnosis not present

## 2019-10-02 DIAGNOSIS — I129 Hypertensive chronic kidney disease with stage 1 through stage 4 chronic kidney disease, or unspecified chronic kidney disease: Secondary | ICD-10-CM | POA: Diagnosis not present

## 2019-10-02 DIAGNOSIS — N179 Acute kidney failure, unspecified: Secondary | ICD-10-CM | POA: Diagnosis not present

## 2019-10-02 DIAGNOSIS — E1165 Type 2 diabetes mellitus with hyperglycemia: Secondary | ICD-10-CM | POA: Diagnosis not present

## 2019-10-02 DIAGNOSIS — R809 Proteinuria, unspecified: Secondary | ICD-10-CM | POA: Diagnosis not present

## 2019-10-05 ENCOUNTER — Encounter: Payer: Self-pay | Admitting: Legal Medicine

## 2019-10-06 ENCOUNTER — Other Ambulatory Visit: Payer: Self-pay | Admitting: Nephrology

## 2019-10-06 DIAGNOSIS — N179 Acute kidney failure, unspecified: Secondary | ICD-10-CM

## 2019-10-16 ENCOUNTER — Ambulatory Visit
Admission: RE | Admit: 2019-10-16 | Discharge: 2019-10-16 | Disposition: A | Discharge status: Home / Self Care | Payer: TRICARE For Life (TFL) | Source: Ambulatory Visit | Attending: Nephrology | Admitting: Nephrology

## 2019-10-16 DIAGNOSIS — N179 Acute kidney failure, unspecified: Secondary | ICD-10-CM | POA: Diagnosis not present

## 2019-10-16 DIAGNOSIS — Q600 Renal agenesis, unilateral: Secondary | ICD-10-CM | POA: Diagnosis not present

## 2019-10-21 ENCOUNTER — Other Ambulatory Visit: Payer: Self-pay | Admitting: Legal Medicine

## 2019-11-05 ENCOUNTER — Ambulatory Visit: Payer: TRICARE For Life (TFL) | Admitting: Legal Medicine

## 2019-11-14 ENCOUNTER — Other Ambulatory Visit: Payer: Self-pay | Admitting: Legal Medicine

## 2019-11-14 ENCOUNTER — Other Ambulatory Visit: Payer: Self-pay | Admitting: Physician Assistant

## 2019-11-14 MED ORDER — OMEPRAZOLE 40 MG PO CPDR: 40.0000 mg | DELAYED_RELEASE_CAPSULE | Freq: Every day | ORAL | 0 refills | Status: DC

## 2020-01-31 ENCOUNTER — Other Ambulatory Visit: Payer: Self-pay | Admitting: Physician Assistant

## 2020-02-03 ENCOUNTER — Other Ambulatory Visit: Payer: Self-pay

## 2020-02-03 DIAGNOSIS — F320 Major depressive disorder, single episode, mild: Secondary | ICD-10-CM

## 2020-02-03 MED ORDER — CITALOPRAM HYDROBROMIDE 20 MG PO TABS: 20.0000 mg | ORAL_TABLET | Freq: Every day | ORAL | 2 refills | Status: DC

## 2020-02-03 MED ORDER — LISINOPRIL 2.5 MG PO TABS: 2.5000 mg | ORAL_TABLET | Freq: Every day | ORAL | 2 refills | Status: DC

## 2020-04-08 ENCOUNTER — Other Ambulatory Visit: Payer: Self-pay | Admitting: Legal Medicine

## 2020-04-27 ENCOUNTER — Other Ambulatory Visit: Payer: Self-pay | Admitting: Family Medicine

## 2020-04-27 NOTE — Telephone Encounter (Signed)
Your pt

## 2020-04-29 ENCOUNTER — Other Ambulatory Visit: Payer: Self-pay | Admitting: Legal Medicine

## 2020-06-18 DIAGNOSIS — S0990XA Unspecified injury of head, initial encounter: Secondary | ICD-10-CM | POA: Diagnosis not present

## 2020-06-18 DIAGNOSIS — I129 Hypertensive chronic kidney disease with stage 1 through stage 4 chronic kidney disease, or unspecified chronic kidney disease: Secondary | ICD-10-CM | POA: Diagnosis not present

## 2020-06-18 DIAGNOSIS — G9341 Metabolic encephalopathy: Secondary | ICD-10-CM | POA: Diagnosis not present

## 2020-06-18 DIAGNOSIS — Y929 Unspecified place or not applicable: Secondary | ICD-10-CM | POA: Diagnosis not present

## 2020-06-18 DIAGNOSIS — Y33XXXA Other specified events, undetermined intent, initial encounter: Secondary | ICD-10-CM | POA: Diagnosis not present

## 2020-06-18 DIAGNOSIS — E1165 Type 2 diabetes mellitus with hyperglycemia: Secondary | ICD-10-CM | POA: Diagnosis not present

## 2020-06-18 DIAGNOSIS — N3000 Acute cystitis without hematuria: Secondary | ICD-10-CM | POA: Diagnosis not present

## 2020-06-18 DIAGNOSIS — D649 Anemia, unspecified: Secondary | ICD-10-CM | POA: Diagnosis not present

## 2020-06-18 DIAGNOSIS — E871 Hypo-osmolality and hyponatremia: Secondary | ICD-10-CM | POA: Diagnosis not present

## 2020-06-18 DIAGNOSIS — I10 Essential (primary) hypertension: Secondary | ICD-10-CM | POA: Diagnosis not present

## 2020-06-18 DIAGNOSIS — W2209XA Striking against other stationary object, initial encounter: Secondary | ICD-10-CM | POA: Diagnosis not present

## 2020-06-18 DIAGNOSIS — N189 Chronic kidney disease, unspecified: Secondary | ICD-10-CM | POA: Diagnosis not present

## 2020-06-18 DIAGNOSIS — R4182 Altered mental status, unspecified: Secondary | ICD-10-CM | POA: Diagnosis not present

## 2020-06-18 DIAGNOSIS — E1122 Type 2 diabetes mellitus with diabetic chronic kidney disease: Secondary | ICD-10-CM | POA: Diagnosis not present

## 2020-06-19 DIAGNOSIS — N3000 Acute cystitis without hematuria: Secondary | ICD-10-CM | POA: Diagnosis not present

## 2020-06-19 DIAGNOSIS — R4182 Altered mental status, unspecified: Secondary | ICD-10-CM | POA: Insufficient documentation

## 2020-06-19 DIAGNOSIS — I10 Essential (primary) hypertension: Secondary | ICD-10-CM | POA: Diagnosis not present

## 2020-06-19 DIAGNOSIS — G9341 Metabolic encephalopathy: Secondary | ICD-10-CM | POA: Diagnosis not present

## 2020-06-20 DIAGNOSIS — R4182 Altered mental status, unspecified: Secondary | ICD-10-CM | POA: Diagnosis not present

## 2020-06-21 DIAGNOSIS — E118 Type 2 diabetes mellitus with unspecified complications: Secondary | ICD-10-CM | POA: Diagnosis not present

## 2020-06-21 DIAGNOSIS — E782 Mixed hyperlipidemia: Secondary | ICD-10-CM | POA: Diagnosis not present

## 2020-06-21 DIAGNOSIS — E1142 Type 2 diabetes mellitus with diabetic polyneuropathy: Secondary | ICD-10-CM | POA: Diagnosis not present

## 2020-06-21 DIAGNOSIS — I10 Essential (primary) hypertension: Secondary | ICD-10-CM | POA: Diagnosis not present

## 2020-06-21 DIAGNOSIS — K219 Gastro-esophageal reflux disease without esophagitis: Secondary | ICD-10-CM | POA: Diagnosis not present

## 2020-06-21 DIAGNOSIS — F411 Generalized anxiety disorder: Secondary | ICD-10-CM | POA: Diagnosis not present

## 2020-06-21 DIAGNOSIS — R4182 Altered mental status, unspecified: Secondary | ICD-10-CM | POA: Diagnosis not present

## 2020-06-22 ENCOUNTER — Other Ambulatory Visit: Payer: Self-pay | Admitting: Legal Medicine

## 2020-06-22 ENCOUNTER — Telehealth: Payer: Self-pay

## 2020-06-22 NOTE — Telephone Encounter (Signed)
  Transition Care Management Follow-up Telephone Call    Donna Moran [birth year]  Admit Date: 06/19/20 Discharge Date: 06/21/20 Discharged from where: [facility]  Diagnoses: Metabolic Encephalopathy (G93.41), Altered mental status (R41.82)  2 day post discharge: 06/23/20 7 day post discharge: 06/28/20 14 day post discharge: 07/05/20  Donna Moran was discharged from [facility] on 06/21/20 with the diagnoses listed above.  She was contacted today via telephone in regards to transition of care.  I attempted to reach patient three times unsuccessfully.  A letter was mailed to her on 06/24/20 to contact our office to make an appointment.  Patient was evaluated and released from the [facility] ED on 3/5 after a fall striking her head on a cabinet.  Her CT scan of the head was unremarkable.  She had a glucose of 316 therefore was given 1L of IV fluids.  Her UA was consistent with a UTI and given 1g of Rocephin and PO Keflex.    The following day, 3/6, Donna Moran was brought back to the ED by her daughter with complaints of intermittent confusion, unsteadiness on her feet, and high blood sugars.  -MRI of brain was negative for any acute process  -Patient received 3 does of IV Ceftriaxone for UTI, urinary symptoms improved by discharge time  -Patient's A1C noted to be 11 with multiple elevated glucose readings. ((Last A1C at our office was 7.0 on 09/03/19 - patient is on Metformin & 15 units of Lantus daily))  -Altered mental status was at baseline when patient was discharged  -Home Health PT and OT was ordered at discharge for frequent falls  -SNF was recommended however family declined   Discharge Instructions: Follow-up with PCP for Diabetes Management -Discharged with Keflex 500 mg BID #20  Items Reviewed:  Did the pt receive and understand the discharge instructions provided?  Medications obtained and verified? Yes   Other? No   Any new allergies since your  discharge? No   Dietary orders reviewed? Yes  Do you have support at home?  Home Care and Equipment/Supplies: Were home health services ordered? yes If so, what is the name of the agency?   Has the agency set up a time to come to the patient's home?  Were any new equipment or medical supplies ordered?  No  Functional Questionnaire: (I = Independent and D = Dependent) ADLs:   Bathing/Dressing-   Meal Prep-   Eating-   Maintaining continence-   Transferring/Ambulation-   Managing Meds-    Any patient concerns?   Follow up appointments reviewed:  PCP Hospital f/u appt confirmed? Letter sent to patient  [facility] f/u appt confirmed?   Are transportation arrangements needed?   If their condition worsens, is the pt aware to call PCP or go to the Emergency Dept.?   Was the patient provided with contact information for the PCP's office/after hours number?   Was to pt encouraged to call back with questions or concerns?     Donna Corn, LPN 95/28/41 32:44 PM

## 2020-06-29 ENCOUNTER — Telehealth: Payer: Self-pay

## 2020-06-29 NOTE — Telephone Encounter (Signed)
Reached out to patient to schedule hospital follow-up appointment, she was not available.  Mr Reinking said he would have her call back.

## 2020-06-29 NOTE — Telephone Encounter (Signed)
      Berneta Sages, Donna Moran's husband, returned my call.  He states that [name] has a moderate amount of hip pain.  He could not confirm if it is from the fall on 3/5 or if she has fallen again.  He states that she is in another room and has been staying in the bed.  I recommended an urgent appointment with Dr Henrene Pastor and he stated that he will have to get their daughter, Patrici Ranks, to call me back because she will have to provide transportation as neither of them are able to walk.  Shelle Iron, LPN [phone] PM

## 2020-07-04 ENCOUNTER — Other Ambulatory Visit: Payer: Self-pay | Admitting: Legal Medicine

## 2020-08-28 DIAGNOSIS — R410 Disorientation, unspecified: Secondary | ICD-10-CM | POA: Diagnosis not present

## 2020-08-28 DIAGNOSIS — R531 Weakness: Secondary | ICD-10-CM | POA: Diagnosis not present

## 2020-08-28 DIAGNOSIS — Z681 Body mass index (BMI) 19 or less, adult: Secondary | ICD-10-CM | POA: Diagnosis not present

## 2020-08-28 DIAGNOSIS — N189 Chronic kidney disease, unspecified: Secondary | ICD-10-CM | POA: Diagnosis not present

## 2020-08-28 DIAGNOSIS — E871 Hypo-osmolality and hyponatremia: Secondary | ICD-10-CM | POA: Diagnosis not present

## 2020-08-28 DIAGNOSIS — E1165 Type 2 diabetes mellitus with hyperglycemia: Secondary | ICD-10-CM | POA: Diagnosis not present

## 2020-08-28 DIAGNOSIS — G319 Degenerative disease of nervous system, unspecified: Secondary | ICD-10-CM | POA: Diagnosis not present

## 2020-08-28 DIAGNOSIS — I129 Hypertensive chronic kidney disease with stage 1 through stage 4 chronic kidney disease, or unspecified chronic kidney disease: Secondary | ICD-10-CM | POA: Diagnosis not present

## 2020-08-28 DIAGNOSIS — R739 Hyperglycemia, unspecified: Secondary | ICD-10-CM | POA: Diagnosis not present

## 2020-08-28 DIAGNOSIS — N170 Acute kidney failure with tubular necrosis: Secondary | ICD-10-CM | POA: Diagnosis not present

## 2020-08-28 DIAGNOSIS — I6782 Cerebral ischemia: Secondary | ICD-10-CM | POA: Diagnosis not present

## 2020-08-28 DIAGNOSIS — E440 Moderate protein-calorie malnutrition: Secondary | ICD-10-CM | POA: Diagnosis not present

## 2020-08-28 DIAGNOSIS — I6529 Occlusion and stenosis of unspecified carotid artery: Secondary | ICD-10-CM | POA: Diagnosis not present

## 2020-08-28 DIAGNOSIS — N390 Urinary tract infection, site not specified: Secondary | ICD-10-CM | POA: Diagnosis not present

## 2020-08-28 DIAGNOSIS — G9341 Metabolic encephalopathy: Secondary | ICD-10-CM | POA: Diagnosis not present

## 2020-09-05 ENCOUNTER — Telehealth: Payer: Self-pay

## 2020-09-05 NOTE — Telephone Encounter (Signed)
  Transition Care Management Follow-up Telephone Call    Donna Moran May 19, 1955  Admit Date: 08/28/20 Discharge Date: 09/04/20 Discharged from where: [facility]  Diagnoses: UTI, Acute metabolic encephalopathy, Hyperglycemia due to DM, Gen Weakness (+ more)  2 day post discharge: 09/06/20 7 day post discharge: 09/11/20 14 day post discharge: 09/18/20  Retal Medlen was discharged from [facility] on 09/04/20 with the diagnoses listed above.  She was contacted today via telephone in regards to transition of care.  Mrs Ghanem did not answer so I contacted her daughter, Patrici Ranks.  She states that she is with her mother and she is doing much better than before.  She is not as confused and is taking her medication as prescribed.  Patient was hospitalized earlier this year and has not responded to calls and letter for follow-up with PCP.  It has been over one year since she has been seen in our office.  Patrici Ranks is helping out at her parents home because neither of them can care for themselves at this time.  She presented to the ED with confusion and weakness.  Her blood sugar was >600 which decreased to 272 after IV normal saline and IV insulin in the ED.  Family reported that patient has not been taking medication as prescribed.  A1C 10.8  -Brain MRI showed no acute intracranial abnormality. - UA cultured Pseudomonas - treated with IV Cefepime -Discharged with Levofloxacin 500 mg PO QD x 7 days -Home Health, PT/OT ordered at discharge  Medications at discharge: Levaquin 500 mg PO QD #7 Flora Q one tab PO BIDLS Omeprazole 40 mg PO QD Metformin HCL 1000 mg PO BID Lantus 15 units SQ QAM Lisinopril 2.5 mg PO QD Citalopram HBr 20 mg PO QD   Discharge Instructions: Complete ABT as ordered, follow-up with PCP for medication management, Home Health/PT/OT  Items Reviewed:  Did the pt receive and understand the discharge instructions provided? Yes   Medications obtained and  verified? Yes   Any new allergies since your discharge? No   Dietary orders reviewed? Yes  Do you have support at home? Yes   Home Care and Equipment/Supplies: Were home health services ordered? yes If so, what is the name of the agency?   Has the agency set up a time to come to the patient's home? No, Patrici Ranks will call me back if she has not heard anything from them by noon tomorrow. Were any new equipment or medical supplies ordered?  Yes: Gilford Rile Were you able to get the supplies/equipment? yes Do you have any questions related to the use of the equipment or supplies? No  Functional Questionnaire: (I = Independent and D = Dependent) ADLs: Needs assistance  Bathing/Dressing- I  Meal Prep- Needs assistance  Eating- I  Maintaining continence- I  Transferring/Ambulation- Needs assistance  Managing Meds- Needs assistance   Any patient concerns? no  Follow up appointments reviewed:  PCP Hospital f/u appt confirmed? No  Daughter will call back to schedule appointment with Dr Henrene Pastor for this week  Are transportation arrangements needed? Yes  Dependent on daughter to drive her  If their condition worsens, is the pt aware to call PCP or go to the Emergency Dept.? Yes  Was the patient provided with contact information for the PCP's office/after hours number? Yes  Was to pt encouraged to call back with questions or concerns? Yes    Shelle Iron, LPN [id number] [phone] PM

## 2020-09-06 ENCOUNTER — Telehealth: Payer: Self-pay

## 2020-09-06 DIAGNOSIS — E860 Dehydration: Secondary | ICD-10-CM | POA: Diagnosis not present

## 2020-09-06 DIAGNOSIS — I129 Hypertensive chronic kidney disease with stage 1 through stage 4 chronic kidney disease, or unspecified chronic kidney disease: Secondary | ICD-10-CM | POA: Diagnosis not present

## 2020-09-06 DIAGNOSIS — D631 Anemia in chronic kidney disease: Secondary | ICD-10-CM | POA: Diagnosis not present

## 2020-09-06 DIAGNOSIS — N170 Acute kidney failure with tubular necrosis: Secondary | ICD-10-CM | POA: Diagnosis not present

## 2020-09-06 DIAGNOSIS — N1832 Chronic kidney disease, stage 3b: Secondary | ICD-10-CM | POA: Diagnosis not present

## 2020-09-06 DIAGNOSIS — R262 Difficulty in walking, not elsewhere classified: Secondary | ICD-10-CM | POA: Diagnosis not present

## 2020-09-06 DIAGNOSIS — G9341 Metabolic encephalopathy: Secondary | ICD-10-CM | POA: Diagnosis not present

## 2020-09-06 DIAGNOSIS — B965 Pseudomonas (aeruginosa) (mallei) (pseudomallei) as the cause of diseases classified elsewhere: Secondary | ICD-10-CM | POA: Diagnosis not present

## 2020-09-06 DIAGNOSIS — N390 Urinary tract infection, site not specified: Secondary | ICD-10-CM | POA: Diagnosis not present

## 2020-09-06 DIAGNOSIS — E1122 Type 2 diabetes mellitus with diabetic chronic kidney disease: Secondary | ICD-10-CM | POA: Diagnosis not present

## 2020-09-06 NOTE — Telephone Encounter (Signed)
Donna Moran, nurse with [facility] home health, evaluated pt today. Requesting verbal orders for skilled nursing with schedule as below and PT, OT, social work eval. Gave verbal for these orders. Nursing schedule twice a week for four weeks then once a week for four weeks to help with medications and compliance.   [name] 09/06/20 4:48 PM

## 2020-09-08 DIAGNOSIS — N390 Urinary tract infection, site not specified: Secondary | ICD-10-CM | POA: Diagnosis not present

## 2020-09-08 DIAGNOSIS — I129 Hypertensive chronic kidney disease with stage 1 through stage 4 chronic kidney disease, or unspecified chronic kidney disease: Secondary | ICD-10-CM | POA: Diagnosis not present

## 2020-09-08 DIAGNOSIS — N1832 Chronic kidney disease, stage 3b: Secondary | ICD-10-CM | POA: Diagnosis not present

## 2020-09-08 DIAGNOSIS — E1122 Type 2 diabetes mellitus with diabetic chronic kidney disease: Secondary | ICD-10-CM | POA: Diagnosis not present

## 2020-09-08 DIAGNOSIS — B965 Pseudomonas (aeruginosa) (mallei) (pseudomallei) as the cause of diseases classified elsewhere: Secondary | ICD-10-CM | POA: Diagnosis not present

## 2020-09-08 DIAGNOSIS — G9341 Metabolic encephalopathy: Secondary | ICD-10-CM | POA: Diagnosis not present

## 2020-09-08 DIAGNOSIS — D631 Anemia in chronic kidney disease: Secondary | ICD-10-CM | POA: Diagnosis not present

## 2020-09-08 DIAGNOSIS — N170 Acute kidney failure with tubular necrosis: Secondary | ICD-10-CM | POA: Diagnosis not present

## 2020-09-08 DIAGNOSIS — E860 Dehydration: Secondary | ICD-10-CM | POA: Diagnosis not present

## 2020-09-13 DIAGNOSIS — I129 Hypertensive chronic kidney disease with stage 1 through stage 4 chronic kidney disease, or unspecified chronic kidney disease: Secondary | ICD-10-CM | POA: Diagnosis not present

## 2020-09-13 DIAGNOSIS — G9341 Metabolic encephalopathy: Secondary | ICD-10-CM | POA: Diagnosis not present

## 2020-09-13 DIAGNOSIS — E860 Dehydration: Secondary | ICD-10-CM | POA: Diagnosis not present

## 2020-09-13 DIAGNOSIS — N1832 Chronic kidney disease, stage 3b: Secondary | ICD-10-CM | POA: Diagnosis not present

## 2020-09-13 DIAGNOSIS — N170 Acute kidney failure with tubular necrosis: Secondary | ICD-10-CM | POA: Diagnosis not present

## 2020-09-13 DIAGNOSIS — B965 Pseudomonas (aeruginosa) (mallei) (pseudomallei) as the cause of diseases classified elsewhere: Secondary | ICD-10-CM | POA: Diagnosis not present

## 2020-09-13 DIAGNOSIS — N390 Urinary tract infection, site not specified: Secondary | ICD-10-CM | POA: Diagnosis not present

## 2020-09-13 DIAGNOSIS — D631 Anemia in chronic kidney disease: Secondary | ICD-10-CM | POA: Diagnosis not present

## 2020-09-13 DIAGNOSIS — E1122 Type 2 diabetes mellitus with diabetic chronic kidney disease: Secondary | ICD-10-CM | POA: Diagnosis not present

## 2020-09-14 ENCOUNTER — Other Ambulatory Visit: Payer: Self-pay

## 2020-09-14 ENCOUNTER — Ambulatory Visit (INDEPENDENT_AMBULATORY_CARE_PROVIDER_SITE_OTHER): Payer: Medicare PPO | Admitting: Legal Medicine

## 2020-09-14 ENCOUNTER — Encounter: Payer: Self-pay | Admitting: Legal Medicine

## 2020-09-14 VITALS — BP 104/60 | HR 112 | Temp 98.1°F | Resp 16 | Ht 64.0 in | Wt 115.0 lb

## 2020-09-14 DIAGNOSIS — F0280 Dementia in other diseases classified elsewhere without behavioral disturbance: Secondary | ICD-10-CM | POA: Insufficient documentation

## 2020-09-14 DIAGNOSIS — E782 Mixed hyperlipidemia: Secondary | ICD-10-CM | POA: Diagnosis not present

## 2020-09-14 DIAGNOSIS — A4152 Sepsis due to Pseudomonas: Secondary | ICD-10-CM

## 2020-09-14 DIAGNOSIS — I10 Essential (primary) hypertension: Secondary | ICD-10-CM | POA: Diagnosis not present

## 2020-09-14 DIAGNOSIS — E1121 Type 2 diabetes mellitus with diabetic nephropathy: Secondary | ICD-10-CM

## 2020-09-14 DIAGNOSIS — M05721 Rheumatoid arthritis with rheumatoid factor of right elbow without organ or systems involvement: Secondary | ICD-10-CM | POA: Diagnosis not present

## 2020-09-14 DIAGNOSIS — G301 Alzheimer's disease with late onset: Secondary | ICD-10-CM | POA: Insufficient documentation

## 2020-09-14 DIAGNOSIS — M05722 Rheumatoid arthritis with rheumatoid factor of left elbow without organ or systems involvement: Secondary | ICD-10-CM | POA: Diagnosis not present

## 2020-09-14 DIAGNOSIS — F320 Major depressive disorder, single episode, mild: Secondary | ICD-10-CM

## 2020-09-14 DIAGNOSIS — A419 Sepsis, unspecified organism: Secondary | ICD-10-CM | POA: Insufficient documentation

## 2020-09-14 DIAGNOSIS — D6489 Other specified anemias: Secondary | ICD-10-CM | POA: Diagnosis not present

## 2020-09-14 DIAGNOSIS — F0390 Unspecified dementia without behavioral disturbance: Secondary | ICD-10-CM | POA: Insufficient documentation

## 2020-09-14 MED ORDER — FREESTYLE LIBRE 14 DAY SENSOR MISC: 1.0000 | Freq: Three times a day (TID) | 3 refills | Status: DC

## 2020-09-14 MED ORDER — FREESTYLE LIBRE 14 DAY READER DEVI: 1.0000 | Freq: Three times a day (TID) | 0 refills | Status: AC

## 2020-09-14 MED ORDER — DONEPEZIL HCL 10 MG PO TABS: 10.0000 mg | ORAL_TABLET | Freq: Every day | ORAL | 2 refills | Status: DC

## 2020-09-14 MED ORDER — TRULICITY 0.75 MG/0.5ML ~~LOC~~ SOAJ: 0.7500 mg | SUBCUTANEOUS | 2 refills | Status: DC

## 2020-09-14 NOTE — Progress Notes (Signed)
Subjective:  Patient ID: Donna Moran, female    DOB: [birth year]  Age: 65 y.o. MRN: [id number]  Chief Complaint  Patient presents with  . Transitions Of Care    Patient was admitted on 08/28/2020 to 09/04/2020 at [facility].  . Urinary Tract Infection    HPI: patient presents with transition of care and reconciliation of medicines.  Patient was admitted on 08/18/2020 with confusion, uncontrolled glucose and grew out pseudomonas treated with IV fluids and treated with antibiotics. She was discharged on the 09/04/2020.  Patient present with type 2 diabetes.  Specifically, this is type 2, insulin requiring diabetes, complicated by polyneuropathy and renal disease..  Compliance with treatment has been good; patient take medicines as directed, maintains diet and exercise regimen, follows up as directed, and is keeping glucose diary.  Date of  diagnosis 2020, .  Depression screen has been performed.Tobacco screen nonsmoker. Current medicines for diabetes lantus 15 units and metformin.  Patient is on lisinopril for renal protection and atorvastatin for cholesterol control.  Patient performs foot exams daily and last ophthalmologic exam was no  She is showing memory deficits.  She is not eating well.  .   Current Outpatient Medications on File Prior to Visit  Medication Sig Dispense Refill  . atorvastatin (LIPITOR) 40 MG tablet Take 40 mg by mouth at bedtime.    . B-D UF III MINI PEN NEEDLES 31G X 5 MM MISC Inject 1 each into the skin 3 (three) times daily. 30 each 2  . Blood Glucose Monitoring Suppl (ACCU-CHEK GUIDE) w/Device KIT USE TO CHECK GLUCOSE IN THE MORNING AND WITH MEALS    . citalopram (CELEXA) 20 MG tablet Take 1 tablet (20 mg total) by mouth daily. 90 tablet 2  . glucose blood (FREESTYLE LITE) test strip 1 each by Other route daily before breakfast. Use as instructed 300 strip 2  . lisinopril (ZESTRIL) 2.5 MG tablet Take 1 tablet (2.5 mg total) by mouth daily. 90 tablet 2  .  metFORMIN (GLUCOPHAGE) 1000 MG tablet TAKE 1 TABLET BY MOUTH TWICE DAILY 180 tablet 2  . omeprazole (PRILOSEC) 40 MG capsule TAKE 1 CAPSULE BY MOUTH DAILY 90 capsule 2   No current facility-administered medications on file prior to visit.   Past Medical History:  Diagnosis Date  . Diabetes mellitus without complication ([place])   . History of blood transfusion   . Hyperlipidemia    Past Surgical History:  Procedure Laterality Date  . CATARACT EXTRACTION Bilateral 04/2013-05/2013    Family History  Problem Relation Age of Onset  . Cancer Neg Hx   . Diabetes Neg Hx   . Stroke Neg Hx    Social History   Socioeconomic History  . Marital status: Married    Spouse name: Not on file  . Number of children: 2  . Years of education: Not on file  . Highest education level: Not on file  Occupational History  . 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 Smoking status: Former Research scientist (life sciences)  . Smokeless tobacco: Never Used  Substance and Sexual Activity  . Alcohol use: No  . Drug use: No  . Sexual activity: Yes  Other Topics Concern  . Not on file  Social History Narrative   Regular exercise-no   Caffeine Use-yes   Social Determinants of Health   Financial Resource Strain: Not on file  Food Insecurity: Not on file  Transportation Needs: Not on file  Physical Activity: Not on  file  Stress: Not on file  Social Connections: Not on file    Review of Systems  Constitutional: Negative for activity change and appetite change.  HENT: Negative for congestion and rhinorrhea.   Respiratory: Negative for chest tightness and shortness of breath.   Cardiovascular: Negative for chest pain, palpitations and leg swelling.  Gastrointestinal: Negative for abdominal distention and abdominal pain.  Genitourinary: Negative for difficulty urinating and dysuria.  Musculoskeletal: Negative for arthralgias and back pain.  Skin: Negative.   Neurological: Positive for weakness.   Psychiatric/Behavioral: Positive for confusion and dysphoric mood.     Objective:  BP 104/60   Pulse (!) 112   Temp 98.1 F (36.7 C)   Resp 16   Ht 5' 4" (1.626 m)   Wt 115 lb (52.2 kg)   SpO2 95%   BMI 19.74 kg/m   BP/Weight 09/14/2020 09/03/2019 8/93/8101  Systolic BP 751 025 852  Diastolic BP 60 60 70  Wt. (Lbs) 115 130.4 140  BMI 19.74 22.38 24.03    Physical Exam Vitals reviewed.  Constitutional:      Appearance: Normal appearance. She is normal weight.  HENT:     Head: Normocephalic.     Right Ear: Tympanic membrane, ear canal and external ear normal.     Left Ear: Tympanic membrane, ear canal and external ear normal.     Nose: Nose normal.     Mouth/Throat:     Mouth: Mucous membranes are moist.     Pharynx: Oropharynx is clear.  Eyes:     Extraocular Movements: Extraocular movements intact.     Conjunctiva/sclera: Conjunctivae normal.     Pupils: Pupils are equal, round, and reactive to light.  Cardiovascular:     Rate and Rhythm: Normal rate and regular rhythm.     Pulses: Normal pulses.     Heart sounds: Normal heart sounds. No murmur heard. No gallop.   Pulmonary:     Effort: Pulmonary effort is normal. No respiratory distress.     Breath sounds: Normal breath sounds. No rales.  Abdominal:     General: Abdomen is flat. Bowel sounds are normal. There is no distension.     Palpations: Abdomen is soft.     Tenderness: There is no abdominal tenderness.  Musculoskeletal:        General: Normal range of motion.     Cervical back: Normal range of motion.  Skin:    General: Skin is warm.     Capillary Refill: Capillary refill takes less than 2 seconds.  Neurological:     General: No focal deficit present.     Mental Status: She is alert and oriented to person, place, and time. Mental status is at baseline.     Diabetic Foot Exam - Simple   Simple Foot Form Diabetic Foot exam was performed with the following findings: Yes 09/14/2020  3:08 PM  Visual  Inspection No deformities, no ulcerations, no other skin breakdown bilaterally: Yes Sensation Testing See comments: Yes Pulse Check Posterior Tibialis and Dorsalis pulse intact bilaterally: Yes Comments Decreased sensation feet    MMSE 17/30  Lab Results  Component Value Date   WBC 7.0 08/05/2019   HGB 11.2 08/05/2019   HCT 34.2 08/05/2019   PLT 278 08/05/2019   GLUCOSE 211 (H) 09/03/2019   CHOL 276 (H) 08/05/2019   TRIG 123 08/05/2019   HDL 87 08/05/2019   LDLCALC 168 (H) 08/05/2019   ALT 23 09/03/2019   AST 13 09/03/2019  NA 142 09/03/2019   K 4.6 09/03/2019   CL 106 09/03/2019   CREATININE 1.98 (H) 09/03/2019   BUN 26 09/03/2019   CO2 20 09/03/2019   TSH 0.67 10/07/2012   HGBA1C 7.0 (H) 09/03/2019   MICROALBUR 14.5 (H) 01/26/2015   Depression screen PHQ 2/9 09/14/2020 08/05/2019 01/26/2015 07/01/2013 10/07/2012  Decreased Interest 3 2 0 0 0  Down, Depressed, Hopeless 1 2 0 0 0  PHQ - 2 Score 4 4 0 0 0  Altered sleeping 3 0 - - -  Tired, decreased energy 3 2 - - -  Change in appetite 3 2 - - -  Feeling bad or failure about yourself  0 2 - - -  Trouble concentrating 3 3 - - -  Moving slowly or fidgety/restless 2 1 - - -  Suicidal thoughts 0 0 - - -  PHQ-9 Score 18 14 - - -  Difficult doing work/chores Somewhat difficult - - - -      Assessment & Plan:  Diagnoses and all orders for this visit: Sepsis due to Pseudomonas species without acute organ dysfunction ([place]) Patient was treated for pseudomonas UTI with sepsis.  She did well and living with daughter  Mixed hyperlipidemia -     Hemoglobin A1c AN INDIVIDUAL CARE PLAN for hyperlipidemia/ cholesterol was established and reinforced today.  The patient's status was assessed using clinical findings on exam, lab and other diagnostic tests. The patient's disease status was assessed based on evidence-based guidelines and found to be fair controlled. MEDICATIONS were review. SELF MANAGEMENT GOALS have been discussed  and patient's success at attaining the goal of low cholesterol was assessed. RECOMMENDATION given include regular exercise 3 days a week and low cholesterol/low fat diet. CLINICAL SUMMARY including written plan to identify barriers unique to the patient due to social or economic  reasons was discussed.  Rheumatoid arthritis involving both elbows with positive rheumatoid factor ([place]) Patient is stable and does not want any DMARD  Depression, major, single episode, mild ([place]) Patient's depression is uncontrolled with citalopram.   Anhedonia worse.  PHQ 9 was performed score 18. An individual care plan was established or reinforced today.  The patient's disease status was assessed using clinical findings on exam, labs, and or other diagnostic testing to determine patient's success in meeting treatment goals based on disease specific evidence-based guidelines and found to be worsening Recommendations include stay on medicine  Diabetic glomerulopathy ([place]) -     POCT URINALYSIS DIP (CLINITEK) -     Lipid panel -     Continuous Blood Gluc Sensor (FREESTYLE LIBRE 14 DAY SENSOR) MISC; 1 each by Does not apply route in the morning, at noon, and at bedtime. -     Continuous Blood Gluc Receiver (FREESTYLE LIBRE 14 DAY READER) DEVI; 1 each by Does not apply route 3 x daily with food for 3 doses. -     Dulaglutide (TRULICITY) 6.75 FF/6.3WG SOPN; Inject 0.75 mg into the skin once a week. An individual care plan for diabetes was established and reinforced today.  The patient's status was assessed using clinical findings on exam, labs and diagnostic testing. Patient success at meeting goals based on disease specific evidence-based guidelines and found to be poor using insulin, she is unable to manage insulin, try change to trulicity 6.65LD controlled. Medications were assessed and patient's understanding of the medical issues , including barriers were assessed. Recommend adherence to a diabetic diet, a graduated  exercise program, HgbA1c level is checked  quarterly, and urine microalbumin performed yearly .  Annual mono-filament sensation testing performed. Lower blood pressure and control hyperlipidemia is important. Get annual eye exams and annual flu shots and smoking cessation discussed.  Self management goals were discussed.  Essential hypertension, benign -     CBC with Differential/Platelet -     Comprehensive metabolic panel An individual hypertension care plan was established and reinforced today.  The patient's status was assessed using clinical findings on exam and labs or diagnostic tests. The patient's success at meeting treatment goals on disease specific evidence-based guidelines and found to be fair controlled. SELF MANAGEMENT: The patient and I together assessed ways to personally work towards obtaining the recommended goals. RECOMMENDATIONS: avoid decongestants found in common cold remedies, decrease consumption of alcohol, perform routine monitoring of BP with home BP cuff, exercise, reduction of dietary salt, take medicines as prescribed, try not to miss doses and quit smoking.  Regular exercise and maintaining a healthy weight is needed.  Stress reduction may help. A CLINICAL SUMMARY including written plan identify barriers to care unique to individual due to social or financial issues.  We attempt to mutually creat solutions for individual and family understanding.  Late onset Alzheimer's dementia without behavioral disturbance (HCC) -     donepezil (ARICEPT) 10 MG tablet; Take 1 tablet (10 mg total) by mouth at bedtime Patient is forgetting easily and forgetting insulin.  We discussed assisted living and a more favorable living conditions.  I spoke with daughter      Orders Placed This Encounter  Procedures  . CBC with Differential/Platelet  . Comprehensive metabolic panel  . Hemoglobin A1c  . Lipid panel  . POCT URINALYSIS DIP (CLINITEK)     Follow-up: Return in about 2 weeks  (around 09/28/2020) for dm.  An After Visit Summary was printed and given to the patient.  Reinaldo Meeker, MD [facility] [phone]

## 2020-09-15 LAB — CARDIOVASCULAR RISK ASSESSMENT

## 2020-09-15 LAB — CBC WITH DIFFERENTIAL/PLATELET
Basophils Absolute: 0.1 10*3/uL (ref 0.0–0.2)
Basos: 1 %
EOS (ABSOLUTE): 0.2 10*3/uL (ref 0.0–0.4)
Eos: 3 %
Hematocrit: 31.5 % — ABNORMAL LOW (ref 34.0–46.6)
Hemoglobin: 10.2 g/dL — ABNORMAL LOW (ref 11.1–15.9)
Immature Grans (Abs): 0 10*3/uL (ref 0.0–0.1)
Immature Granulocytes: 0 %
Lymphocytes Absolute: 1.5 10*3/uL (ref 0.7–3.1)
Lymphs: 22 %
MCH: 29.7 pg (ref 26.6–33.0)
MCHC: 32.4 g/dL (ref 31.5–35.7)
MCV: 92 fL (ref 79–97)
Monocytes Absolute: 0.5 10*3/uL (ref 0.1–0.9)
Monocytes: 8 %
Neutrophils Absolute: 4.5 10*3/uL (ref 1.4–7.0)
Neutrophils: 66 %
Platelets: 260 10*3/uL (ref 150–450)
RBC: 3.44 x10E6/uL — ABNORMAL LOW (ref 3.77–5.28)
RDW: 12.2 % (ref 11.7–15.4)
WBC: 6.8 10*3/uL (ref 3.4–10.8)

## 2020-09-15 LAB — COMPREHENSIVE METABOLIC PANEL
ALT: 18 IU/L (ref 0–32)
AST: 19 IU/L (ref 0–40)
Albumin/Globulin Ratio: 2.3 — ABNORMAL HIGH (ref 1.2–2.2)
Albumin: 4.2 g/dL (ref 3.8–4.8)
Alkaline Phosphatase: 74 IU/L (ref 44–121)
BUN/Creatinine Ratio: 22 (ref 12–28)
BUN: 34 mg/dL — ABNORMAL HIGH (ref 8–27)
Bilirubin Total: <0.2 mg/dL (ref 0.0–1.2)
CO2: 22 mmol/L (ref 20–29)
Calcium: 9.1 mg/dL (ref 8.7–10.3)
Chloride: 103 mmol/L (ref 96–106)
Creatinine, Ser: 1.53 mg/dL — ABNORMAL HIGH (ref 0.57–1.00)
Globulin, Total: 1.8 g/dL (ref 1.5–4.5)
Glucose: 86 mg/dL (ref 65–99)
Potassium: 4.6 mmol/L (ref 3.5–5.2)
Sodium: 143 mmol/L (ref 134–144)
Total Protein: 6 g/dL (ref 6.0–8.5)
eGFR: 37 mL/min/{1.73_m2} — ABNORMAL LOW (ref >59)

## 2020-09-15 LAB — LIPID PANEL
Chol/HDL Ratio: 2.5 ratio (ref 0.0–4.4)
Cholesterol, Total: 173 mg/dL (ref 100–199)
HDL: 70 mg/dL (ref >39)
LDL Chol Calc (NIH): 80 mg/dL (ref 0–99)
Triglycerides: 132 mg/dL (ref 0–149)
VLDL Cholesterol Cal: 23 mg/dL (ref 5–40)

## 2020-09-15 LAB — HEMOGLOBIN A1C
Est. average glucose Bld gHb Est-mCnc: 209 mg/dL
Hgb A1c MFr Bld: 8.9 % — ABNORMAL HIGH (ref 4.8–5.6)

## 2020-09-15 NOTE — Progress Notes (Signed)
Anemia hemoglobin 10.2, add ferritin level, kidney stage 3b level, kidney tests good, A1c 8.9 want < 8.0, LDLc cholesterol 168 high lp

## 2020-09-16 DIAGNOSIS — E860 Dehydration: Secondary | ICD-10-CM | POA: Diagnosis not present

## 2020-09-16 DIAGNOSIS — D631 Anemia in chronic kidney disease: Secondary | ICD-10-CM | POA: Diagnosis not present

## 2020-09-16 DIAGNOSIS — G9341 Metabolic encephalopathy: Secondary | ICD-10-CM | POA: Diagnosis not present

## 2020-09-16 DIAGNOSIS — N170 Acute kidney failure with tubular necrosis: Secondary | ICD-10-CM | POA: Diagnosis not present

## 2020-09-16 DIAGNOSIS — N390 Urinary tract infection, site not specified: Secondary | ICD-10-CM | POA: Diagnosis not present

## 2020-09-16 DIAGNOSIS — B965 Pseudomonas (aeruginosa) (mallei) (pseudomallei) as the cause of diseases classified elsewhere: Secondary | ICD-10-CM | POA: Diagnosis not present

## 2020-09-16 DIAGNOSIS — N1832 Chronic kidney disease, stage 3b: Secondary | ICD-10-CM | POA: Diagnosis not present

## 2020-09-16 DIAGNOSIS — E1122 Type 2 diabetes mellitus with diabetic chronic kidney disease: Secondary | ICD-10-CM | POA: Diagnosis not present

## 2020-09-16 DIAGNOSIS — I129 Hypertensive chronic kidney disease with stage 1 through stage 4 chronic kidney disease, or unspecified chronic kidney disease: Secondary | ICD-10-CM | POA: Diagnosis not present

## 2020-09-19 DIAGNOSIS — I129 Hypertensive chronic kidney disease with stage 1 through stage 4 chronic kidney disease, or unspecified chronic kidney disease: Secondary | ICD-10-CM | POA: Diagnosis not present

## 2020-09-19 DIAGNOSIS — N390 Urinary tract infection, site not specified: Secondary | ICD-10-CM | POA: Diagnosis not present

## 2020-09-19 DIAGNOSIS — B965 Pseudomonas (aeruginosa) (mallei) (pseudomallei) as the cause of diseases classified elsewhere: Secondary | ICD-10-CM | POA: Diagnosis not present

## 2020-09-19 DIAGNOSIS — E1122 Type 2 diabetes mellitus with diabetic chronic kidney disease: Secondary | ICD-10-CM | POA: Diagnosis not present

## 2020-09-19 DIAGNOSIS — D631 Anemia in chronic kidney disease: Secondary | ICD-10-CM | POA: Diagnosis not present

## 2020-09-19 DIAGNOSIS — N1832 Chronic kidney disease, stage 3b: Secondary | ICD-10-CM | POA: Diagnosis not present

## 2020-09-19 DIAGNOSIS — N170 Acute kidney failure with tubular necrosis: Secondary | ICD-10-CM | POA: Diagnosis not present

## 2020-09-19 DIAGNOSIS — G9341 Metabolic encephalopathy: Secondary | ICD-10-CM | POA: Diagnosis not present

## 2020-09-19 DIAGNOSIS — E860 Dehydration: Secondary | ICD-10-CM | POA: Diagnosis not present

## 2020-09-22 DIAGNOSIS — N170 Acute kidney failure with tubular necrosis: Secondary | ICD-10-CM | POA: Diagnosis not present

## 2020-09-22 DIAGNOSIS — G9341 Metabolic encephalopathy: Secondary | ICD-10-CM | POA: Diagnosis not present

## 2020-09-22 DIAGNOSIS — E1122 Type 2 diabetes mellitus with diabetic chronic kidney disease: Secondary | ICD-10-CM | POA: Diagnosis not present

## 2020-09-22 DIAGNOSIS — B965 Pseudomonas (aeruginosa) (mallei) (pseudomallei) as the cause of diseases classified elsewhere: Secondary | ICD-10-CM | POA: Diagnosis not present

## 2020-09-22 DIAGNOSIS — N390 Urinary tract infection, site not specified: Secondary | ICD-10-CM | POA: Diagnosis not present

## 2020-09-22 DIAGNOSIS — D631 Anemia in chronic kidney disease: Secondary | ICD-10-CM | POA: Diagnosis not present

## 2020-09-22 DIAGNOSIS — I129 Hypertensive chronic kidney disease with stage 1 through stage 4 chronic kidney disease, or unspecified chronic kidney disease: Secondary | ICD-10-CM | POA: Diagnosis not present

## 2020-09-22 DIAGNOSIS — E860 Dehydration: Secondary | ICD-10-CM | POA: Diagnosis not present

## 2020-09-22 DIAGNOSIS — N1832 Chronic kidney disease, stage 3b: Secondary | ICD-10-CM | POA: Diagnosis not present

## 2020-09-22 LAB — FERRITIN: Ferritin: 27 ng/mL (ref 15–150)

## 2020-09-22 LAB — SPECIMEN STATUS REPORT

## 2020-09-26 ENCOUNTER — Other Ambulatory Visit: Payer: Self-pay

## 2020-09-26 DIAGNOSIS — N1832 Chronic kidney disease, stage 3b: Secondary | ICD-10-CM | POA: Diagnosis not present

## 2020-09-26 DIAGNOSIS — N170 Acute kidney failure with tubular necrosis: Secondary | ICD-10-CM | POA: Diagnosis not present

## 2020-09-26 DIAGNOSIS — B965 Pseudomonas (aeruginosa) (mallei) (pseudomallei) as the cause of diseases classified elsewhere: Secondary | ICD-10-CM | POA: Diagnosis not present

## 2020-09-26 DIAGNOSIS — G9341 Metabolic encephalopathy: Secondary | ICD-10-CM | POA: Diagnosis not present

## 2020-09-26 DIAGNOSIS — D631 Anemia in chronic kidney disease: Secondary | ICD-10-CM | POA: Diagnosis not present

## 2020-09-26 DIAGNOSIS — E1122 Type 2 diabetes mellitus with diabetic chronic kidney disease: Secondary | ICD-10-CM | POA: Diagnosis not present

## 2020-09-26 DIAGNOSIS — I129 Hypertensive chronic kidney disease with stage 1 through stage 4 chronic kidney disease, or unspecified chronic kidney disease: Secondary | ICD-10-CM | POA: Diagnosis not present

## 2020-09-26 DIAGNOSIS — E860 Dehydration: Secondary | ICD-10-CM | POA: Diagnosis not present

## 2020-09-26 DIAGNOSIS — N390 Urinary tract infection, site not specified: Secondary | ICD-10-CM | POA: Diagnosis not present

## 2020-09-26 MED ORDER — ONDANSETRON HCL 4 MG PO TABS: 4.0000 mg | ORAL_TABLET | Freq: Three times a day (TID) | ORAL | 0 refills | Status: DC | PRN

## 2020-09-27 ENCOUNTER — Encounter: Payer: Self-pay | Admitting: Legal Medicine

## 2020-09-27 ENCOUNTER — Ambulatory Visit (INDEPENDENT_AMBULATORY_CARE_PROVIDER_SITE_OTHER): Payer: Medicare PPO | Admitting: Legal Medicine

## 2020-09-27 ENCOUNTER — Other Ambulatory Visit: Payer: Self-pay

## 2020-09-27 VITALS — BP 80/50 | HR 122 | Temp 97.4°F | Resp 16 | Ht 64.0 in | Wt 104.0 lb

## 2020-09-27 DIAGNOSIS — N3000 Acute cystitis without hematuria: Secondary | ICD-10-CM | POA: Diagnosis not present

## 2020-09-27 DIAGNOSIS — E1122 Type 2 diabetes mellitus with diabetic chronic kidney disease: Secondary | ICD-10-CM | POA: Diagnosis not present

## 2020-09-27 DIAGNOSIS — K2100 Gastro-esophageal reflux disease with esophagitis, without bleeding: Secondary | ICD-10-CM | POA: Diagnosis not present

## 2020-09-27 DIAGNOSIS — I129 Hypertensive chronic kidney disease with stage 1 through stage 4 chronic kidney disease, or unspecified chronic kidney disease: Secondary | ICD-10-CM | POA: Diagnosis not present

## 2020-09-27 LAB — POCT URINALYSIS DIP (CLINITEK)
Bilirubin, UA: NEGATIVE
Blood, UA: NEGATIVE
Glucose, UA: 100 mg/dL — AB
Ketones, POC UA: NEGATIVE mg/dL
Leukocytes, UA: NEGATIVE
Nitrite, UA: NEGATIVE
POC PROTEIN,UA: >=300 — AB
Spec Grav, UA: >=1.03 — AB (ref 1.010–1.025)
Urobilinogen, UA: 0.2 E.U./dL
pH, UA: 5 (ref 5.0–8.0)

## 2020-09-27 MED ORDER — DOXYCYCLINE HYCLATE 100 MG PO TABS: 100.0000 mg | ORAL_TABLET | Freq: Two times a day (BID) | ORAL | 0 refills | Status: DC

## 2020-09-27 MED ORDER — METOCLOPRAMIDE HCL 5 MG PO TABS: 5.0000 mg | ORAL_TABLET | Freq: Four times a day (QID) | ORAL | 2 refills | Status: DC

## 2020-09-27 MED ORDER — OMEPRAZOLE 40 MG PO CPDR: 1.0000 | DELAYED_RELEASE_CAPSULE | Freq: Two times a day (BID) | ORAL | 2 refills | Status: DC

## 2020-09-27 NOTE — Progress Notes (Signed)
Established Patient Office Visit  Subjective:  Patient ID: Donna Moran, female    DOB: November 13, 1955  Age: 65 y.o. MRN: [id number]  CC:  Chief Complaint  Patient presents with   Hyperglycemia    Today was 264 mg/dl and yesterday 302 mg/dl   Anorexia   Abdominal Pain    HPI Donna Moran presents for abdominal pain.  Had for 6 months with abdominal pain and vomiting. Vomiting occurs when she gets out of bed in AM. She eats very little due to post prandial pain. It was not worsened by Trulicity. 25 lb weight loss. She does not feel hungry.  She has recurrent UTIs.- UA today negative  Past Medical History:  Diagnosis Date   Diabetes mellitus without complication ([place])    History of blood transfusion    Hyperlipidemia     Past Surgical History:  Procedure Laterality Date   CATARACT EXTRACTION Bilateral 04/2013-05/2013    Family History  Problem Relation Age of Onset   Cancer Neg Hx    Diabetes Neg Hx    Stroke Neg Hx     Social History   Socioeconomic History   Marital status: Married    Spouse name: Not on file   Number of children: 2   Years of education: Not on file   Highest education level: Not on file  Occupational History   Occupation: Unemployed    Employer: NOT EMPLOYED  Tobacco Use   Smoking status: Former    Pack years: 0.00   Smokeless tobacco: Never  Substance and Sexual Activity   Alcohol use: No   Drug use: No   Sexual activity: Yes  Other Topics Concern   Not on file  Social History Narrative   Regular exercise-no   Caffeine Use-yes   Social Determinants of Health   Financial Resource Strain: Not on file  Food Insecurity: Not on file  Transportation Needs: Not on file  Physical Activity: Not on file  Stress: Not on file  Social Connections: Not on file  Intimate Partner Violence: Not on file    Outpatient Medications Prior to Visit  Medication Sig Dispense Refill   atorvastatin (LIPITOR) 40 MG tablet Take 40 mg by mouth at  bedtime.     B-D UF III MINI PEN NEEDLES 31G X 5 MM MISC Inject 1 each into the skin 3 (three) times daily. 30 each 2   Blood Glucose Monitoring Suppl (ACCU-CHEK GUIDE) w/Device KIT USE TO CHECK GLUCOSE IN THE MORNING AND WITH MEALS     citalopram (CELEXA) 20 MG tablet Take 1 tablet (20 mg total) by mouth daily. 90 tablet 2   Continuous Blood Gluc Sensor (FREESTYLE LIBRE 14 DAY SENSOR) MISC 1 each by Does not apply route in the morning, at noon, and at bedtime. 6 each 3   donepezil (ARICEPT) 10 MG tablet Take 1 tablet (10 mg total) by mouth at bedtime. 90 tablet 2   Dulaglutide (TRULICITY) 4.19 QQ/2.2LN SOPN Inject 0.75 mg into the skin once a week. 4 mL 2   glucose blood (FREESTYLE LITE) test strip 1 each by Other route daily before breakfast. Use as instructed 300 strip 2   lisinopril (ZESTRIL) 2.5 MG tablet Take 1 tablet (2.5 mg total) by mouth daily. 90 tablet 2   metFORMIN (GLUCOPHAGE) 1000 MG tablet TAKE 1 TABLET BY MOUTH TWICE DAILY 180 tablet 2   ondansetron (ZOFRAN) 4 MG tablet Take 1 tablet (4 mg total) by mouth every 8 (eight) hours as needed for  nausea or vomiting. 20 tablet 0   omeprazole (PRILOSEC) 40 MG capsule TAKE 1 CAPSULE BY MOUTH DAILY 90 capsule 2   No facility-administered medications prior to visit.    Allergies  Allergen Reactions   Aspirin Nausea Only    Can take coated aspirin    ROS Review of Systems  Constitutional:  Negative for activity change and appetite change.  HENT:  Negative for congestion and rhinorrhea.   Respiratory:  Negative for chest tightness and shortness of breath.   Gastrointestinal:  Positive for diarrhea and vomiting.  Genitourinary: Negative.   Musculoskeletal:  Negative for arthralgias and back pain.  Neurological: Negative.      Objective:    Physical Exam  BP (!) 80/50   Pulse (!) 122   Temp (!) 97.4 F (36.3 C)   Resp 16   Ht $R'5\' 4"'Zt$  (1.626 m)   Wt 104 lb (47.2 kg)   SpO2 97%   BMI 17.85 kg/m  Wt Readings from Last 3  Encounters:  09/27/20 104 lb (47.2 kg)  09/14/20 115 lb (52.2 kg)  09/03/19 130 lb 6.4 oz (59.1 kg)     Health Maintenance Due  Topic Date Due   Hepatitis C Screening  Never done   Zoster Vaccines- Shingrix (1 of 2) Never done   MAMMOGRAM  03/25/2016   PNA vac Low Risk Adult (2 of 2 - PPSV23) 01/02/2019   COVID-19 Vaccine (3 - Booster for Pfizer series) 02/05/2020    There are no preventive care reminders to display for this patient.  Lab Results  Component Value Date   TSH 0.67 10/07/2012   Lab Results  Component Value Date   WBC 6.8 09/14/2020   HGB 10.2 (L) 09/14/2020   HCT 31.5 (L) 09/14/2020   MCV 92 09/14/2020   PLT 260 09/14/2020   Lab Results  Component Value Date   NA 143 09/14/2020   K 4.6 09/14/2020   CO2 22 09/14/2020   GLUCOSE 86 09/14/2020   BUN 34 (H) 09/14/2020   CREATININE 1.53 (H) 09/14/2020   BILITOT <0.2 09/14/2020   ALKPHOS 74 09/14/2020   AST 19 09/14/2020   ALT 18 09/14/2020   PROT 6.0 09/14/2020   ALBUMIN 4.2 09/14/2020   CALCIUM 9.1 09/14/2020   EGFR 37 (L) 09/14/2020   GFR 71.18 01/26/2015   Lab Results  Component Value Date   CHOL 173 09/14/2020   Lab Results  Component Value Date   HDL 70 09/14/2020   Lab Results  Component Value Date   LDLCALC 80 09/14/2020   Lab Results  Component Value Date   TRIG 132 09/14/2020   Lab Results  Component Value Date   CHOLHDL 2.5 09/14/2020   Lab Results  Component Value Date   HGBA1C 8.9 (H) 09/14/2020      Assessment & Plan:   Diagnoses and all orders for this visit: Gastroesophageal reflux disease with esophagitis without hemorrhage -     Ambulatory referral to Gastroenterology -     omeprazole (PRILOSEC) 40 MG capsule; Take 1 capsule (40 mg total) by mouth 2 (two) times daily. -     metoCLOPramide (REGLAN) 5 MG tablet; Take 1 tablet (5 mg total) by mouth 4 (four) times daily. Referral to GI Dr. Odie Sera for EGD and consult Acute cystitis without hematuria -      doxycycline (VIBRA-TABS) 100 MG tablet; Take 1 tablet (100 mg total) by mouth 2 (two) times daily. -     POCT URINALYSIS DIP (CLINITEK)  Can hold on antibiotics with negative UA   Follow-up: Return in about 1 month (around 10/27/2020).    Reinaldo Meeker, MD

## 2020-09-28 DIAGNOSIS — E1122 Type 2 diabetes mellitus with diabetic chronic kidney disease: Secondary | ICD-10-CM | POA: Diagnosis not present

## 2020-09-28 DIAGNOSIS — D631 Anemia in chronic kidney disease: Secondary | ICD-10-CM | POA: Diagnosis not present

## 2020-09-28 DIAGNOSIS — G9341 Metabolic encephalopathy: Secondary | ICD-10-CM | POA: Diagnosis not present

## 2020-09-28 DIAGNOSIS — N1832 Chronic kidney disease, stage 3b: Secondary | ICD-10-CM | POA: Diagnosis not present

## 2020-09-28 DIAGNOSIS — N170 Acute kidney failure with tubular necrosis: Secondary | ICD-10-CM | POA: Diagnosis not present

## 2020-09-28 DIAGNOSIS — I129 Hypertensive chronic kidney disease with stage 1 through stage 4 chronic kidney disease, or unspecified chronic kidney disease: Secondary | ICD-10-CM | POA: Diagnosis not present

## 2020-09-28 DIAGNOSIS — E860 Dehydration: Secondary | ICD-10-CM | POA: Diagnosis not present

## 2020-09-28 DIAGNOSIS — N390 Urinary tract infection, site not specified: Secondary | ICD-10-CM | POA: Diagnosis not present

## 2020-09-28 DIAGNOSIS — B965 Pseudomonas (aeruginosa) (mallei) (pseudomallei) as the cause of diseases classified elsewhere: Secondary | ICD-10-CM | POA: Diagnosis not present

## 2020-09-29 DIAGNOSIS — D631 Anemia in chronic kidney disease: Secondary | ICD-10-CM | POA: Diagnosis not present

## 2020-09-29 DIAGNOSIS — G9341 Metabolic encephalopathy: Secondary | ICD-10-CM | POA: Diagnosis not present

## 2020-09-29 DIAGNOSIS — B965 Pseudomonas (aeruginosa) (mallei) (pseudomallei) as the cause of diseases classified elsewhere: Secondary | ICD-10-CM | POA: Diagnosis not present

## 2020-09-29 DIAGNOSIS — N390 Urinary tract infection, site not specified: Secondary | ICD-10-CM | POA: Diagnosis not present

## 2020-09-29 DIAGNOSIS — I129 Hypertensive chronic kidney disease with stage 1 through stage 4 chronic kidney disease, or unspecified chronic kidney disease: Secondary | ICD-10-CM | POA: Diagnosis not present

## 2020-09-29 DIAGNOSIS — E860 Dehydration: Secondary | ICD-10-CM | POA: Diagnosis not present

## 2020-09-29 DIAGNOSIS — N170 Acute kidney failure with tubular necrosis: Secondary | ICD-10-CM | POA: Diagnosis not present

## 2020-09-29 DIAGNOSIS — N1832 Chronic kidney disease, stage 3b: Secondary | ICD-10-CM | POA: Diagnosis not present

## 2020-09-29 DIAGNOSIS — E1122 Type 2 diabetes mellitus with diabetic chronic kidney disease: Secondary | ICD-10-CM | POA: Diagnosis not present

## 2020-09-30 DIAGNOSIS — G9341 Metabolic encephalopathy: Secondary | ICD-10-CM | POA: Diagnosis not present

## 2020-09-30 DIAGNOSIS — D631 Anemia in chronic kidney disease: Secondary | ICD-10-CM | POA: Diagnosis not present

## 2020-09-30 DIAGNOSIS — N1832 Chronic kidney disease, stage 3b: Secondary | ICD-10-CM | POA: Diagnosis not present

## 2020-09-30 DIAGNOSIS — B965 Pseudomonas (aeruginosa) (mallei) (pseudomallei) as the cause of diseases classified elsewhere: Secondary | ICD-10-CM | POA: Diagnosis not present

## 2020-09-30 DIAGNOSIS — E1122 Type 2 diabetes mellitus with diabetic chronic kidney disease: Secondary | ICD-10-CM | POA: Diagnosis not present

## 2020-09-30 DIAGNOSIS — N170 Acute kidney failure with tubular necrosis: Secondary | ICD-10-CM | POA: Diagnosis not present

## 2020-09-30 DIAGNOSIS — E860 Dehydration: Secondary | ICD-10-CM | POA: Diagnosis not present

## 2020-09-30 DIAGNOSIS — I129 Hypertensive chronic kidney disease with stage 1 through stage 4 chronic kidney disease, or unspecified chronic kidney disease: Secondary | ICD-10-CM | POA: Diagnosis not present

## 2020-09-30 DIAGNOSIS — N390 Urinary tract infection, site not specified: Secondary | ICD-10-CM | POA: Diagnosis not present

## 2020-10-03 ENCOUNTER — Telehealth: Payer: Self-pay

## 2020-10-03 NOTE — Telephone Encounter (Signed)
Donna Moran w/ [facility] home health OT calling for verbal orders. Schedule once a week for four weeks. Gave verbal orders.   Royce Macadamia, [place] 10/03/20 2:16 PM

## 2020-10-05 DIAGNOSIS — D631 Anemia in chronic kidney disease: Secondary | ICD-10-CM | POA: Diagnosis not present

## 2020-10-05 DIAGNOSIS — I129 Hypertensive chronic kidney disease with stage 1 through stage 4 chronic kidney disease, or unspecified chronic kidney disease: Secondary | ICD-10-CM | POA: Diagnosis not present

## 2020-10-05 DIAGNOSIS — E1122 Type 2 diabetes mellitus with diabetic chronic kidney disease: Secondary | ICD-10-CM | POA: Diagnosis not present

## 2020-10-05 DIAGNOSIS — E860 Dehydration: Secondary | ICD-10-CM | POA: Diagnosis not present

## 2020-10-05 DIAGNOSIS — N390 Urinary tract infection, site not specified: Secondary | ICD-10-CM | POA: Diagnosis not present

## 2020-10-05 DIAGNOSIS — G9341 Metabolic encephalopathy: Secondary | ICD-10-CM | POA: Diagnosis not present

## 2020-10-05 DIAGNOSIS — B965 Pseudomonas (aeruginosa) (mallei) (pseudomallei) as the cause of diseases classified elsewhere: Secondary | ICD-10-CM | POA: Diagnosis not present

## 2020-10-05 DIAGNOSIS — N170 Acute kidney failure with tubular necrosis: Secondary | ICD-10-CM | POA: Diagnosis not present

## 2020-10-05 DIAGNOSIS — N1832 Chronic kidney disease, stage 3b: Secondary | ICD-10-CM | POA: Diagnosis not present

## 2020-10-06 DIAGNOSIS — N1832 Chronic kidney disease, stage 3b: Secondary | ICD-10-CM | POA: Diagnosis not present

## 2020-10-06 DIAGNOSIS — I129 Hypertensive chronic kidney disease with stage 1 through stage 4 chronic kidney disease, or unspecified chronic kidney disease: Secondary | ICD-10-CM | POA: Diagnosis not present

## 2020-10-06 DIAGNOSIS — E1122 Type 2 diabetes mellitus with diabetic chronic kidney disease: Secondary | ICD-10-CM | POA: Diagnosis not present

## 2020-10-06 DIAGNOSIS — G9341 Metabolic encephalopathy: Secondary | ICD-10-CM | POA: Diagnosis not present

## 2020-10-06 DIAGNOSIS — B965 Pseudomonas (aeruginosa) (mallei) (pseudomallei) as the cause of diseases classified elsewhere: Secondary | ICD-10-CM | POA: Diagnosis not present

## 2020-10-06 DIAGNOSIS — E860 Dehydration: Secondary | ICD-10-CM | POA: Diagnosis not present

## 2020-10-06 DIAGNOSIS — D631 Anemia in chronic kidney disease: Secondary | ICD-10-CM | POA: Diagnosis not present

## 2020-10-06 DIAGNOSIS — N170 Acute kidney failure with tubular necrosis: Secondary | ICD-10-CM | POA: Diagnosis not present

## 2020-10-06 DIAGNOSIS — N390 Urinary tract infection, site not specified: Secondary | ICD-10-CM | POA: Diagnosis not present

## 2020-10-07 ENCOUNTER — Telehealth: Payer: Self-pay

## 2020-10-07 NOTE — Telephone Encounter (Signed)
Donna Moran, [facility] PT calling for verbal orders. Working w/ pt on balance for once a week for four weeks. Verbal orders given.   [name] 10/07/20 8:53 AM

## 2020-10-10 ENCOUNTER — Encounter: Payer: Self-pay | Admitting: Legal Medicine

## 2020-10-10 ENCOUNTER — Other Ambulatory Visit: Payer: Self-pay

## 2020-10-10 ENCOUNTER — Ambulatory Visit (INDEPENDENT_AMBULATORY_CARE_PROVIDER_SITE_OTHER): Payer: Medicare PPO | Admitting: Legal Medicine

## 2020-10-10 VITALS — BP 96/56 | Temp 97.1°F | Resp 15 | Ht 64.0 in | Wt 102.0 lb

## 2020-10-10 DIAGNOSIS — E440 Moderate protein-calorie malnutrition: Secondary | ICD-10-CM | POA: Insufficient documentation

## 2020-10-10 DIAGNOSIS — E1121 Type 2 diabetes mellitus with diabetic nephropathy: Secondary | ICD-10-CM | POA: Diagnosis not present

## 2020-10-10 DIAGNOSIS — Z1231 Encounter for screening mammogram for malignant neoplasm of breast: Secondary | ICD-10-CM | POA: Diagnosis not present

## 2020-10-10 LAB — GLUCOSE, POCT (MANUAL RESULT ENTRY): POC Glucose: 97 mg/dl (ref 70–99)

## 2020-10-10 MED ORDER — MEGESTROL ACETATE 20 MG PO TABS: 20.0000 mg | ORAL_TABLET | Freq: Every day | ORAL | 3 refills | Status: DC

## 2020-10-10 NOTE — Progress Notes (Signed)
Established Patient Office Visit  Subjective:  Patient ID: Donna Moran, female    DOB: [birth year]  Age: 65 y.o. MRN: [id number]  CC:  Chief Complaint  Patient presents with   Anorexia   Diabetes    HPI Donna Moran presents for diabetes, she stopped insulin, starting trulicity. BS 55 this Am, 96 BS now.  He ate now.  She still is not eating well.  Past Medical History:  Diagnosis Date   Diabetes mellitus without complication ([place])    History of blood transfusion    Hyperlipidemia     Past Surgical History:  Procedure Laterality Date   CATARACT EXTRACTION Bilateral 04/2013-05/2013    Family History  Problem Relation Age of Onset   Cancer Neg Hx    Diabetes Neg Hx    Stroke Neg Hx     Social History   Socioeconomic History   Marital status: Married    Spouse name: Not on file   Number of children: 2   Years of education: Not on file   Highest education level: Not on file  Occupational History   Occupation: Unemployed    Employer: NOT EMPLOYED  Tobacco Use   Smoking status: Former    Pack years: 0.00   Smokeless tobacco: Never  Substance and Sexual Activity   Alcohol use: No   Drug use: No   Sexual activity: Yes  Other Topics Concern   Not on file  Social History Narrative   Regular exercise-no   Caffeine Use-yes   Social Determinants of Health   Financial Resource Strain: Not on file  Food Insecurity: Not on file  Transportation Needs: Not on file  Physical Activity: Not on file  Stress: Not on file  Social Connections: Not on file  Intimate Partner Violence: Not on file    Outpatient Medications Prior to Visit  Medication Sig Dispense Refill   atorvastatin (LIPITOR) 40 MG tablet Take 40 mg by mouth at bedtime.     B-D UF III MINI PEN NEEDLES 31G X 5 MM MISC Inject 1 each into the skin 3 (three) times daily. 30 each 2   Blood Glucose Monitoring Suppl (ACCU-CHEK GUIDE) w/Device KIT USE TO CHECK GLUCOSE IN THE MORNING AND WITH MEALS      citalopram (CELEXA) 20 MG tablet Take 1 tablet (20 mg total) by mouth daily. 90 tablet 2   Continuous Blood Gluc Sensor (FREESTYLE LIBRE 14 DAY SENSOR) MISC 1 each by Does not apply route in the morning, at noon, and at bedtime. 6 each 3   donepezil (ARICEPT) 10 MG tablet Take 1 tablet (10 mg total) by mouth at bedtime. 90 tablet 2   Dulaglutide (TRULICITY) 3.76 EG/3.1DV SOPN Inject 0.75 mg into the skin once a week. 4 mL 2   glucose blood (FREESTYLE LITE) test strip 1 each by Other route daily before breakfast. Use as instructed 300 strip 2   lisinopril (ZESTRIL) 2.5 MG tablet Take 1 tablet (2.5 mg total) by mouth daily. 90 tablet 2   metFORMIN (GLUCOPHAGE) 1000 MG tablet TAKE 1 TABLET BY MOUTH TWICE DAILY 180 tablet 2   metoCLOPramide (REGLAN) 5 MG tablet Take 1 tablet (5 mg total) by mouth 4 (four) times daily. 120 tablet 2   omeprazole (PRILOSEC) 40 MG capsule Take 1 capsule (40 mg total) by mouth 2 (two) times daily. 180 capsule 2   ondansetron (ZOFRAN) 4 MG tablet Take 1 tablet (4 mg total) by mouth every 8 (eight) hours as needed for nausea  or vomiting. 20 tablet 0   rosuvastatin (CRESTOR) 10 MG tablet Take 10 mg by mouth at bedtime.     doxycycline (VIBRA-TABS) 100 MG tablet Take 1 tablet (100 mg total) by mouth 2 (two) times daily. 20 tablet 0   No facility-administered medications prior to visit.    Allergies  Allergen Reactions   Aspirin Nausea Only    Can take coated aspirin    ROS Review of Systems  Constitutional:  Positive for appetite change. Negative for activity change.  HENT:  Negative for congestion.   Eyes:  Negative for visual disturbance.  Respiratory:  Negative for chest tightness and shortness of breath.   Cardiovascular:  Negative for chest pain, palpitations and leg swelling.  Gastrointestinal:  Negative for abdominal distention and abdominal pain.  Genitourinary:  Negative for difficulty urinating and dysuria.  Musculoskeletal:  Negative for arthralgias  and back pain.  Neurological: Negative.   Psychiatric/Behavioral: Negative.       Objective:    Physical Exam Vitals reviewed.  Constitutional:      Appearance: Normal appearance.  HENT:     Head: Normocephalic.     Right Ear: Tympanic membrane, ear canal and external ear normal.     Left Ear: Tympanic membrane, ear canal and external ear normal.     Mouth/Throat:     Mouth: Mucous membranes are moist.     Pharynx: Oropharynx is clear.  Eyes:     Extraocular Movements: Extraocular movements intact.     Conjunctiva/sclera: Conjunctivae normal.     Pupils: Pupils are equal, round, and reactive to light.  Cardiovascular:     Rate and Rhythm: Normal rate and regular rhythm.     Pulses: Normal pulses.     Heart sounds: Normal heart sounds. No murmur heard.   No gallop.  Pulmonary:     Effort: Pulmonary effort is normal. No respiratory distress.     Breath sounds: Normal breath sounds. No wheezing.  Abdominal:     General: Abdomen is flat. Bowel sounds are normal. There is no distension.     Palpations: Abdomen is soft.     Tenderness: There is no abdominal tenderness.  Musculoskeletal:     Cervical back: Normal range of motion and neck supple.  Skin:    General: Skin is warm.     Capillary Refill: Capillary refill takes less than 2 seconds.  Neurological:     General: No focal deficit present.     Mental Status: She is alert and oriented to person, place, and time. Mental status is at baseline.  Psychiatric:        Mood and Affect: Mood normal.    BP (!) 96/56   Temp (!) 97.1 F (36.2 C)   Resp 15   Ht _0  (1.626 m)   Wt 102 lb (46.3 kg)   BMI 17.51 kg/m  Wt Readings from Last 3 Encounters:  10/10/20 102 lb (46.3 kg)  09/27/20 104 lb (47.2 kg)  09/14/20 115 lb (52.2 kg)     Health Maintenance Due  Topic Date Due   Hepatitis C Screening  Never done   Zoster Vaccines- Shingrix (1 of 2) Never done   MAMMOGRAM  03/25/2016   PNA vac Low Risk Adult (2 of 2 -  PPSV23) 01/02/2019   COVID-19 Vaccine (3 - Booster for Pfizer series) 02/05/2020   OPHTHALMOLOGY EXAM  09/28/2020    There are no preventive care reminders to display for this patient.  Lab Results  Component Value Date   TSH 0.67 10/07/2012   Lab Results  Component Value Date   WBC 6.8 09/14/2020   HGB 10.2 (L) 09/14/2020   HCT 31.5 (L) 09/14/2020   MCV 92 09/14/2020   PLT 260 09/14/2020   Lab Results  Component Value Date   NA 143 09/14/2020   K 4.6 09/14/2020   CO2 22 09/14/2020   GLUCOSE 86 09/14/2020   BUN 34 (H) 09/14/2020   CREATININE 1.53 (H) 09/14/2020   BILITOT <0.2 09/14/2020   ALKPHOS 74 09/14/2020   AST 19 09/14/2020   ALT 18 09/14/2020   PROT 6.0 09/14/2020   ALBUMIN 4.2 09/14/2020   CALCIUM 9.1 09/14/2020   EGFR 37 (L) 09/14/2020   GFR 71.18 01/26/2015   Lab Results  Component Value Date   CHOL 173 09/14/2020   Lab Results  Component Value Date   HDL 70 09/14/2020   Lab Results  Component Value Date   LDLCALC 80 09/14/2020   Lab Results  Component Value Date   TRIG 132 09/14/2020   Lab Results  Component Value Date   CHOLHDL 2.5 09/14/2020   Lab Results  Component Value Date   HGBA1C 8.9 (H) 09/14/2020      Assessment & Plan:   Diagnoses and all orders for this visit: Diabetic glomerulopathy ([place]) -     Glucose (CBG) Patient is having some hypoglycemia but she is not eating. We discussed this at length.  Daughter is present.  Malnutrition of moderate degree (HCC) -     megestrol (MEGACE) 20 MG tablet; Take 1 tablet (20 mg total) by mouth daily.  Even marijuana brownies to increase appetitie Try to increase appetite, try megace,     Follow-up: Return in about 1 month (around 11/09/2020) for diabetes.    Reinaldo Meeker, MD

## 2020-10-11 DIAGNOSIS — E1122 Type 2 diabetes mellitus with diabetic chronic kidney disease: Secondary | ICD-10-CM | POA: Diagnosis not present

## 2020-10-11 DIAGNOSIS — B965 Pseudomonas (aeruginosa) (mallei) (pseudomallei) as the cause of diseases classified elsewhere: Secondary | ICD-10-CM | POA: Diagnosis not present

## 2020-10-11 DIAGNOSIS — I129 Hypertensive chronic kidney disease with stage 1 through stage 4 chronic kidney disease, or unspecified chronic kidney disease: Secondary | ICD-10-CM | POA: Diagnosis not present

## 2020-10-11 DIAGNOSIS — D631 Anemia in chronic kidney disease: Secondary | ICD-10-CM | POA: Diagnosis not present

## 2020-10-11 DIAGNOSIS — E860 Dehydration: Secondary | ICD-10-CM | POA: Diagnosis not present

## 2020-10-11 DIAGNOSIS — N1832 Chronic kidney disease, stage 3b: Secondary | ICD-10-CM | POA: Diagnosis not present

## 2020-10-11 DIAGNOSIS — G9341 Metabolic encephalopathy: Secondary | ICD-10-CM | POA: Diagnosis not present

## 2020-10-11 DIAGNOSIS — N390 Urinary tract infection, site not specified: Secondary | ICD-10-CM | POA: Diagnosis not present

## 2020-10-11 DIAGNOSIS — N170 Acute kidney failure with tubular necrosis: Secondary | ICD-10-CM | POA: Diagnosis not present

## 2020-10-12 DIAGNOSIS — D631 Anemia in chronic kidney disease: Secondary | ICD-10-CM | POA: Diagnosis not present

## 2020-10-12 DIAGNOSIS — E1122 Type 2 diabetes mellitus with diabetic chronic kidney disease: Secondary | ICD-10-CM | POA: Diagnosis not present

## 2020-10-12 DIAGNOSIS — N1832 Chronic kidney disease, stage 3b: Secondary | ICD-10-CM | POA: Diagnosis not present

## 2020-10-12 DIAGNOSIS — G9341 Metabolic encephalopathy: Secondary | ICD-10-CM | POA: Diagnosis not present

## 2020-10-12 DIAGNOSIS — N390 Urinary tract infection, site not specified: Secondary | ICD-10-CM | POA: Diagnosis not present

## 2020-10-12 DIAGNOSIS — B965 Pseudomonas (aeruginosa) (mallei) (pseudomallei) as the cause of diseases classified elsewhere: Secondary | ICD-10-CM | POA: Diagnosis not present

## 2020-10-12 DIAGNOSIS — N170 Acute kidney failure with tubular necrosis: Secondary | ICD-10-CM | POA: Diagnosis not present

## 2020-10-12 DIAGNOSIS — I129 Hypertensive chronic kidney disease with stage 1 through stage 4 chronic kidney disease, or unspecified chronic kidney disease: Secondary | ICD-10-CM | POA: Diagnosis not present

## 2020-10-12 DIAGNOSIS — E860 Dehydration: Secondary | ICD-10-CM | POA: Diagnosis not present

## 2020-10-19 DIAGNOSIS — I129 Hypertensive chronic kidney disease with stage 1 through stage 4 chronic kidney disease, or unspecified chronic kidney disease: Secondary | ICD-10-CM | POA: Diagnosis not present

## 2020-10-19 DIAGNOSIS — D631 Anemia in chronic kidney disease: Secondary | ICD-10-CM | POA: Diagnosis not present

## 2020-10-19 DIAGNOSIS — B965 Pseudomonas (aeruginosa) (mallei) (pseudomallei) as the cause of diseases classified elsewhere: Secondary | ICD-10-CM | POA: Diagnosis not present

## 2020-10-19 DIAGNOSIS — N1832 Chronic kidney disease, stage 3b: Secondary | ICD-10-CM | POA: Diagnosis not present

## 2020-10-19 DIAGNOSIS — E1122 Type 2 diabetes mellitus with diabetic chronic kidney disease: Secondary | ICD-10-CM | POA: Diagnosis not present

## 2020-10-19 DIAGNOSIS — G9341 Metabolic encephalopathy: Secondary | ICD-10-CM | POA: Diagnosis not present

## 2020-10-19 DIAGNOSIS — E860 Dehydration: Secondary | ICD-10-CM | POA: Diagnosis not present

## 2020-10-19 DIAGNOSIS — N390 Urinary tract infection, site not specified: Secondary | ICD-10-CM | POA: Diagnosis not present

## 2020-10-19 DIAGNOSIS — N170 Acute kidney failure with tubular necrosis: Secondary | ICD-10-CM | POA: Diagnosis not present

## 2020-10-20 DIAGNOSIS — N390 Urinary tract infection, site not specified: Secondary | ICD-10-CM | POA: Diagnosis not present

## 2020-10-20 DIAGNOSIS — E1122 Type 2 diabetes mellitus with diabetic chronic kidney disease: Secondary | ICD-10-CM | POA: Diagnosis not present

## 2020-10-20 DIAGNOSIS — E860 Dehydration: Secondary | ICD-10-CM | POA: Diagnosis not present

## 2020-10-20 DIAGNOSIS — N170 Acute kidney failure with tubular necrosis: Secondary | ICD-10-CM | POA: Diagnosis not present

## 2020-10-20 DIAGNOSIS — B965 Pseudomonas (aeruginosa) (mallei) (pseudomallei) as the cause of diseases classified elsewhere: Secondary | ICD-10-CM | POA: Diagnosis not present

## 2020-10-20 DIAGNOSIS — G9341 Metabolic encephalopathy: Secondary | ICD-10-CM | POA: Diagnosis not present

## 2020-10-20 DIAGNOSIS — D631 Anemia in chronic kidney disease: Secondary | ICD-10-CM | POA: Diagnosis not present

## 2020-10-20 DIAGNOSIS — I129 Hypertensive chronic kidney disease with stage 1 through stage 4 chronic kidney disease, or unspecified chronic kidney disease: Secondary | ICD-10-CM | POA: Diagnosis not present

## 2020-10-20 DIAGNOSIS — N1832 Chronic kidney disease, stage 3b: Secondary | ICD-10-CM | POA: Diagnosis not present

## 2020-10-25 DIAGNOSIS — D631 Anemia in chronic kidney disease: Secondary | ICD-10-CM | POA: Diagnosis not present

## 2020-10-25 DIAGNOSIS — B965 Pseudomonas (aeruginosa) (mallei) (pseudomallei) as the cause of diseases classified elsewhere: Secondary | ICD-10-CM | POA: Diagnosis not present

## 2020-10-25 DIAGNOSIS — G9341 Metabolic encephalopathy: Secondary | ICD-10-CM | POA: Diagnosis not present

## 2020-10-25 DIAGNOSIS — N1832 Chronic kidney disease, stage 3b: Secondary | ICD-10-CM | POA: Diagnosis not present

## 2020-10-25 DIAGNOSIS — N170 Acute kidney failure with tubular necrosis: Secondary | ICD-10-CM | POA: Diagnosis not present

## 2020-10-25 DIAGNOSIS — I129 Hypertensive chronic kidney disease with stage 1 through stage 4 chronic kidney disease, or unspecified chronic kidney disease: Secondary | ICD-10-CM | POA: Diagnosis not present

## 2020-10-25 DIAGNOSIS — E1122 Type 2 diabetes mellitus with diabetic chronic kidney disease: Secondary | ICD-10-CM | POA: Diagnosis not present

## 2020-10-25 DIAGNOSIS — N390 Urinary tract infection, site not specified: Secondary | ICD-10-CM | POA: Diagnosis not present

## 2020-10-25 DIAGNOSIS — E860 Dehydration: Secondary | ICD-10-CM | POA: Diagnosis not present

## 2020-10-26 DIAGNOSIS — B965 Pseudomonas (aeruginosa) (mallei) (pseudomallei) as the cause of diseases classified elsewhere: Secondary | ICD-10-CM | POA: Diagnosis not present

## 2020-10-26 DIAGNOSIS — E860 Dehydration: Secondary | ICD-10-CM | POA: Diagnosis not present

## 2020-10-26 DIAGNOSIS — G9341 Metabolic encephalopathy: Secondary | ICD-10-CM | POA: Diagnosis not present

## 2020-10-26 DIAGNOSIS — D631 Anemia in chronic kidney disease: Secondary | ICD-10-CM | POA: Diagnosis not present

## 2020-10-26 DIAGNOSIS — N1832 Chronic kidney disease, stage 3b: Secondary | ICD-10-CM | POA: Diagnosis not present

## 2020-10-26 DIAGNOSIS — N170 Acute kidney failure with tubular necrosis: Secondary | ICD-10-CM | POA: Diagnosis not present

## 2020-10-26 DIAGNOSIS — I129 Hypertensive chronic kidney disease with stage 1 through stage 4 chronic kidney disease, or unspecified chronic kidney disease: Secondary | ICD-10-CM | POA: Diagnosis not present

## 2020-10-26 DIAGNOSIS — N390 Urinary tract infection, site not specified: Secondary | ICD-10-CM | POA: Diagnosis not present

## 2020-10-26 DIAGNOSIS — E1122 Type 2 diabetes mellitus with diabetic chronic kidney disease: Secondary | ICD-10-CM | POA: Diagnosis not present

## 2020-10-27 DIAGNOSIS — D631 Anemia in chronic kidney disease: Secondary | ICD-10-CM | POA: Diagnosis not present

## 2020-10-27 DIAGNOSIS — N390 Urinary tract infection, site not specified: Secondary | ICD-10-CM | POA: Diagnosis not present

## 2020-10-27 DIAGNOSIS — G9341 Metabolic encephalopathy: Secondary | ICD-10-CM | POA: Diagnosis not present

## 2020-10-27 DIAGNOSIS — E1122 Type 2 diabetes mellitus with diabetic chronic kidney disease: Secondary | ICD-10-CM | POA: Diagnosis not present

## 2020-10-27 DIAGNOSIS — B965 Pseudomonas (aeruginosa) (mallei) (pseudomallei) as the cause of diseases classified elsewhere: Secondary | ICD-10-CM | POA: Diagnosis not present

## 2020-10-27 DIAGNOSIS — N1832 Chronic kidney disease, stage 3b: Secondary | ICD-10-CM | POA: Diagnosis not present

## 2020-10-27 DIAGNOSIS — N170 Acute kidney failure with tubular necrosis: Secondary | ICD-10-CM | POA: Diagnosis not present

## 2020-10-27 DIAGNOSIS — I129 Hypertensive chronic kidney disease with stage 1 through stage 4 chronic kidney disease, or unspecified chronic kidney disease: Secondary | ICD-10-CM | POA: Diagnosis not present

## 2020-10-27 DIAGNOSIS — E860 Dehydration: Secondary | ICD-10-CM | POA: Diagnosis not present

## 2020-11-02 DIAGNOSIS — E860 Dehydration: Secondary | ICD-10-CM | POA: Diagnosis not present

## 2020-11-02 DIAGNOSIS — N1832 Chronic kidney disease, stage 3b: Secondary | ICD-10-CM | POA: Diagnosis not present

## 2020-11-02 DIAGNOSIS — D631 Anemia in chronic kidney disease: Secondary | ICD-10-CM | POA: Diagnosis not present

## 2020-11-02 DIAGNOSIS — N170 Acute kidney failure with tubular necrosis: Secondary | ICD-10-CM | POA: Diagnosis not present

## 2020-11-02 DIAGNOSIS — I129 Hypertensive chronic kidney disease with stage 1 through stage 4 chronic kidney disease, or unspecified chronic kidney disease: Secondary | ICD-10-CM | POA: Diagnosis not present

## 2020-11-02 DIAGNOSIS — G9341 Metabolic encephalopathy: Secondary | ICD-10-CM | POA: Diagnosis not present

## 2020-11-02 DIAGNOSIS — E1122 Type 2 diabetes mellitus with diabetic chronic kidney disease: Secondary | ICD-10-CM | POA: Diagnosis not present

## 2020-11-02 DIAGNOSIS — N390 Urinary tract infection, site not specified: Secondary | ICD-10-CM | POA: Diagnosis not present

## 2020-11-02 DIAGNOSIS — B965 Pseudomonas (aeruginosa) (mallei) (pseudomallei) as the cause of diseases classified elsewhere: Secondary | ICD-10-CM | POA: Diagnosis not present

## 2020-11-07 ENCOUNTER — Other Ambulatory Visit: Payer: Self-pay

## 2020-11-07 ENCOUNTER — Ambulatory Visit (INDEPENDENT_AMBULATORY_CARE_PROVIDER_SITE_OTHER): Payer: Medicare PPO | Admitting: Legal Medicine

## 2020-11-07 ENCOUNTER — Encounter: Payer: Self-pay | Admitting: Legal Medicine

## 2020-11-07 VITALS — BP 90/54 | HR 70 | Temp 97.6°F | Resp 16 | Ht 64.0 in | Wt 105.0 lb

## 2020-11-07 DIAGNOSIS — E1142 Type 2 diabetes mellitus with diabetic polyneuropathy: Secondary | ICD-10-CM

## 2020-11-07 DIAGNOSIS — E1121 Type 2 diabetes mellitus with diabetic nephropathy: Secondary | ICD-10-CM

## 2020-11-07 DIAGNOSIS — Z1231 Encounter for screening mammogram for malignant neoplasm of breast: Secondary | ICD-10-CM | POA: Diagnosis not present

## 2020-11-07 LAB — COMPREHENSIVE METABOLIC PANEL
ALT: 22 IU/L (ref 0–32)
AST: 19 IU/L (ref 0–40)
Albumin/Globulin Ratio: 1.9 (ref 1.2–2.2)
Albumin: 4.1 g/dL (ref 3.8–4.8)
Alkaline Phosphatase: 69 IU/L (ref 44–121)
BUN/Creatinine Ratio: 14 (ref 12–28)
BUN: 23 mg/dL (ref 8–27)
Bilirubin Total: 0.3 mg/dL (ref 0.0–1.2)
CO2: 22 mmol/L (ref 20–29)
Calcium: 9.2 mg/dL (ref 8.7–10.3)
Chloride: 98 mmol/L (ref 96–106)
Creatinine, Ser: 1.61 mg/dL — ABNORMAL HIGH (ref 0.57–1.00)
Globulin, Total: 2.2 g/dL (ref 1.5–4.5)
Glucose: 255 mg/dL — ABNORMAL HIGH (ref 65–99)
Potassium: 4.9 mmol/L (ref 3.5–5.2)
Sodium: 135 mmol/L (ref 134–144)
Total Protein: 6.3 g/dL (ref 6.0–8.5)
eGFR: 35 mL/min/{1.73_m2} — ABNORMAL LOW (ref >59)

## 2020-11-07 NOTE — Progress Notes (Signed)
Established Patient Office Visit  Subjective:  Patient ID: Donna Moran, female    DOB: 1955/06/12  Age: 65 y.o. MRN: [id number]  CC:  Chief Complaint  Patient presents with   Weight Check   Diabetes    HPI Donna Moran presents for follow up DM. Dm SHE is on trulicity 3.35 and doing well. She has not gotten her frestyle librae.  She needs education.  No low BS She needs pneumovax.  Past Medical History:  Diagnosis Date   Diabetes mellitus without complication ([place])    History of blood transfusion    Hyperlipidemia     Past Surgical History:  Procedure Laterality Date   CATARACT EXTRACTION Bilateral 04/2013-05/2013    Family History  Problem Relation Age of Onset   Cancer Neg Hx    Diabetes Neg Hx    Stroke Neg Hx     Social History   Socioeconomic History   Marital status: Married    Spouse name: Not on file   Number of children: 2   Years of education: Not on file   Highest education level: Not on file  Occupational History   Occupation: Unemployed    Employer: NOT EMPLOYED  Tobacco Use   Smoking status: Former    Types: Cigarettes    Quit date: 2012    Years since quitting: 10.5   Smokeless tobacco: Never  Substance and Sexual Activity   Alcohol use: No   Drug use: No   Sexual activity: Yes  Other Topics Concern   Not on file  Social History Narrative   Regular exercise-no   Caffeine Use-yes   Social Determinants of Health   Financial Resource Strain: Not on file  Food Insecurity: Not on file  Transportation Needs: Not on file  Physical Activity: Not on file  Stress: Not on file  Social Connections: Not on file  Intimate Partner Violence: Not on file    Outpatient Medications Prior to Visit  Medication Sig Dispense Refill   atorvastatin (LIPITOR) 40 MG tablet Take 40 mg by mouth at bedtime.     B-D UF III MINI PEN NEEDLES 31G X 5 MM MISC Inject 1 each into the skin 3 (three) times daily. 30 each 2   Blood Glucose Monitoring  Suppl (ACCU-CHEK GUIDE) w/Device KIT USE TO CHECK GLUCOSE IN THE MORNING AND WITH MEALS     citalopram (CELEXA) 20 MG tablet Take 1 tablet (20 mg total) by mouth daily. 90 tablet 2   Continuous Blood Gluc Sensor (FREESTYLE LIBRE 14 DAY SENSOR) MISC 1 each by Does not apply route in the morning, at noon, and at bedtime. 6 each 3   donepezil (ARICEPT) 10 MG tablet Take 1 tablet (10 mg total) by mouth at bedtime. 90 tablet 2   Dulaglutide (TRULICITY) 8.25 PG/9.8MK SOPN Inject 0.75 mg into the skin once a week. 4 mL 2   glucose blood (FREESTYLE LITE) test strip 1 each by Other route daily before breakfast. Use as instructed 300 strip 2   lisinopril (ZESTRIL) 2.5 MG tablet Take 1 tablet (2.5 mg total) by mouth daily. 90 tablet 2   megestrol (MEGACE) 20 MG tablet Take 1 tablet (20 mg total) by mouth daily. 30 tablet 3   metFORMIN (GLUCOPHAGE) 1000 MG tablet TAKE 1 TABLET BY MOUTH TWICE DAILY 180 tablet 2   metoCLOPramide (REGLAN) 5 MG tablet Take 1 tablet (5 mg total) by mouth 4 (four) times daily. 120 tablet 2   omeprazole (PRILOSEC) 40 MG capsule  Take 1 capsule (40 mg total) by mouth 2 (two) times daily. 180 capsule 2   ondansetron (ZOFRAN) 4 MG tablet Take 1 tablet (4 mg total) by mouth every 8 (eight) hours as needed for nausea or vomiting. 20 tablet 0   rosuvastatin (CRESTOR) 10 MG tablet Take 10 mg by mouth at bedtime.     No facility-administered medications prior to visit.    Allergies  Allergen Reactions   Aspirin Nausea Only    Can take coated aspirin    ROS Review of Systems  Constitutional:  Negative for activity change and appetite change.  HENT:  Negative for congestion.   Eyes:  Negative for visual disturbance.  Respiratory:  Negative for chest tightness and shortness of breath.   Cardiovascular:  Negative for chest pain, palpitations and leg swelling.  Gastrointestinal:  Negative for abdominal distention and abdominal pain.  Endocrine: Positive for polyuria.  Genitourinary:   Positive for dysuria. Negative for difficulty urinating.  Musculoskeletal:  Negative for arthralgias and back pain.  Neurological: Negative.   Psychiatric/Behavioral: Negative.       Objective:    Physical Exam Vitals reviewed.  Constitutional:      Appearance: Normal appearance.  HENT:     Head: Normocephalic.     Right Ear: Tympanic membrane, ear canal and external ear normal.     Left Ear: Tympanic membrane, ear canal and external ear normal.     Mouth/Throat:     Mouth: Mucous membranes are moist.  Eyes:     Extraocular Movements: Extraocular movements intact.     Conjunctiva/sclera: Conjunctivae normal.     Pupils: Pupils are equal, round, and reactive to light.  Cardiovascular:     Rate and Rhythm: Normal rate and regular rhythm.     Pulses: Normal pulses.     Heart sounds: Normal heart sounds. No murmur heard.   No gallop.  Pulmonary:     Effort: Pulmonary effort is normal. No respiratory distress.     Breath sounds: Normal breath sounds. No wheezing.  Abdominal:     General: Abdomen is flat. Bowel sounds are normal. There is no distension.     Tenderness: There is no abdominal tenderness.  Musculoskeletal:        General: Normal range of motion.     Cervical back: Normal range of motion.  Skin:    General: Skin is warm.     Capillary Refill: Capillary refill takes less than 2 seconds.  Neurological:     General: No focal deficit present.     Mental Status: She is alert and oriented to person, place, and time. Mental status is at baseline.    BP (!) 90/54   Pulse 70   Temp 97.6 F (36.4 C)   Resp 16   Ht '5\' 4"'  (1.626 m)   Wt 105 lb (47.6 kg)   BMI 18.02 kg/m  Wt Readings from Last 3 Encounters:  11/07/20 105 lb (47.6 kg)  10/10/20 102 lb (46.3 kg)  09/27/20 104 lb (47.2 kg)     Health Maintenance Due  Topic Date Due   Hepatitis C Screening  Never done   Zoster Vaccines- Shingrix (1 of 2) Never done   MAMMOGRAM  03/25/2016   PNA vac Low Risk  Adult (2 of 2 - PPSV23) 01/02/2019   COVID-19 Vaccine (3 - Booster for Pfizer series) 02/05/2020   OPHTHALMOLOGY EXAM  09/28/2020    There are no preventive care reminders to display for this patient.  Lab Results  Component Value Date   TSH 0.67 10/07/2012   Lab Results  Component Value Date   WBC 6.8 09/14/2020   HGB 10.2 (L) 09/14/2020   HCT 31.5 (L) 09/14/2020   MCV 92 09/14/2020   PLT 260 09/14/2020   Lab Results  Component Value Date   NA 143 09/14/2020   K 4.6 09/14/2020   CO2 22 09/14/2020   GLUCOSE 86 09/14/2020   BUN 34 (H) 09/14/2020   CREATININE 1.53 (H) 09/14/2020   BILITOT <0.2 09/14/2020   ALKPHOS 74 09/14/2020   AST 19 09/14/2020   ALT 18 09/14/2020   PROT 6.0 09/14/2020   ALBUMIN 4.2 09/14/2020   CALCIUM 9.1 09/14/2020   EGFR 37 (L) 09/14/2020   GFR 71.18 01/26/2015   Lab Results  Component Value Date   CHOL 173 09/14/2020   Lab Results  Component Value Date   HDL 70 09/14/2020   Lab Results  Component Value Date   LDLCALC 80 09/14/2020   Lab Results  Component Value Date   TRIG 132 09/14/2020   Lab Results  Component Value Date   CHOLHDL 2.5 09/14/2020   Lab Results  Component Value Date   HGBA1C 8.9 (H) 09/14/2020      Assessment & Plan:   Diagnoses and all orders for this visit: Diabetic polyneuropathy associated with type 2 diabetes mellitus ([place]) An individual care plan for diabetes was established and reinforced today.  The patient's status was assessed using clinical findings on exam, labs and diagnostic testing. Patient success at meeting goals based on disease specific evidence-based guidelines and found to be good controlled. Medications were assessed and patient's understanding of the medical issues , including barriers were assessed. Recommend adherence to a diabetic diet, a graduated exercise program, HgbA1c level is checked quarterly, and urine microalbumin performed yearly .  Annual mono-filament sensation testing  performed. Lower blood pressure and control hyperlipidemia is important. Get annual eye exams and annual flu shots and smoking cessation discussed.  Self management goals were discussed.   Diabetic glomerulopathy ([facility]) -     Comprehensive metabolic panel An individual care plan for diabetes was established and reinforced today.  The patient's status was assessed using clinical findings on exam, labs and diagnostic testing. Patient success at meeting goals based on disease specific evidence-based guidelines and found to be good controlled. Medications were assessed and patient's understanding of the medical issues , including barriers were assessed. Recommend adherence to a diabetic diet, a graduated exercise program, HgbA1c level is checked quarterly, and urine microalbumin performed yearly .  Annual mono-filament sensation testing performed. Lower blood pressure and control hyperlipidemia is important. Get annual eye exams and annual flu shots and smoking cessation discussed.  Self management goals were discussed.  Encounter for screening mammogram for malignant neoplasm of breast -     MM Digital Screening     Follow-up: Return in about 2 months (around 01/08/2021) for fasting.    Reinaldo Meeker, MD

## 2020-11-08 NOTE — Progress Notes (Signed)
Glucose 255, kidney tests stable , liver tests normal, potassium normal lp

## 2020-11-15 DIAGNOSIS — M85822 Other specified disorders of bone density and structure, left upper arm: Secondary | ICD-10-CM | POA: Diagnosis not present

## 2020-11-15 DIAGNOSIS — S42222A 2-part displaced fracture of surgical neck of left humerus, initial encounter for closed fracture: Secondary | ICD-10-CM | POA: Diagnosis not present

## 2020-11-15 DIAGNOSIS — R410 Disorientation, unspecified: Secondary | ICD-10-CM | POA: Diagnosis not present

## 2020-11-15 DIAGNOSIS — S42202A Unspecified fracture of upper end of left humerus, initial encounter for closed fracture: Secondary | ICD-10-CM | POA: Diagnosis not present

## 2020-11-15 DIAGNOSIS — S42292A Other displaced fracture of upper end of left humerus, initial encounter for closed fracture: Secondary | ICD-10-CM | POA: Diagnosis not present

## 2020-11-15 DIAGNOSIS — R634 Abnormal weight loss: Secondary | ICD-10-CM | POA: Diagnosis not present

## 2020-11-15 DIAGNOSIS — M25519 Pain in unspecified shoulder: Secondary | ICD-10-CM | POA: Diagnosis not present

## 2020-11-15 DIAGNOSIS — Z7401 Bed confinement status: Secondary | ICD-10-CM | POA: Diagnosis not present

## 2020-11-15 DIAGNOSIS — E43 Unspecified severe protein-calorie malnutrition: Secondary | ICD-10-CM | POA: Diagnosis not present

## 2020-11-15 DIAGNOSIS — K838 Other specified diseases of biliary tract: Secondary | ICD-10-CM | POA: Diagnosis not present

## 2020-11-15 DIAGNOSIS — R531 Weakness: Secondary | ICD-10-CM | POA: Diagnosis not present

## 2020-11-15 DIAGNOSIS — K3189 Other diseases of stomach and duodenum: Secondary | ICD-10-CM | POA: Diagnosis not present

## 2020-11-15 DIAGNOSIS — D6489 Other specified anemias: Secondary | ICD-10-CM | POA: Diagnosis not present

## 2020-11-15 DIAGNOSIS — R262 Difficulty in walking, not elsewhere classified: Secondary | ICD-10-CM | POA: Diagnosis not present

## 2020-11-15 DIAGNOSIS — Z681 Body mass index (BMI) 19 or less, adult: Secondary | ICD-10-CM | POA: Diagnosis not present

## 2020-11-15 DIAGNOSIS — W06XXXA Fall from bed, initial encounter: Secondary | ICD-10-CM | POA: Diagnosis not present

## 2020-11-15 DIAGNOSIS — I6529 Occlusion and stenosis of unspecified carotid artery: Secondary | ICD-10-CM | POA: Diagnosis not present

## 2020-11-15 DIAGNOSIS — R279 Unspecified lack of coordination: Secondary | ICD-10-CM | POA: Diagnosis not present

## 2020-11-15 DIAGNOSIS — I700 Atherosclerosis of aorta: Secondary | ICD-10-CM | POA: Diagnosis not present

## 2020-11-15 DIAGNOSIS — G9389 Other specified disorders of brain: Secondary | ICD-10-CM | POA: Diagnosis not present

## 2020-11-15 DIAGNOSIS — S42291A Other displaced fracture of upper end of right humerus, initial encounter for closed fracture: Secondary | ICD-10-CM | POA: Diagnosis not present

## 2020-11-15 DIAGNOSIS — R739 Hyperglycemia, unspecified: Secondary | ICD-10-CM | POA: Diagnosis not present

## 2020-11-15 DIAGNOSIS — R296 Repeated falls: Secondary | ICD-10-CM | POA: Diagnosis not present

## 2020-11-15 DIAGNOSIS — J439 Emphysema, unspecified: Secondary | ICD-10-CM | POA: Diagnosis not present

## 2020-11-15 DIAGNOSIS — Z742 Need for assistance at home and no other household member able to render care: Secondary | ICD-10-CM | POA: Diagnosis not present

## 2020-11-15 DIAGNOSIS — I10 Essential (primary) hypertension: Secondary | ICD-10-CM | POA: Diagnosis not present

## 2020-11-15 DIAGNOSIS — W19XXXA Unspecified fall, initial encounter: Secondary | ICD-10-CM | POA: Diagnosis not present

## 2020-11-15 DIAGNOSIS — E119 Type 2 diabetes mellitus without complications: Secondary | ICD-10-CM | POA: Diagnosis not present

## 2020-11-15 DIAGNOSIS — M810 Age-related osteoporosis without current pathological fracture: Secondary | ICD-10-CM | POA: Diagnosis not present

## 2020-11-15 DIAGNOSIS — R2681 Unsteadiness on feet: Secondary | ICD-10-CM | POA: Diagnosis not present

## 2020-11-15 DIAGNOSIS — R7989 Other specified abnormal findings of blood chemistry: Secondary | ICD-10-CM | POA: Diagnosis not present

## 2020-11-15 DIAGNOSIS — R911 Solitary pulmonary nodule: Secondary | ICD-10-CM | POA: Diagnosis not present

## 2020-11-15 DIAGNOSIS — S42352D Displaced comminuted fracture of shaft of humerus, left arm, subsequent encounter for fracture with routine healing: Secondary | ICD-10-CM | POA: Diagnosis not present

## 2020-11-15 DIAGNOSIS — M19012 Primary osteoarthritis, left shoulder: Secondary | ICD-10-CM | POA: Diagnosis not present

## 2020-11-15 DIAGNOSIS — G8918 Other acute postprocedural pain: Secondary | ICD-10-CM | POA: Diagnosis not present

## 2020-11-15 DIAGNOSIS — I313 Pericardial effusion (noninflammatory): Secondary | ICD-10-CM | POA: Diagnosis not present

## 2020-11-15 DIAGNOSIS — S42352A Displaced comminuted fracture of shaft of humerus, left arm, initial encounter for closed fracture: Secondary | ICD-10-CM | POA: Diagnosis not present

## 2020-11-18 DIAGNOSIS — I10 Essential (primary) hypertension: Secondary | ICD-10-CM | POA: Diagnosis not present

## 2020-11-18 DIAGNOSIS — G8918 Other acute postprocedural pain: Secondary | ICD-10-CM | POA: Diagnosis not present

## 2020-11-18 DIAGNOSIS — S42222A 2-part displaced fracture of surgical neck of left humerus, initial encounter for closed fracture: Secondary | ICD-10-CM | POA: Diagnosis not present

## 2020-11-18 DIAGNOSIS — E119 Type 2 diabetes mellitus without complications: Secondary | ICD-10-CM | POA: Diagnosis not present

## 2020-11-24 DIAGNOSIS — D62 Acute posthemorrhagic anemia: Secondary | ICD-10-CM | POA: Diagnosis not present

## 2020-11-24 DIAGNOSIS — F4322 Adjustment disorder with anxiety: Secondary | ICD-10-CM | POA: Diagnosis not present

## 2020-11-24 DIAGNOSIS — G8918 Other acute postprocedural pain: Secondary | ICD-10-CM | POA: Diagnosis not present

## 2020-11-24 DIAGNOSIS — S42202A Unspecified fracture of upper end of left humerus, initial encounter for closed fracture: Secondary | ICD-10-CM | POA: Diagnosis not present

## 2020-11-24 DIAGNOSIS — R531 Weakness: Secondary | ICD-10-CM | POA: Diagnosis not present

## 2020-11-24 DIAGNOSIS — F0390 Unspecified dementia without behavioral disturbance: Secondary | ICD-10-CM | POA: Diagnosis not present

## 2020-11-24 DIAGNOSIS — R262 Difficulty in walking, not elsewhere classified: Secondary | ICD-10-CM | POA: Diagnosis not present

## 2020-11-24 DIAGNOSIS — S42352D Displaced comminuted fracture of shaft of humerus, left arm, subsequent encounter for fracture with routine healing: Secondary | ICD-10-CM | POA: Diagnosis not present

## 2020-11-24 DIAGNOSIS — S42211D Unspecified displaced fracture of surgical neck of right humerus, subsequent encounter for fracture with routine healing: Secondary | ICD-10-CM | POA: Diagnosis not present

## 2020-11-24 DIAGNOSIS — F32A Depression, unspecified: Secondary | ICD-10-CM | POA: Diagnosis not present

## 2020-11-24 DIAGNOSIS — R279 Unspecified lack of coordination: Secondary | ICD-10-CM | POA: Diagnosis not present

## 2020-11-24 DIAGNOSIS — Z7401 Bed confinement status: Secondary | ICD-10-CM | POA: Diagnosis not present

## 2020-11-26 DIAGNOSIS — G8918 Other acute postprocedural pain: Secondary | ICD-10-CM | POA: Diagnosis not present

## 2020-11-26 DIAGNOSIS — D62 Acute posthemorrhagic anemia: Secondary | ICD-10-CM | POA: Diagnosis not present

## 2020-11-26 DIAGNOSIS — R262 Difficulty in walking, not elsewhere classified: Secondary | ICD-10-CM | POA: Diagnosis not present

## 2020-11-26 DIAGNOSIS — S42211D Unspecified displaced fracture of surgical neck of right humerus, subsequent encounter for fracture with routine healing: Secondary | ICD-10-CM | POA: Diagnosis not present

## 2020-11-30 DIAGNOSIS — S42202A Unspecified fracture of upper end of left humerus, initial encounter for closed fracture: Secondary | ICD-10-CM | POA: Diagnosis not present

## 2020-12-12 DIAGNOSIS — F0390 Unspecified dementia without behavioral disturbance: Secondary | ICD-10-CM | POA: Diagnosis not present

## 2020-12-12 DIAGNOSIS — D631 Anemia in chronic kidney disease: Secondary | ICD-10-CM | POA: Diagnosis not present

## 2020-12-12 DIAGNOSIS — E1122 Type 2 diabetes mellitus with diabetic chronic kidney disease: Secondary | ICD-10-CM | POA: Diagnosis not present

## 2020-12-12 DIAGNOSIS — E46 Unspecified protein-calorie malnutrition: Secondary | ICD-10-CM | POA: Diagnosis not present

## 2020-12-12 DIAGNOSIS — M810 Age-related osteoporosis without current pathological fracture: Secondary | ICD-10-CM | POA: Diagnosis not present

## 2020-12-12 DIAGNOSIS — I129 Hypertensive chronic kidney disease with stage 1 through stage 4 chronic kidney disease, or unspecified chronic kidney disease: Secondary | ICD-10-CM | POA: Diagnosis not present

## 2020-12-12 DIAGNOSIS — E1165 Type 2 diabetes mellitus with hyperglycemia: Secondary | ICD-10-CM | POA: Diagnosis not present

## 2020-12-12 DIAGNOSIS — M80022D Age-related osteoporosis with current pathological fracture, left humerus, subsequent encounter for fracture with routine healing: Secondary | ICD-10-CM | POA: Diagnosis not present

## 2020-12-12 DIAGNOSIS — N183 Chronic kidney disease, stage 3 unspecified: Secondary | ICD-10-CM | POA: Diagnosis not present

## 2020-12-14 DIAGNOSIS — N183 Chronic kidney disease, stage 3 unspecified: Secondary | ICD-10-CM | POA: Diagnosis not present

## 2020-12-14 DIAGNOSIS — E46 Unspecified protein-calorie malnutrition: Secondary | ICD-10-CM | POA: Diagnosis not present

## 2020-12-14 DIAGNOSIS — M80022D Age-related osteoporosis with current pathological fracture, left humerus, subsequent encounter for fracture with routine healing: Secondary | ICD-10-CM | POA: Diagnosis not present

## 2020-12-14 DIAGNOSIS — I129 Hypertensive chronic kidney disease with stage 1 through stage 4 chronic kidney disease, or unspecified chronic kidney disease: Secondary | ICD-10-CM | POA: Diagnosis not present

## 2020-12-14 DIAGNOSIS — D631 Anemia in chronic kidney disease: Secondary | ICD-10-CM | POA: Diagnosis not present

## 2020-12-14 DIAGNOSIS — E1122 Type 2 diabetes mellitus with diabetic chronic kidney disease: Secondary | ICD-10-CM | POA: Diagnosis not present

## 2020-12-14 DIAGNOSIS — M810 Age-related osteoporosis without current pathological fracture: Secondary | ICD-10-CM | POA: Diagnosis not present

## 2020-12-14 DIAGNOSIS — F0390 Unspecified dementia without behavioral disturbance: Secondary | ICD-10-CM | POA: Diagnosis not present

## 2020-12-14 DIAGNOSIS — E1165 Type 2 diabetes mellitus with hyperglycemia: Secondary | ICD-10-CM | POA: Diagnosis not present

## 2020-12-15 ENCOUNTER — Ambulatory Visit (INDEPENDENT_AMBULATORY_CARE_PROVIDER_SITE_OTHER): Payer: Medicare Other | Admitting: Legal Medicine

## 2020-12-15 ENCOUNTER — Other Ambulatory Visit: Payer: Self-pay

## 2020-12-15 ENCOUNTER — Encounter: Payer: Self-pay | Admitting: Legal Medicine

## 2020-12-15 VITALS — BP 102/60 | HR 105 | Temp 98.7°F | Resp 16 | Ht 64.0 in | Wt 105.0 lb

## 2020-12-15 DIAGNOSIS — G301 Alzheimer's disease with late onset: Secondary | ICD-10-CM

## 2020-12-15 DIAGNOSIS — S42292A Other displaced fracture of upper end of left humerus, initial encounter for closed fracture: Secondary | ICD-10-CM

## 2020-12-15 DIAGNOSIS — F0281 Dementia in other diseases classified elsewhere with behavioral disturbance: Secondary | ICD-10-CM

## 2020-12-15 DIAGNOSIS — E440 Moderate protein-calorie malnutrition: Secondary | ICD-10-CM

## 2020-12-15 DIAGNOSIS — F02818 Dementia in other diseases classified elsewhere, unspecified severity, with other behavioral disturbance: Secondary | ICD-10-CM | POA: Insufficient documentation

## 2020-12-15 MED ORDER — MEGESTROL ACETATE 20 MG PO TABS: 20.0000 mg | ORAL_TABLET | Freq: Every day | ORAL | 3 refills | Status: DC

## 2020-12-15 NOTE — Progress Notes (Signed)
Acute Office Visit  Subjective:    Patient ID: Donna Moran, female    DOB: 1956/02/16, 65 y.o.   MRN: [id number]  Chief Complaint  Patient presents with   Transitions Of Care    HPI Patient is in today for transition of care and reconciliation of medicines.  She was admitted 11/15/2020 for fall and fracture of left humerus.  She was treated for this and discharged on 11/22/2020.  She was confused in hospital and now is unable to care for herself.  She was sent to [facility] for rehabilitation but did poorly memory wise and refused therapy. She was discharged on 12/08/2020. Medicines are reconciled with stop of metformin, megace, omeprazole, citalopram.   He present status is she incontinent of urine and feces.   She needs assistance dressing, assistance washing and bathing.  Can get to table, she is unable to prepare food or perform IADLs or medicines.  She is in left arm sling.  Past Medical History:  Diagnosis Date   Diabetes mellitus without complication ([place])    History of blood transfusion    Hyperlipidemia     Past Surgical History:  Procedure Laterality Date   CATARACT EXTRACTION Bilateral 04/2013-05/2013    Family History  Problem Relation Age of Onset   Cancer Neg Hx    Diabetes Neg Hx    Stroke Neg Hx     Social History   Socioeconomic History   Marital status: Married    Spouse name: Not on file   Number of children: 2   Years of education: Not on file   Highest education level: Not on file  Occupational History   Occupation: Unemployed    Employer: NOT EMPLOYED  Tobacco Use   Smoking status: Former    Types: Cigarettes    Quit date: 2012    Years since quitting: 10.6   Smokeless tobacco: Never  Substance and Sexual Activity   Alcohol use: No   Drug use: No   Sexual activity: Yes  Other Topics Concern   Not on file  Social History Narrative   Regular exercise-no   Caffeine Use-yes   Social Determinants of Health   Financial Resource Strain: Not  on file  Food Insecurity: Not on file  Transportation Needs: Not on file  Physical Activity: Not on file  Stress: Not on file  Social Connections: Not on file  Intimate Partner Violence: Not on file    Outpatient Medications Prior to Visit  Medication Sig Dispense Refill   B-D UF III MINI PEN NEEDLES 31G X 5 MM MISC Inject 1 each into the skin 3 (three) times daily. 30 each 2   Blood Glucose Monitoring Suppl (ACCU-CHEK GUIDE) w/Device KIT USE TO CHECK GLUCOSE IN THE MORNING AND WITH MEALS     Continuous Blood Gluc Sensor (FREESTYLE LIBRE 14 DAY SENSOR) MISC 1 each by Does not apply route in the morning, at noon, and at bedtime. 6 each 3   donepezil (ARICEPT) 10 MG tablet Take 1 tablet (10 mg total) by mouth at bedtime. 90 tablet 2   Dulaglutide (TRULICITY) 9.44 HQ/7.5FF SOPN Inject 0.75 mg into the skin once a week. 4 mL 2   glucose blood (FREESTYLE LITE) test strip 1 each by Other route daily before breakfast. Use as instructed 300 strip 2   lisinopril (ZESTRIL) 2.5 MG tablet Take 1 tablet (2.5 mg total) by mouth daily. 90 tablet 2   metoCLOPramide (REGLAN) 5 MG tablet Take 1 tablet (5 mg total)  by mouth 4 (four) times daily. 120 tablet 2   ondansetron (ZOFRAN) 4 MG tablet Take 1 tablet (4 mg total) by mouth every 8 (eight) hours as needed for nausea or vomiting. 20 tablet 0   oxyCODONE (OXY IR/ROXICODONE) 5 MG immediate release tablet Take by mouth.     rosuvastatin (CRESTOR) 10 MG tablet Take 10 mg by mouth at bedtime.     atorvastatin (LIPITOR) 40 MG tablet Take 40 mg by mouth at bedtime.     citalopram (CELEXA) 20 MG tablet Take 1 tablet (20 mg total) by mouth daily. 90 tablet 2   megestrol (MEGACE) 20 MG tablet Take 1 tablet (20 mg total) by mouth daily. 30 tablet 3   metFORMIN (GLUCOPHAGE) 1000 MG tablet TAKE 1 TABLET BY MOUTH TWICE DAILY 180 tablet 2   omeprazole (PRILOSEC) 40 MG capsule Take 1 capsule (40 mg total) by mouth 2 (two) times daily. 180 capsule 2   No  facility-administered medications prior to visit.    Allergies  Allergen Reactions   Aspirin Nausea Only    Can take coated aspirin    Review of Systems  Constitutional:  Positive for activity change. Negative for appetite change, chills, diaphoresis and fatigue.  HENT:  Negative for congestion.   Eyes:  Negative for visual disturbance.  Respiratory:  Negative for chest tightness, shortness of breath and wheezing.   Gastrointestinal:  Negative for abdominal distention and abdominal pain.  Genitourinary:        Incontinence  Musculoskeletal:  Positive for arthralgias (left shoulder).  Skin: Negative.   Neurological: Negative.   Psychiatric/Behavioral:  Positive for behavioral problems and confusion.       Objective:    Physical Exam Vitals reviewed.  Constitutional:      General: She is not in acute distress.    Appearance: Normal appearance.  HENT:     Head: Normocephalic.     Right Ear: Tympanic membrane, ear canal and external ear normal.     Left Ear: Tympanic membrane, ear canal and external ear normal.     Mouth/Throat:     Mouth: Mucous membranes are moist.     Pharynx: Oropharynx is clear.  Eyes:     Extraocular Movements: Extraocular movements intact.     Conjunctiva/sclera: Conjunctivae normal.     Pupils: Pupils are equal, round, and reactive to light.  Cardiovascular:     Rate and Rhythm: Normal rate and regular rhythm.     Pulses: Normal pulses.     Heart sounds: Normal heart sounds. No murmur heard.   No gallop.  Pulmonary:     Effort: Pulmonary effort is normal. No respiratory distress.     Breath sounds: Normal breath sounds. No wheezing.  Abdominal:     General: Abdomen is flat. Bowel sounds are normal. There is no distension.     Palpations: Abdomen is soft.     Tenderness: There is no abdominal tenderness.  Musculoskeletal:     Cervical back: Normal range of motion.     Right lower leg: No edema.     Left lower leg: No edema.     Comments:  Fracture left shoulder  Skin:    General: Skin is warm.     Capillary Refill: Capillary refill takes less than 2 seconds.  Neurological:     General: No focal deficit present.     Mental Status: She is alert and oriented to person, place, and time. Mental status is at baseline.  Motor: Weakness present.     Deep Tendon Reflexes: Reflexes normal.  Psychiatric:        Mood and Affect: Mood normal.    BP 102/60   Pulse (!) 105   Temp 98.7 F (37.1 C)   Resp 16   Ht $R'5\' 4"'aJ$  (1.626 m)   Wt 105 lb (47.6 kg)   SpO2 98%   BMI 18.02 kg/m  Wt Readings from Last 3 Encounters:  12/15/20 105 lb (47.6 kg)  11/07/20 105 lb (47.6 kg)  10/10/20 102 lb (46.3 kg)    Health Maintenance Due  Topic Date Due   Hepatitis C Screening  Never done   Zoster Vaccines- Shingrix (1 of 2) Never done   MAMMOGRAM  03/25/2016   PNA vac Low Risk Adult (2 of 2 - PPSV23) 01/02/2019   COVID-19 Vaccine (3 - Booster for Pfizer series) 02/05/2020   OPHTHALMOLOGY EXAM  09/28/2020   INFLUENZA VACCINE  11/14/2020    There are no preventive care reminders to display for this patient.   Lab Results  Component Value Date   TSH 0.67 10/07/2012   Lab Results  Component Value Date   WBC 6.8 09/14/2020   HGB 10.2 (L) 09/14/2020   HCT 31.5 (L) 09/14/2020   MCV 92 09/14/2020   PLT 260 09/14/2020   Lab Results  Component Value Date   NA 135 11/07/2020   K 4.9 11/07/2020   CO2 22 11/07/2020   GLUCOSE 255 (H) 11/07/2020   BUN 23 11/07/2020   CREATININE 1.61 (H) 11/07/2020   BILITOT 0.3 11/07/2020   ALKPHOS 69 11/07/2020   AST 19 11/07/2020   ALT 22 11/07/2020   PROT 6.3 11/07/2020   ALBUMIN 4.1 11/07/2020   CALCIUM 9.2 11/07/2020   EGFR 35 (L) 11/07/2020   GFR 71.18 01/26/2015   Lab Results  Component Value Date   CHOL 173 09/14/2020   Lab Results  Component Value Date   HDL 70 09/14/2020   Lab Results  Component Value Date   LDLCALC 80 09/14/2020   Lab Results  Component Value Date    TRIG 132 09/14/2020   Lab Results  Component Value Date   CHOLHDL 2.5 09/14/2020   Lab Results  Component Value Date   HGBA1C 8.9 (H) 09/14/2020       Assessment & Plan:  Diagnoses and all orders for this visit: Closed fracture of anatomical neck of left humerus, initial encounter Patient was seen for closed fracture of the anatomical neck of the left humerus he she was initially admitted after a fall and was evaluated.  She was discharged and then sent for rehab but claps which she did not do well because she became confused and would not cooperate with physical therapy.  Her mental status has continued to go down and her Mini-Mental status is now 16 out of 30.  Malnutrition of moderate degree (HCC) -     megestrol (MEGACE) 20 MG tablet; Take 1 tablet (20 mg total) by mouth daily. Patient is eating better on Megace and is starting to gain some weight but still is malnourished.  Late onset Alzheimer's dementia with behavioral disturbance ([place])  Patient's Alzheimer's dementia has gotten much worse with mini mental status dropping to 16 out of 30.  She requires assistance with dressing and most of her ADLs except for eating.  She is unable to perform any of her IADLs at present.  Her husband is in the hospital.  Family is trying to get her  Medicaid so she may be able to get full-time care at home.  I completed the [place] 2.       I spent 40 minutes dedicated to the care of this patient on the date of this encounter to include face-to-face time with the patient, as well as: review hospital records  Follow-up: Return in about 1 month (around 01/14/2021) for diabetes.  An After Visit Summary was printed and given to the patient.  Reinaldo Meeker, MD [facility] [phone]

## 2020-12-20 ENCOUNTER — Other Ambulatory Visit: Payer: Self-pay | Admitting: Legal Medicine

## 2020-12-21 DIAGNOSIS — E1165 Type 2 diabetes mellitus with hyperglycemia: Secondary | ICD-10-CM | POA: Diagnosis not present

## 2020-12-21 DIAGNOSIS — I129 Hypertensive chronic kidney disease with stage 1 through stage 4 chronic kidney disease, or unspecified chronic kidney disease: Secondary | ICD-10-CM | POA: Diagnosis not present

## 2020-12-21 DIAGNOSIS — E46 Unspecified protein-calorie malnutrition: Secondary | ICD-10-CM | POA: Diagnosis not present

## 2020-12-21 DIAGNOSIS — Z794 Long term (current) use of insulin: Secondary | ICD-10-CM | POA: Diagnosis not present

## 2020-12-21 DIAGNOSIS — E1122 Type 2 diabetes mellitus with diabetic chronic kidney disease: Secondary | ICD-10-CM | POA: Diagnosis not present

## 2020-12-21 DIAGNOSIS — Z9181 History of falling: Secondary | ICD-10-CM | POA: Diagnosis not present

## 2020-12-21 DIAGNOSIS — F339 Major depressive disorder, recurrent, unspecified: Secondary | ICD-10-CM | POA: Diagnosis not present

## 2020-12-21 DIAGNOSIS — M80022D Age-related osteoporosis with current pathological fracture, left humerus, subsequent encounter for fracture with routine healing: Secondary | ICD-10-CM | POA: Diagnosis not present

## 2020-12-21 DIAGNOSIS — F0390 Unspecified dementia without behavioral disturbance: Secondary | ICD-10-CM | POA: Diagnosis not present

## 2020-12-21 DIAGNOSIS — D631 Anemia in chronic kidney disease: Secondary | ICD-10-CM | POA: Diagnosis not present

## 2020-12-21 DIAGNOSIS — N183 Chronic kidney disease, stage 3 unspecified: Secondary | ICD-10-CM | POA: Diagnosis not present

## 2020-12-21 DIAGNOSIS — E7800 Pure hypercholesterolemia, unspecified: Secondary | ICD-10-CM | POA: Diagnosis not present

## 2020-12-22 ENCOUNTER — Ambulatory Visit: Payer: Medicare PPO | Admitting: Legal Medicine

## 2020-12-24 ENCOUNTER — Telehealth: Payer: Self-pay

## 2020-12-24 NOTE — Telephone Encounter (Signed)
Called patient to schedule AWV, unable to leave voicemail due to Voicemail not being set up.

## 2020-12-26 DIAGNOSIS — M810 Age-related osteoporosis without current pathological fracture: Secondary | ICD-10-CM | POA: Diagnosis not present

## 2020-12-26 DIAGNOSIS — E46 Unspecified protein-calorie malnutrition: Secondary | ICD-10-CM | POA: Diagnosis not present

## 2020-12-26 DIAGNOSIS — Z9181 History of falling: Secondary | ICD-10-CM | POA: Diagnosis not present

## 2020-12-26 DIAGNOSIS — D631 Anemia in chronic kidney disease: Secondary | ICD-10-CM | POA: Diagnosis not present

## 2020-12-26 DIAGNOSIS — I129 Hypertensive chronic kidney disease with stage 1 through stage 4 chronic kidney disease, or unspecified chronic kidney disease: Secondary | ICD-10-CM | POA: Diagnosis not present

## 2020-12-26 DIAGNOSIS — M80022D Age-related osteoporosis with current pathological fracture, left humerus, subsequent encounter for fracture with routine healing: Secondary | ICD-10-CM | POA: Diagnosis not present

## 2020-12-26 DIAGNOSIS — F339 Major depressive disorder, recurrent, unspecified: Secondary | ICD-10-CM | POA: Diagnosis not present

## 2020-12-26 DIAGNOSIS — N183 Chronic kidney disease, stage 3 unspecified: Secondary | ICD-10-CM | POA: Diagnosis not present

## 2020-12-26 DIAGNOSIS — Z794 Long term (current) use of insulin: Secondary | ICD-10-CM

## 2020-12-26 DIAGNOSIS — E1165 Type 2 diabetes mellitus with hyperglycemia: Secondary | ICD-10-CM | POA: Diagnosis not present

## 2020-12-26 DIAGNOSIS — E1122 Type 2 diabetes mellitus with diabetic chronic kidney disease: Secondary | ICD-10-CM | POA: Diagnosis not present

## 2020-12-26 DIAGNOSIS — E7800 Pure hypercholesterolemia, unspecified: Secondary | ICD-10-CM | POA: Diagnosis not present

## 2020-12-26 DIAGNOSIS — F0390 Unspecified dementia without behavioral disturbance: Secondary | ICD-10-CM | POA: Diagnosis not present

## 2020-12-27 ENCOUNTER — Ambulatory Visit (INDEPENDENT_AMBULATORY_CARE_PROVIDER_SITE_OTHER): Payer: Medicare Other | Admitting: Legal Medicine

## 2020-12-27 ENCOUNTER — Encounter: Payer: Self-pay | Admitting: Legal Medicine

## 2020-12-27 ENCOUNTER — Other Ambulatory Visit: Payer: Self-pay

## 2020-12-27 VITALS — BP 96/70 | HR 98 | Temp 97.7°F | Ht 64.0 in | Wt 103.0 lb

## 2020-12-27 DIAGNOSIS — E782 Mixed hyperlipidemia: Secondary | ICD-10-CM | POA: Diagnosis not present

## 2020-12-27 DIAGNOSIS — E1121 Type 2 diabetes mellitus with diabetic nephropathy: Secondary | ICD-10-CM

## 2020-12-27 DIAGNOSIS — I10 Essential (primary) hypertension: Secondary | ICD-10-CM

## 2020-12-27 DIAGNOSIS — E1142 Type 2 diabetes mellitus with diabetic polyneuropathy: Secondary | ICD-10-CM | POA: Diagnosis not present

## 2020-12-27 MED ORDER — TRULICITY 1.5 MG/0.5ML ~~LOC~~ SOAJ: 1.5000 mg | SUBCUTANEOUS | 6 refills | Status: DC

## 2020-12-27 MED ORDER — LISINOPRIL 2.5 MG PO TABS: ORAL_TABLET | ORAL | 2 refills | Status: DC

## 2020-12-27 NOTE — Progress Notes (Signed)
Established Patient Office Visit  Subjective:  Patient ID: Donna Moran, female    DOB: 10-20-1955  Age: 65 y.o. MRN: [id number]  CC:  Chief Complaint  Patient presents with   Gastroesophageal Reflux   Hyperlipidemia   Hypertension   Diabetes    HPI Donna Moran presents for chronic visit  Patient present with type 2 diabetes.  Specifically, this is type 2, noninsulin requiring diabetes, complicated by glomerulopathy ans neuropathy.  Compliance with treatment has been good; patient take medicines as directed, maintains diet and exercise regimen, follows up as directed, and is keeping glucose diary.  Date of  diagnosis 2020.  Depression screen has been performed.Tobacco screen none. Current medicines for diabetes trulicity .  Patient is on lisinopril for renal protection and crestor for cholesterol control.  Patient performs foot exams daily and last ophthalmologic exam was yes  Patient presents for follow up of hypertension.  Patient tolerating lisinopril well with side effects.  Patient was diagnosed with hypertension 2010 so has been treated for hypertension for 10 years.Patient is working on maintaining diet and exercise regimen and follows up as directed. Complication include none. .  Patient is stronger and ambulating and gaining weigh.  Sh ecan have snacks Past Medical History:  Diagnosis Date   Diabetes mellitus without complication ([place])    History of blood transfusion    Hyperlipidemia     Past Surgical History:  Procedure Laterality Date   CATARACT EXTRACTION Bilateral 04/2013-05/2013    Family History  Problem Relation Age of Onset   Cancer Neg Hx    Diabetes Neg Hx    Stroke Neg Hx     Social History   Socioeconomic History   Marital status: Married    Spouse name: Not on file   Number of children: 2   Years of education: Not on file   Highest education level: Not on file  Occupational History   Occupation: Unemployed    Employer: NOT EMPLOYED   Tobacco Use   Smoking status: Former    Types: Cigarettes    Quit date: 2012    Years since quitting: 10.7   Smokeless tobacco: Never  Substance and Sexual Activity   Alcohol use: No   Drug use: No   Sexual activity: Yes  Other Topics Concern   Not on file  Social History Narrative   Regular exercise-no   Caffeine Use-yes   Social Determinants of Health   Financial Resource Strain: Not on file  Food Insecurity: Not on file  Transportation Needs: Not on file  Physical Activity: Not on file  Stress: Not on file  Social Connections: Not on file  Intimate Partner Violence: Not on file    Outpatient Medications Prior to Visit  Medication Sig Dispense Refill   B-D UF III MINI PEN NEEDLES 31G X 5 MM MISC Inject 1 each into the skin 3 (three) times daily. 30 each 2   Blood Glucose Monitoring Suppl (ACCU-CHEK GUIDE) w/Device KIT USE TO CHECK GLUCOSE IN THE MORNING AND WITH MEALS     Continuous Blood Gluc Sensor (FREESTYLE LIBRE 14 DAY SENSOR) MISC 1 each by Does not apply route in the morning, at noon, and at bedtime. 6 each 3   donepezil (ARICEPT) 10 MG tablet Take 1 tablet (10 mg total) by mouth at bedtime. 90 tablet 2   glucose blood (FREESTYLE LITE) test strip 1 each by Other route daily before breakfast. Use as instructed 300 strip 2   megestrol (MEGACE) 20 MG  tablet Take 1 tablet (20 mg total) by mouth daily. 30 tablet 3   metoCLOPramide (REGLAN) 5 MG tablet Take 1 tablet (5 mg total) by mouth 4 (four) times daily. 120 tablet 2   ondansetron (ZOFRAN) 4 MG tablet Take 1 tablet (4 mg total) by mouth every 8 (eight) hours as needed for nausea or vomiting. 20 tablet 0   oxyCODONE (OXY IR/ROXICODONE) 5 MG immediate release tablet Take by mouth.     rosuvastatin (CRESTOR) 10 MG tablet Take 10 mg by mouth at bedtime.     Dulaglutide (TRULICITY) 1.17 BV/6.7OL SOPN Inject 0.75 mg into the skin once a week. 4 mL 2   lisinopril (ZESTRIL) 2.5 MG tablet TAKE 1 TABLET(2.5 MG) BY MOUTH DAILY  90 tablet 2   No facility-administered medications prior to visit.    Allergies  Allergen Reactions   Aspirin Nausea Only    Can take coated aspirin    ROS Review of Systems  Constitutional:  Negative for activity change and appetite change.  HENT:  Negative for congestion.   Eyes:  Negative for visual disturbance.  Respiratory:  Negative for chest tightness and shortness of breath.   Gastrointestinal:  Negative for abdominal distention and abdominal pain.  Endocrine: Negative for polyuria.  Genitourinary:  Negative for difficulty urinating and dysuria.  Musculoskeletal:  Negative for arthralgias and back pain.  Skin: Negative.   Neurological: Negative.   Psychiatric/Behavioral: Negative.       Objective:    Physical Exam Vitals reviewed.  Constitutional:      General: She is in acute distress.     Appearance: Normal appearance.  HENT:     Right Ear: Tympanic membrane normal.     Left Ear: Tympanic membrane normal.     Nose: Nose normal.     Mouth/Throat:     Mouth: Mucous membranes are moist.  Eyes:     Extraocular Movements: Extraocular movements intact.     Pupils: Pupils are equal, round, and reactive to light.  Cardiovascular:     Rate and Rhythm: Normal rate.     Pulses: Normal pulses.     Heart sounds: Normal heart sounds. No murmur heard.   No gallop.  Pulmonary:     Effort: No respiratory distress.     Breath sounds: No wheezing.  Abdominal:     General: Abdomen is flat. Bowel sounds are normal. There is no distension.     Palpations: Abdomen is soft.     Tenderness: There is no abdominal tenderness.  Musculoskeletal:        General: Normal range of motion.     Cervical back: Normal range of motion.  Skin:    General: Skin is warm.     Capillary Refill: Capillary refill takes less than 2 seconds.  Neurological:     General: No focal deficit present.     Mental Status: She is alert and oriented to person, place, and time.    BP 96/70   Pulse  98   Temp 97.7 F (36.5 C)   Ht '5\' 4"'  (1.626 m)   Wt 103 lb (46.7 kg)   SpO2 98%   BMI 17.68 kg/m  Wt Readings from Last 3 Encounters:  12/27/20 103 lb (46.7 kg)  12/15/20 105 lb (47.6 kg)  11/07/20 105 lb (47.6 kg)     Health Maintenance Due  Topic Date Due   Hepatitis C Screening  Never done   Zoster Vaccines- Shingrix (1 of 2) Never done  MAMMOGRAM  03/25/2016   PNA vac Low Risk Adult (2 of 2 - PPSV23) 01/02/2019   COVID-19 Vaccine (3 - Booster for Pfizer series) 02/05/2020   OPHTHALMOLOGY EXAM  09/28/2020   INFLUENZA VACCINE  11/14/2020    There are no preventive care reminders to display for this patient.  Lab Results  Component Value Date   TSH 0.67 10/07/2012   Lab Results  Component Value Date   WBC 6.8 09/14/2020   HGB 10.2 (L) 09/14/2020   HCT 31.5 (L) 09/14/2020   MCV 92 09/14/2020   PLT 260 09/14/2020   Lab Results  Component Value Date   NA 135 11/07/2020   K 4.9 11/07/2020   CO2 22 11/07/2020   GLUCOSE 255 (H) 11/07/2020   BUN 23 11/07/2020   CREATININE 1.61 (H) 11/07/2020   BILITOT 0.3 11/07/2020   ALKPHOS 69 11/07/2020   AST 19 11/07/2020   ALT 22 11/07/2020   PROT 6.3 11/07/2020   ALBUMIN 4.1 11/07/2020   CALCIUM 9.2 11/07/2020   EGFR 35 (L) 11/07/2020   GFR 71.18 01/26/2015   Lab Results  Component Value Date   CHOL 173 09/14/2020   Lab Results  Component Value Date   HDL 70 09/14/2020   Lab Results  Component Value Date   LDLCALC 80 09/14/2020   Lab Results  Component Value Date   TRIG 132 09/14/2020   Lab Results  Component Value Date   CHOLHDL 2.5 09/14/2020   Lab Results  Component Value Date   HGBA1C 8.9 (H) 09/14/2020      Assessment & Plan:   Problem List Items Addressed This Visit       Cardiovascular and Mediastinum   Essential hypertension, benign   Relevant Medications   lisinopril (ZESTRIL) 2.5 MG tablet   Other Relevant Orders   CBC with Differential   Comprehensive metabolic panel An  individual hypertension care plan was established and reinforced today.  The patient's status was assessed using clinical findings on exam and labs or diagnostic tests. The patient's success at meeting treatment goals on disease specific evidence-based guidelines and found to be well controlled. SELF MANAGEMENT: The patient and I together assessed ways to personally work towards obtaining the recommended goals. RECOMMENDATIONS: avoid decongestants found in common cold remedies, decrease consumption of alcohol, perform routine monitoring of BP with home BP cuff, exercise, reduction of dietary salt, take medicines as prescribed, try not to miss doses and quit smoking.  Regular exercise and maintaining a healthy weight is needed.  Stress reduction may help. A CLINICAL SUMMARY including written plan identify barriers to care unique to individual due to social or financial issues.  We attempt to mutually creat solutions for individual and family understanding.      Other   Mixed hyperlipidemia - Primary   Relevant Medications   lisinopril (ZESTRIL) 2.5 MG tablet   Other Relevant Orders   Lipid panel AN INDIVIDUAL CARE PLAN for hyperlipidemia/ cholesterol was established and reinforced today.  The patient's status was assessed using clinical findings on exam, lab and other diagnostic tests. The patient's disease status was assessed based on evidence-based guidelines and found to be well controlled. MEDICATIONS were reviewed. SELF MANAGEMENT GOALS have been discussed and patient's success at attaining the goal of low cholesterol was assessed. RECOMMENDATION given include regular exercise 3 days a week and low cholesterol/low fat diet. CLINICAL SUMMARY including written plan to identify barriers unique to the patient due to social or economic  reasons was discussed.  Other Visit Diagnoses     Type 2 diabetes mellitus with diabetic polyneuropathy, unspecified whether long term insulin use (HCC)        Relevant Medications   lisinopril (ZESTRIL) 2.5 MG tablet   Other Relevant Orders   Hemoglobin A1c An individual care plan for diabetes was established and reinforced today.  The patient's status was assessed using clinical findings on exam, labs and diagnostic testing. Patient success at meeting goals based on disease specific evidence-based guidelines and found to be good controlled. Medications were assessed and patient's understanding of the medical issues , including barriers were assessed. Increase trulitiy to 1.14m and add snacks Recommend adherence to a diabetic diet, a graduated exercise program, HgbA1c level is checked quarterly, and urine microalbumin performed yearly .  Annual mono-filament sensation testing performed. Lower blood pressure and control hyperlipidemia is important. Get annual eye exams and annual flu shots and smoking cessation discussed.  Self management goals were discussed.        Meds ordered this encounter  Medications   lisinopril (ZESTRIL) 2.5 MG tablet    Sig: TAKE 1 TABLET(2.5 MG) BY MOUTH DAILY    Dispense:  90 tablet    Refill:  2   Dulaglutide (TRULICITY) 1.5 MMO/7.0BESOPN    Sig: Inject 1.5 mg into the skin once a week.    Dispense:  6 mL    Refill:  6    Follow-up: Return in about 4 months (around 04/28/2021) for fasting.    LReinaldo Meeker MD

## 2020-12-28 DIAGNOSIS — S42202A Unspecified fracture of upper end of left humerus, initial encounter for closed fracture: Secondary | ICD-10-CM | POA: Diagnosis not present

## 2020-12-28 LAB — CBC WITH DIFFERENTIAL/PLATELET
Basophils Absolute: 0.1 10*3/uL (ref 0.0–0.2)
Basos: 1 %
EOS (ABSOLUTE): 0.1 10*3/uL (ref 0.0–0.4)
Eos: 1 %
Hematocrit: 30.6 % — ABNORMAL LOW (ref 34.0–46.6)
Hemoglobin: 9.7 g/dL — ABNORMAL LOW (ref 11.1–15.9)
Immature Grans (Abs): 0 10*3/uL (ref 0.0–0.1)
Immature Granulocytes: 0 %
Lymphocytes Absolute: 1.3 10*3/uL (ref 0.7–3.1)
Lymphs: 14 %
MCH: 29.2 pg (ref 26.6–33.0)
MCHC: 31.7 g/dL (ref 31.5–35.7)
MCV: 92 fL (ref 79–97)
Monocytes Absolute: 0.7 10*3/uL (ref 0.1–0.9)
Monocytes: 8 %
Neutrophils Absolute: 6.6 10*3/uL (ref 1.4–7.0)
Neutrophils: 76 %
Platelets: 491 10*3/uL — ABNORMAL HIGH (ref 150–450)
RBC: 3.32 x10E6/uL — ABNORMAL LOW (ref 3.77–5.28)
RDW: 13 % (ref 11.7–15.4)
WBC: 8.8 10*3/uL (ref 3.4–10.8)

## 2020-12-28 LAB — COMPREHENSIVE METABOLIC PANEL
ALT: 19 IU/L (ref 0–32)
AST: 19 IU/L (ref 0–40)
Albumin/Globulin Ratio: 1.4 (ref 1.2–2.2)
Albumin: 3.8 g/dL (ref 3.8–4.8)
Alkaline Phosphatase: 89 IU/L (ref 44–121)
BUN/Creatinine Ratio: 26 (ref 12–28)
BUN: 36 mg/dL — ABNORMAL HIGH (ref 8–27)
Bilirubin Total: <0.2 mg/dL (ref 0.0–1.2)
CO2: 21 mmol/L (ref 20–29)
Calcium: 9.6 mg/dL (ref 8.7–10.3)
Chloride: 102 mmol/L (ref 96–106)
Creatinine, Ser: 1.37 mg/dL — ABNORMAL HIGH (ref 0.57–1.00)
Globulin, Total: 2.8 g/dL (ref 1.5–4.5)
Glucose: 254 mg/dL — ABNORMAL HIGH (ref 65–99)
Potassium: 4.6 mmol/L (ref 3.5–5.2)
Sodium: 139 mmol/L (ref 134–144)
Total Protein: 6.6 g/dL (ref 6.0–8.5)
eGFR: 42 mL/min/{1.73_m2} — ABNORMAL LOW (ref >59)

## 2020-12-28 LAB — LIPID PANEL
Chol/HDL Ratio: 2.8 ratio (ref 0.0–4.4)
Cholesterol, Total: 200 mg/dL — ABNORMAL HIGH (ref 100–199)
HDL: 71 mg/dL (ref >39)
LDL Chol Calc (NIH): 111 mg/dL — ABNORMAL HIGH (ref 0–99)
Triglycerides: 100 mg/dL (ref 0–149)
VLDL Cholesterol Cal: 18 mg/dL (ref 5–40)

## 2020-12-28 LAB — HEMOGLOBIN A1C
Est. average glucose Bld gHb Est-mCnc: 148 mg/dL
Hgb A1c MFr Bld: 6.8 % — ABNORMAL HIGH (ref 4.8–5.6)

## 2020-12-28 LAB — CARDIOVASCULAR RISK ASSESSMENT

## 2020-12-28 NOTE — Progress Notes (Signed)
Hemoglobin and hematocrit slightly lower, we will watch, platelets ok, glucose 254, kidney tests stage 3b,liver tests good, LDL cholesterol 111 lower but still above normal, continue medicines lp

## 2021-01-01 DIAGNOSIS — F0390 Unspecified dementia without behavioral disturbance: Secondary | ICD-10-CM | POA: Diagnosis not present

## 2021-01-01 DIAGNOSIS — I313 Pericardial effusion (noninflammatory): Secondary | ICD-10-CM | POA: Diagnosis not present

## 2021-01-01 DIAGNOSIS — F0391 Unspecified dementia with behavioral disturbance: Secondary | ICD-10-CM | POA: Diagnosis not present

## 2021-01-01 DIAGNOSIS — I10 Essential (primary) hypertension: Secondary | ICD-10-CM | POA: Diagnosis not present

## 2021-01-01 DIAGNOSIS — R911 Solitary pulmonary nodule: Secondary | ICD-10-CM | POA: Diagnosis not present

## 2021-01-01 DIAGNOSIS — Z79899 Other long term (current) drug therapy: Secondary | ICD-10-CM | POA: Diagnosis not present

## 2021-01-01 DIAGNOSIS — Z7984 Long term (current) use of oral hypoglycemic drugs: Secondary | ICD-10-CM | POA: Diagnosis not present

## 2021-01-01 DIAGNOSIS — K869 Disease of pancreas, unspecified: Secondary | ICD-10-CM | POA: Diagnosis not present

## 2021-01-01 DIAGNOSIS — J439 Emphysema, unspecified: Secondary | ICD-10-CM | POA: Diagnosis not present

## 2021-01-01 DIAGNOSIS — R531 Weakness: Secondary | ICD-10-CM | POA: Diagnosis not present

## 2021-01-01 DIAGNOSIS — Z79891 Long term (current) use of opiate analgesic: Secondary | ICD-10-CM | POA: Diagnosis not present

## 2021-01-01 DIAGNOSIS — E119 Type 2 diabetes mellitus without complications: Secondary | ICD-10-CM | POA: Diagnosis not present

## 2021-01-01 DIAGNOSIS — I700 Atherosclerosis of aorta: Secondary | ICD-10-CM | POA: Diagnosis not present

## 2021-01-01 DIAGNOSIS — R410 Disorientation, unspecified: Secondary | ICD-10-CM | POA: Diagnosis not present

## 2021-01-01 DIAGNOSIS — R918 Other nonspecific abnormal finding of lung field: Secondary | ICD-10-CM | POA: Diagnosis not present

## 2021-01-01 DIAGNOSIS — R2681 Unsteadiness on feet: Secondary | ICD-10-CM | POA: Diagnosis not present

## 2021-01-01 DIAGNOSIS — S42202A Unspecified fracture of upper end of left humerus, initial encounter for closed fracture: Secondary | ICD-10-CM | POA: Diagnosis not present

## 2021-01-02 ENCOUNTER — Other Ambulatory Visit: Payer: Self-pay

## 2021-01-02 DIAGNOSIS — I10 Essential (primary) hypertension: Secondary | ICD-10-CM

## 2021-01-02 MED ORDER — LISINOPRIL 2.5 MG PO TABS: ORAL_TABLET | ORAL | 2 refills | Status: DC

## 2021-01-02 NOTE — Telephone Encounter (Signed)
Pharmacy did not receive script sent 9/13. Resent medication.

## 2021-01-04 DIAGNOSIS — D631 Anemia in chronic kidney disease: Secondary | ICD-10-CM | POA: Diagnosis not present

## 2021-01-04 DIAGNOSIS — M80022D Age-related osteoporosis with current pathological fracture, left humerus, subsequent encounter for fracture with routine healing: Secondary | ICD-10-CM | POA: Diagnosis not present

## 2021-01-04 DIAGNOSIS — E1122 Type 2 diabetes mellitus with diabetic chronic kidney disease: Secondary | ICD-10-CM | POA: Diagnosis not present

## 2021-01-04 DIAGNOSIS — E1165 Type 2 diabetes mellitus with hyperglycemia: Secondary | ICD-10-CM | POA: Diagnosis not present

## 2021-01-04 DIAGNOSIS — N183 Chronic kidney disease, stage 3 unspecified: Secondary | ICD-10-CM | POA: Diagnosis not present

## 2021-01-04 DIAGNOSIS — I129 Hypertensive chronic kidney disease with stage 1 through stage 4 chronic kidney disease, or unspecified chronic kidney disease: Secondary | ICD-10-CM | POA: Diagnosis not present

## 2021-01-06 ENCOUNTER — Other Ambulatory Visit: Payer: Self-pay | Admitting: Legal Medicine

## 2021-01-06 DIAGNOSIS — F320 Major depressive disorder, single episode, mild: Secondary | ICD-10-CM

## 2021-01-06 DIAGNOSIS — D631 Anemia in chronic kidney disease: Secondary | ICD-10-CM | POA: Diagnosis not present

## 2021-01-06 DIAGNOSIS — N183 Chronic kidney disease, stage 3 unspecified: Secondary | ICD-10-CM | POA: Diagnosis not present

## 2021-01-06 DIAGNOSIS — I129 Hypertensive chronic kidney disease with stage 1 through stage 4 chronic kidney disease, or unspecified chronic kidney disease: Secondary | ICD-10-CM | POA: Diagnosis not present

## 2021-01-06 DIAGNOSIS — M80022D Age-related osteoporosis with current pathological fracture, left humerus, subsequent encounter for fracture with routine healing: Secondary | ICD-10-CM | POA: Diagnosis not present

## 2021-01-06 DIAGNOSIS — E1122 Type 2 diabetes mellitus with diabetic chronic kidney disease: Secondary | ICD-10-CM | POA: Diagnosis not present

## 2021-01-06 DIAGNOSIS — E1165 Type 2 diabetes mellitus with hyperglycemia: Secondary | ICD-10-CM | POA: Diagnosis not present

## 2021-01-07 DIAGNOSIS — N3000 Acute cystitis without hematuria: Secondary | ICD-10-CM | POA: Diagnosis not present

## 2021-01-07 DIAGNOSIS — R4182 Altered mental status, unspecified: Secondary | ICD-10-CM | POA: Diagnosis not present

## 2021-01-07 DIAGNOSIS — D649 Anemia, unspecified: Secondary | ICD-10-CM | POA: Diagnosis not present

## 2021-01-07 DIAGNOSIS — R0902 Hypoxemia: Secondary | ICD-10-CM | POA: Diagnosis not present

## 2021-01-07 DIAGNOSIS — W19XXXA Unspecified fall, initial encounter: Secondary | ICD-10-CM | POA: Diagnosis not present

## 2021-01-07 DIAGNOSIS — F0390 Unspecified dementia without behavioral disturbance: Secondary | ICD-10-CM | POA: Diagnosis not present

## 2021-01-07 DIAGNOSIS — R260 Ataxic gait: Secondary | ICD-10-CM | POA: Diagnosis not present

## 2021-01-08 DIAGNOSIS — M1380 Other specified arthritis, unspecified site: Secondary | ICD-10-CM | POA: Diagnosis not present

## 2021-01-08 DIAGNOSIS — N3000 Acute cystitis without hematuria: Secondary | ICD-10-CM | POA: Diagnosis not present

## 2021-01-08 DIAGNOSIS — Z7984 Long term (current) use of oral hypoglycemic drugs: Secondary | ICD-10-CM | POA: Diagnosis not present

## 2021-01-08 DIAGNOSIS — R110 Nausea: Secondary | ICD-10-CM | POA: Diagnosis not present

## 2021-01-08 DIAGNOSIS — Z8781 Personal history of (healed) traumatic fracture: Secondary | ICD-10-CM | POA: Diagnosis not present

## 2021-01-08 DIAGNOSIS — R260 Ataxic gait: Secondary | ICD-10-CM | POA: Diagnosis not present

## 2021-01-08 DIAGNOSIS — S42202D Unspecified fracture of upper end of left humerus, subsequent encounter for fracture with routine healing: Secondary | ICD-10-CM | POA: Diagnosis not present

## 2021-01-08 DIAGNOSIS — G9341 Metabolic encephalopathy: Secondary | ICD-10-CM | POA: Diagnosis not present

## 2021-01-08 DIAGNOSIS — N390 Urinary tract infection, site not specified: Secondary | ICD-10-CM | POA: Diagnosis not present

## 2021-01-08 DIAGNOSIS — E785 Hyperlipidemia, unspecified: Secondary | ICD-10-CM | POA: Diagnosis present

## 2021-01-08 DIAGNOSIS — I10 Essential (primary) hypertension: Secondary | ICD-10-CM | POA: Diagnosis not present

## 2021-01-08 DIAGNOSIS — M25512 Pain in left shoulder: Secondary | ICD-10-CM | POA: Diagnosis not present

## 2021-01-08 DIAGNOSIS — R296 Repeated falls: Secondary | ICD-10-CM | POA: Diagnosis not present

## 2021-01-08 DIAGNOSIS — F0390 Unspecified dementia without behavioral disturbance: Secondary | ICD-10-CM | POA: Diagnosis not present

## 2021-01-08 DIAGNOSIS — R531 Weakness: Secondary | ICD-10-CM | POA: Diagnosis not present

## 2021-01-08 DIAGNOSIS — R488 Other symbolic dysfunctions: Secondary | ICD-10-CM | POA: Diagnosis not present

## 2021-01-08 DIAGNOSIS — R1312 Dysphagia, oropharyngeal phase: Secondary | ICD-10-CM | POA: Diagnosis not present

## 2021-01-08 DIAGNOSIS — Z9181 History of falling: Secondary | ICD-10-CM | POA: Diagnosis not present

## 2021-01-08 DIAGNOSIS — B9620 Unspecified Escherichia coli [E. coli] as the cause of diseases classified elsewhere: Secondary | ICD-10-CM | POA: Diagnosis not present

## 2021-01-08 DIAGNOSIS — K219 Gastro-esophageal reflux disease without esophagitis: Secondary | ICD-10-CM | POA: Diagnosis not present

## 2021-01-08 DIAGNOSIS — R410 Disorientation, unspecified: Secondary | ICD-10-CM | POA: Diagnosis not present

## 2021-01-08 DIAGNOSIS — I129 Hypertensive chronic kidney disease with stage 1 through stage 4 chronic kidney disease, or unspecified chronic kidney disease: Secondary | ICD-10-CM | POA: Diagnosis present

## 2021-01-08 DIAGNOSIS — Z79891 Long term (current) use of opiate analgesic: Secondary | ICD-10-CM | POA: Diagnosis not present

## 2021-01-08 DIAGNOSIS — R262 Difficulty in walking, not elsewhere classified: Secondary | ICD-10-CM | POA: Diagnosis not present

## 2021-01-08 DIAGNOSIS — N1831 Chronic kidney disease, stage 3a: Secondary | ICD-10-CM | POA: Diagnosis present

## 2021-01-08 DIAGNOSIS — E119 Type 2 diabetes mellitus without complications: Secondary | ICD-10-CM | POA: Diagnosis not present

## 2021-01-08 DIAGNOSIS — R279 Unspecified lack of coordination: Secondary | ICD-10-CM | POA: Diagnosis not present

## 2021-01-08 DIAGNOSIS — R2681 Unsteadiness on feet: Secondary | ICD-10-CM | POA: Diagnosis not present

## 2021-01-08 DIAGNOSIS — R4182 Altered mental status, unspecified: Secondary | ICD-10-CM | POA: Diagnosis not present

## 2021-01-08 DIAGNOSIS — F331 Major depressive disorder, recurrent, moderate: Secondary | ICD-10-CM | POA: Diagnosis not present

## 2021-01-08 DIAGNOSIS — Z743 Need for continuous supervision: Secondary | ICD-10-CM | POA: Diagnosis not present

## 2021-01-08 DIAGNOSIS — S42292D Other displaced fracture of upper end of left humerus, subsequent encounter for fracture with routine healing: Secondary | ICD-10-CM | POA: Diagnosis not present

## 2021-01-08 DIAGNOSIS — Z79899 Other long term (current) drug therapy: Secondary | ICD-10-CM | POA: Diagnosis not present

## 2021-01-08 DIAGNOSIS — Z681 Body mass index (BMI) 19 or less, adult: Secondary | ICD-10-CM | POA: Diagnosis not present

## 2021-01-08 DIAGNOSIS — D649 Anemia, unspecified: Secondary | ICD-10-CM | POA: Diagnosis not present

## 2021-01-08 DIAGNOSIS — E43 Unspecified severe protein-calorie malnutrition: Secondary | ICD-10-CM | POA: Diagnosis not present

## 2021-01-08 DIAGNOSIS — E1122 Type 2 diabetes mellitus with diabetic chronic kidney disease: Secondary | ICD-10-CM | POA: Diagnosis present

## 2021-01-08 DIAGNOSIS — N183 Chronic kidney disease, stage 3 unspecified: Secondary | ICD-10-CM | POA: Diagnosis not present

## 2021-01-08 DIAGNOSIS — S42292A Other displaced fracture of upper end of left humerus, initial encounter for closed fracture: Secondary | ICD-10-CM | POA: Diagnosis not present

## 2021-01-08 DIAGNOSIS — F339 Major depressive disorder, recurrent, unspecified: Secondary | ICD-10-CM | POA: Diagnosis not present

## 2021-01-09 ENCOUNTER — Ambulatory Visit: Payer: Medicare PPO | Admitting: Legal Medicine

## 2021-01-11 DIAGNOSIS — E1100 Type 2 diabetes mellitus with hyperosmolarity without nonketotic hyperglycemic-hyperosmolar coma (NKHHC): Secondary | ICD-10-CM | POA: Diagnosis not present

## 2021-01-11 DIAGNOSIS — K219 Gastro-esophageal reflux disease without esophagitis: Secondary | ICD-10-CM | POA: Diagnosis not present

## 2021-01-11 DIAGNOSIS — R279 Unspecified lack of coordination: Secondary | ICD-10-CM | POA: Diagnosis not present

## 2021-01-11 DIAGNOSIS — R739 Hyperglycemia, unspecified: Secondary | ICD-10-CM | POA: Diagnosis not present

## 2021-01-11 DIAGNOSIS — S42292D Other displaced fracture of upper end of left humerus, subsequent encounter for fracture with routine healing: Secondary | ICD-10-CM | POA: Diagnosis not present

## 2021-01-11 DIAGNOSIS — M1380 Other specified arthritis, unspecified site: Secondary | ICD-10-CM | POA: Diagnosis not present

## 2021-01-11 DIAGNOSIS — R7401 Elevation of levels of liver transaminase levels: Secondary | ICD-10-CM | POA: Diagnosis present

## 2021-01-11 DIAGNOSIS — I10 Essential (primary) hypertension: Secondary | ICD-10-CM | POA: Diagnosis present

## 2021-01-11 DIAGNOSIS — J90 Pleural effusion, not elsewhere classified: Secondary | ICD-10-CM | POA: Diagnosis not present

## 2021-01-11 DIAGNOSIS — F339 Major depressive disorder, recurrent, unspecified: Secondary | ICD-10-CM | POA: Diagnosis not present

## 2021-01-11 DIAGNOSIS — N183 Chronic kidney disease, stage 3 unspecified: Secondary | ICD-10-CM | POA: Diagnosis not present

## 2021-01-11 DIAGNOSIS — E785 Hyperlipidemia, unspecified: Secondary | ICD-10-CM | POA: Diagnosis not present

## 2021-01-11 DIAGNOSIS — Z79899 Other long term (current) drug therapy: Secondary | ICD-10-CM | POA: Diagnosis not present

## 2021-01-11 DIAGNOSIS — Z20822 Contact with and (suspected) exposure to covid-19: Secondary | ICD-10-CM | POA: Diagnosis present

## 2021-01-11 DIAGNOSIS — F0390 Unspecified dementia without behavioral disturbance: Secondary | ICD-10-CM | POA: Diagnosis present

## 2021-01-11 DIAGNOSIS — D649 Anemia, unspecified: Secondary | ICD-10-CM | POA: Diagnosis not present

## 2021-01-11 DIAGNOSIS — W19XXXA Unspecified fall, initial encounter: Secondary | ICD-10-CM | POA: Diagnosis not present

## 2021-01-11 DIAGNOSIS — Z794 Long term (current) use of insulin: Secondary | ICD-10-CM | POA: Diagnosis not present

## 2021-01-11 DIAGNOSIS — R410 Disorientation, unspecified: Secondary | ICD-10-CM | POA: Diagnosis not present

## 2021-01-11 DIAGNOSIS — K862 Cyst of pancreas: Secondary | ICD-10-CM | POA: Diagnosis present

## 2021-01-11 DIAGNOSIS — F331 Major depressive disorder, recurrent, moderate: Secondary | ICD-10-CM | POA: Diagnosis not present

## 2021-01-11 DIAGNOSIS — Z743 Need for continuous supervision: Secondary | ICD-10-CM | POA: Diagnosis not present

## 2021-01-11 DIAGNOSIS — Y999 Unspecified external cause status: Secondary | ICD-10-CM | POA: Diagnosis not present

## 2021-01-11 DIAGNOSIS — R2681 Unsteadiness on feet: Secondary | ICD-10-CM | POA: Diagnosis not present

## 2021-01-11 DIAGNOSIS — R488 Other symbolic dysfunctions: Secondary | ICD-10-CM | POA: Diagnosis not present

## 2021-01-11 DIAGNOSIS — N281 Cyst of kidney, acquired: Secondary | ICD-10-CM | POA: Diagnosis present

## 2021-01-11 DIAGNOSIS — K8689 Other specified diseases of pancreas: Secondary | ICD-10-CM | POA: Diagnosis present

## 2021-01-11 DIAGNOSIS — K838 Other specified diseases of biliary tract: Secondary | ICD-10-CM | POA: Diagnosis present

## 2021-01-11 DIAGNOSIS — R110 Nausea: Secondary | ICD-10-CM | POA: Diagnosis not present

## 2021-01-11 DIAGNOSIS — E1122 Type 2 diabetes mellitus with diabetic chronic kidney disease: Secondary | ICD-10-CM | POA: Diagnosis not present

## 2021-01-11 DIAGNOSIS — E1165 Type 2 diabetes mellitus with hyperglycemia: Secondary | ICD-10-CM | POA: Diagnosis present

## 2021-01-11 DIAGNOSIS — M5032 Other cervical disc degeneration, mid-cervical region, unspecified level: Secondary | ICD-10-CM | POA: Diagnosis not present

## 2021-01-11 DIAGNOSIS — Z681 Body mass index (BMI) 19 or less, adult: Secondary | ICD-10-CM | POA: Diagnosis not present

## 2021-01-11 DIAGNOSIS — M25512 Pain in left shoulder: Secondary | ICD-10-CM | POA: Diagnosis not present

## 2021-01-11 DIAGNOSIS — E43 Unspecified severe protein-calorie malnutrition: Secondary | ICD-10-CM | POA: Diagnosis present

## 2021-01-11 DIAGNOSIS — R0902 Hypoxemia: Secondary | ICD-10-CM | POA: Diagnosis not present

## 2021-01-11 DIAGNOSIS — Z043 Encounter for examination and observation following other accident: Secondary | ICD-10-CM | POA: Diagnosis not present

## 2021-01-11 DIAGNOSIS — R296 Repeated falls: Secondary | ICD-10-CM | POA: Diagnosis not present

## 2021-01-11 DIAGNOSIS — R531 Weakness: Secondary | ICD-10-CM | POA: Diagnosis not present

## 2021-01-11 DIAGNOSIS — S42202D Unspecified fracture of upper end of left humerus, subsequent encounter for fracture with routine healing: Secondary | ICD-10-CM | POA: Diagnosis not present

## 2021-01-11 DIAGNOSIS — E860 Dehydration: Secondary | ICD-10-CM | POA: Diagnosis present

## 2021-01-11 DIAGNOSIS — S42302A Unspecified fracture of shaft of humerus, left arm, initial encounter for closed fracture: Secondary | ICD-10-CM | POA: Diagnosis present

## 2021-01-11 DIAGNOSIS — G9341 Metabolic encephalopathy: Secondary | ICD-10-CM | POA: Diagnosis not present

## 2021-01-11 DIAGNOSIS — R262 Difficulty in walking, not elsewhere classified: Secondary | ICD-10-CM | POA: Diagnosis not present

## 2021-01-11 DIAGNOSIS — E119 Type 2 diabetes mellitus without complications: Secondary | ICD-10-CM | POA: Diagnosis not present

## 2021-01-11 DIAGNOSIS — R1312 Dysphagia, oropharyngeal phase: Secondary | ICD-10-CM | POA: Diagnosis not present

## 2021-01-11 DIAGNOSIS — R5381 Other malaise: Secondary | ICD-10-CM | POA: Diagnosis not present

## 2021-01-11 DIAGNOSIS — N3000 Acute cystitis without hematuria: Secondary | ICD-10-CM | POA: Diagnosis not present

## 2021-01-11 DIAGNOSIS — W0110XA Fall on same level from slipping, tripping and stumbling with subsequent striking against unspecified object, initial encounter: Secondary | ICD-10-CM | POA: Diagnosis not present

## 2021-01-11 DIAGNOSIS — N390 Urinary tract infection, site not specified: Secondary | ICD-10-CM | POA: Diagnosis not present

## 2021-01-11 DIAGNOSIS — E46 Unspecified protein-calorie malnutrition: Secondary | ICD-10-CM | POA: Diagnosis not present

## 2021-01-12 DIAGNOSIS — N183 Chronic kidney disease, stage 3 unspecified: Secondary | ICD-10-CM | POA: Diagnosis not present

## 2021-01-12 DIAGNOSIS — K219 Gastro-esophageal reflux disease without esophagitis: Secondary | ICD-10-CM | POA: Diagnosis not present

## 2021-01-12 DIAGNOSIS — E1122 Type 2 diabetes mellitus with diabetic chronic kidney disease: Secondary | ICD-10-CM | POA: Diagnosis not present

## 2021-01-12 DIAGNOSIS — N390 Urinary tract infection, site not specified: Secondary | ICD-10-CM | POA: Diagnosis not present

## 2021-01-12 DIAGNOSIS — E785 Hyperlipidemia, unspecified: Secondary | ICD-10-CM | POA: Diagnosis not present

## 2021-01-12 DIAGNOSIS — F331 Major depressive disorder, recurrent, moderate: Secondary | ICD-10-CM | POA: Diagnosis not present

## 2021-01-14 DIAGNOSIS — E1122 Type 2 diabetes mellitus with diabetic chronic kidney disease: Secondary | ICD-10-CM | POA: Diagnosis not present

## 2021-01-14 DIAGNOSIS — N183 Chronic kidney disease, stage 3 unspecified: Secondary | ICD-10-CM | POA: Diagnosis not present

## 2021-01-14 DIAGNOSIS — K219 Gastro-esophageal reflux disease without esophagitis: Secondary | ICD-10-CM | POA: Diagnosis not present

## 2021-01-14 DIAGNOSIS — E785 Hyperlipidemia, unspecified: Secondary | ICD-10-CM | POA: Diagnosis not present

## 2021-01-14 DIAGNOSIS — N390 Urinary tract infection, site not specified: Secondary | ICD-10-CM | POA: Diagnosis not present

## 2021-01-15 DIAGNOSIS — Z20822 Contact with and (suspected) exposure to covid-19: Secondary | ICD-10-CM | POA: Diagnosis present

## 2021-01-15 DIAGNOSIS — R296 Repeated falls: Secondary | ICD-10-CM | POA: Diagnosis not present

## 2021-01-15 DIAGNOSIS — G9341 Metabolic encephalopathy: Secondary | ICD-10-CM | POA: Diagnosis not present

## 2021-01-15 DIAGNOSIS — R279 Unspecified lack of coordination: Secondary | ICD-10-CM | POA: Diagnosis not present

## 2021-01-15 DIAGNOSIS — N390 Urinary tract infection, site not specified: Secondary | ICD-10-CM | POA: Diagnosis not present

## 2021-01-15 DIAGNOSIS — I10 Essential (primary) hypertension: Secondary | ICD-10-CM | POA: Diagnosis present

## 2021-01-15 DIAGNOSIS — R945 Abnormal results of liver function studies: Secondary | ICD-10-CM | POA: Diagnosis not present

## 2021-01-15 DIAGNOSIS — M5032 Other cervical disc degeneration, mid-cervical region, unspecified level: Secondary | ICD-10-CM | POA: Diagnosis not present

## 2021-01-15 DIAGNOSIS — F0390 Unspecified dementia without behavioral disturbance: Secondary | ICD-10-CM | POA: Diagnosis not present

## 2021-01-15 DIAGNOSIS — R262 Difficulty in walking, not elsewhere classified: Secondary | ICD-10-CM | POA: Diagnosis not present

## 2021-01-15 DIAGNOSIS — Z794 Long term (current) use of insulin: Secondary | ICD-10-CM | POA: Diagnosis not present

## 2021-01-15 DIAGNOSIS — R0902 Hypoxemia: Secondary | ICD-10-CM | POA: Diagnosis not present

## 2021-01-15 DIAGNOSIS — M6281 Muscle weakness (generalized): Secondary | ICD-10-CM | POA: Diagnosis not present

## 2021-01-15 DIAGNOSIS — E46 Unspecified protein-calorie malnutrition: Secondary | ICD-10-CM | POA: Diagnosis not present

## 2021-01-15 DIAGNOSIS — R2681 Unsteadiness on feet: Secondary | ICD-10-CM | POA: Diagnosis not present

## 2021-01-15 DIAGNOSIS — S42202D Unspecified fracture of upper end of left humerus, subsequent encounter for fracture with routine healing: Secondary | ICD-10-CM | POA: Diagnosis not present

## 2021-01-15 DIAGNOSIS — Z681 Body mass index (BMI) 19 or less, adult: Secondary | ICD-10-CM | POA: Diagnosis not present

## 2021-01-15 DIAGNOSIS — R7401 Elevation of levels of liver transaminase levels: Secondary | ICD-10-CM | POA: Diagnosis not present

## 2021-01-15 DIAGNOSIS — R531 Weakness: Secondary | ICD-10-CM | POA: Diagnosis not present

## 2021-01-15 DIAGNOSIS — R7989 Other specified abnormal findings of blood chemistry: Secondary | ICD-10-CM | POA: Diagnosis not present

## 2021-01-15 DIAGNOSIS — N183 Chronic kidney disease, stage 3 unspecified: Secondary | ICD-10-CM | POA: Diagnosis not present

## 2021-01-15 DIAGNOSIS — W19XXXA Unspecified fall, initial encounter: Secondary | ICD-10-CM | POA: Diagnosis not present

## 2021-01-15 DIAGNOSIS — M1380 Other specified arthritis, unspecified site: Secondary | ICD-10-CM | POA: Diagnosis not present

## 2021-01-15 DIAGNOSIS — S42292D Other displaced fracture of upper end of left humerus, subsequent encounter for fracture with routine healing: Secondary | ICD-10-CM | POA: Diagnosis not present

## 2021-01-15 DIAGNOSIS — Z741 Need for assistance with personal care: Secondary | ICD-10-CM | POA: Diagnosis not present

## 2021-01-15 DIAGNOSIS — K838 Other specified diseases of biliary tract: Secondary | ICD-10-CM | POA: Diagnosis present

## 2021-01-15 DIAGNOSIS — R1312 Dysphagia, oropharyngeal phase: Secondary | ICD-10-CM | POA: Diagnosis not present

## 2021-01-15 DIAGNOSIS — R488 Other symbolic dysfunctions: Secondary | ICD-10-CM | POA: Diagnosis not present

## 2021-01-15 DIAGNOSIS — Z743 Need for continuous supervision: Secondary | ICD-10-CM | POA: Diagnosis not present

## 2021-01-15 DIAGNOSIS — R739 Hyperglycemia, unspecified: Secondary | ICD-10-CM | POA: Diagnosis not present

## 2021-01-15 DIAGNOSIS — E1165 Type 2 diabetes mellitus with hyperglycemia: Secondary | ICD-10-CM | POA: Insufficient documentation

## 2021-01-15 DIAGNOSIS — R5381 Other malaise: Secondary | ICD-10-CM | POA: Diagnosis not present

## 2021-01-15 DIAGNOSIS — D649 Anemia, unspecified: Secondary | ICD-10-CM | POA: Diagnosis not present

## 2021-01-15 DIAGNOSIS — F331 Major depressive disorder, recurrent, moderate: Secondary | ICD-10-CM | POA: Diagnosis not present

## 2021-01-15 DIAGNOSIS — R41841 Cognitive communication deficit: Secondary | ICD-10-CM | POA: Diagnosis not present

## 2021-01-15 DIAGNOSIS — S42302A Unspecified fracture of shaft of humerus, left arm, initial encounter for closed fracture: Secondary | ICD-10-CM | POA: Diagnosis present

## 2021-01-15 DIAGNOSIS — R935 Abnormal findings on diagnostic imaging of other abdominal regions, including retroperitoneum: Secondary | ICD-10-CM | POA: Diagnosis not present

## 2021-01-15 DIAGNOSIS — R932 Abnormal findings on diagnostic imaging of liver and biliary tract: Secondary | ICD-10-CM | POA: Diagnosis not present

## 2021-01-15 DIAGNOSIS — M25512 Pain in left shoulder: Secondary | ICD-10-CM | POA: Diagnosis not present

## 2021-01-15 DIAGNOSIS — E1100 Type 2 diabetes mellitus with hyperosmolarity without nonketotic hyperglycemic-hyperosmolar coma (NKHHC): Secondary | ICD-10-CM | POA: Diagnosis not present

## 2021-01-15 DIAGNOSIS — F339 Major depressive disorder, recurrent, unspecified: Secondary | ICD-10-CM | POA: Diagnosis not present

## 2021-01-15 DIAGNOSIS — E119 Type 2 diabetes mellitus without complications: Secondary | ICD-10-CM | POA: Diagnosis not present

## 2021-01-15 DIAGNOSIS — E43 Unspecified severe protein-calorie malnutrition: Secondary | ICD-10-CM | POA: Diagnosis present

## 2021-01-15 DIAGNOSIS — Y999 Unspecified external cause status: Secondary | ICD-10-CM | POA: Diagnosis not present

## 2021-01-15 DIAGNOSIS — K862 Cyst of pancreas: Secondary | ICD-10-CM | POA: Diagnosis present

## 2021-01-15 DIAGNOSIS — W0110XA Fall on same level from slipping, tripping and stumbling with subsequent striking against unspecified object, initial encounter: Secondary | ICD-10-CM | POA: Diagnosis not present

## 2021-01-15 DIAGNOSIS — N281 Cyst of kidney, acquired: Secondary | ICD-10-CM | POA: Diagnosis present

## 2021-01-15 DIAGNOSIS — E860 Dehydration: Secondary | ICD-10-CM | POA: Diagnosis present

## 2021-01-15 DIAGNOSIS — K8689 Other specified diseases of pancreas: Secondary | ICD-10-CM | POA: Diagnosis present

## 2021-01-15 DIAGNOSIS — J90 Pleural effusion, not elsewhere classified: Secondary | ICD-10-CM | POA: Diagnosis not present

## 2021-01-15 DIAGNOSIS — R110 Nausea: Secondary | ICD-10-CM | POA: Diagnosis not present

## 2021-01-15 DIAGNOSIS — E785 Hyperlipidemia, unspecified: Secondary | ICD-10-CM | POA: Diagnosis not present

## 2021-01-15 DIAGNOSIS — Z043 Encounter for examination and observation following other accident: Secondary | ICD-10-CM | POA: Diagnosis not present

## 2021-01-19 DIAGNOSIS — N183 Chronic kidney disease, stage 3 unspecified: Secondary | ICD-10-CM | POA: Diagnosis not present

## 2021-01-19 DIAGNOSIS — R41841 Cognitive communication deficit: Secondary | ICD-10-CM | POA: Diagnosis not present

## 2021-01-19 DIAGNOSIS — R2681 Unsteadiness on feet: Secondary | ICD-10-CM | POA: Diagnosis not present

## 2021-01-19 DIAGNOSIS — G9341 Metabolic encephalopathy: Secondary | ICD-10-CM | POA: Diagnosis not present

## 2021-01-19 DIAGNOSIS — R279 Unspecified lack of coordination: Secondary | ICD-10-CM | POA: Diagnosis not present

## 2021-01-19 DIAGNOSIS — R739 Hyperglycemia, unspecified: Secondary | ICD-10-CM | POA: Insufficient documentation

## 2021-01-19 DIAGNOSIS — M80022D Age-related osteoporosis with current pathological fracture, left humerus, subsequent encounter for fracture with routine healing: Secondary | ICD-10-CM | POA: Diagnosis not present

## 2021-01-19 DIAGNOSIS — K862 Cyst of pancreas: Secondary | ICD-10-CM | POA: Diagnosis not present

## 2021-01-19 DIAGNOSIS — Z794 Long term (current) use of insulin: Secondary | ICD-10-CM | POA: Diagnosis not present

## 2021-01-19 DIAGNOSIS — R7401 Elevation of levels of liver transaminase levels: Secondary | ICD-10-CM | POA: Insufficient documentation

## 2021-01-19 DIAGNOSIS — I10 Essential (primary) hypertension: Secondary | ICD-10-CM | POA: Diagnosis not present

## 2021-01-19 DIAGNOSIS — Z9181 History of falling: Secondary | ICD-10-CM | POA: Diagnosis not present

## 2021-01-19 DIAGNOSIS — D631 Anemia in chronic kidney disease: Secondary | ICD-10-CM | POA: Diagnosis not present

## 2021-01-19 DIAGNOSIS — K838 Other specified diseases of biliary tract: Secondary | ICD-10-CM | POA: Diagnosis not present

## 2021-01-19 DIAGNOSIS — E46 Unspecified protein-calorie malnutrition: Secondary | ICD-10-CM | POA: Diagnosis not present

## 2021-01-19 DIAGNOSIS — S42202D Unspecified fracture of upper end of left humerus, subsequent encounter for fracture with routine healing: Secondary | ICD-10-CM | POA: Diagnosis not present

## 2021-01-19 DIAGNOSIS — F331 Major depressive disorder, recurrent, moderate: Secondary | ICD-10-CM | POA: Diagnosis not present

## 2021-01-19 DIAGNOSIS — R110 Nausea: Secondary | ICD-10-CM | POA: Diagnosis not present

## 2021-01-19 DIAGNOSIS — R195 Other fecal abnormalities: Secondary | ICD-10-CM | POA: Diagnosis not present

## 2021-01-19 DIAGNOSIS — K219 Gastro-esophageal reflux disease without esophagitis: Secondary | ICD-10-CM | POA: Diagnosis not present

## 2021-01-19 DIAGNOSIS — E785 Hyperlipidemia, unspecified: Secondary | ICD-10-CM | POA: Diagnosis not present

## 2021-01-19 DIAGNOSIS — Z743 Need for continuous supervision: Secondary | ICD-10-CM | POA: Diagnosis not present

## 2021-01-19 DIAGNOSIS — S42202A Unspecified fracture of upper end of left humerus, initial encounter for closed fracture: Secondary | ICD-10-CM | POA: Diagnosis not present

## 2021-01-19 DIAGNOSIS — M25512 Pain in left shoulder: Secondary | ICD-10-CM | POA: Diagnosis not present

## 2021-01-19 DIAGNOSIS — K831 Obstruction of bile duct: Secondary | ICD-10-CM | POA: Diagnosis not present

## 2021-01-19 DIAGNOSIS — R296 Repeated falls: Secondary | ICD-10-CM | POA: Diagnosis not present

## 2021-01-19 DIAGNOSIS — E119 Type 2 diabetes mellitus without complications: Secondary | ICD-10-CM | POA: Diagnosis not present

## 2021-01-19 DIAGNOSIS — E1122 Type 2 diabetes mellitus with diabetic chronic kidney disease: Secondary | ICD-10-CM | POA: Diagnosis not present

## 2021-01-19 DIAGNOSIS — R945 Abnormal results of liver function studies: Secondary | ICD-10-CM | POA: Diagnosis not present

## 2021-01-19 DIAGNOSIS — D649 Anemia, unspecified: Secondary | ICD-10-CM | POA: Diagnosis not present

## 2021-01-19 DIAGNOSIS — R1312 Dysphagia, oropharyngeal phase: Secondary | ICD-10-CM | POA: Diagnosis not present

## 2021-01-19 DIAGNOSIS — E7800 Pure hypercholesterolemia, unspecified: Secondary | ICD-10-CM | POA: Diagnosis not present

## 2021-01-19 DIAGNOSIS — F339 Major depressive disorder, recurrent, unspecified: Secondary | ICD-10-CM | POA: Diagnosis not present

## 2021-01-19 DIAGNOSIS — M6281 Muscle weakness (generalized): Secondary | ICD-10-CM | POA: Diagnosis not present

## 2021-01-19 DIAGNOSIS — R262 Difficulty in walking, not elsewhere classified: Secondary | ICD-10-CM | POA: Diagnosis not present

## 2021-01-19 DIAGNOSIS — R488 Other symbolic dysfunctions: Secondary | ICD-10-CM | POA: Diagnosis not present

## 2021-01-19 DIAGNOSIS — S42292D Other displaced fracture of upper end of left humerus, subsequent encounter for fracture with routine healing: Secondary | ICD-10-CM | POA: Diagnosis not present

## 2021-01-19 DIAGNOSIS — F0390 Unspecified dementia without behavioral disturbance: Secondary | ICD-10-CM | POA: Diagnosis not present

## 2021-01-19 DIAGNOSIS — I129 Hypertensive chronic kidney disease with stage 1 through stage 4 chronic kidney disease, or unspecified chronic kidney disease: Secondary | ICD-10-CM | POA: Diagnosis not present

## 2021-01-19 DIAGNOSIS — E1165 Type 2 diabetes mellitus with hyperglycemia: Secondary | ICD-10-CM | POA: Diagnosis not present

## 2021-01-19 DIAGNOSIS — M1380 Other specified arthritis, unspecified site: Secondary | ICD-10-CM | POA: Diagnosis not present

## 2021-01-19 DIAGNOSIS — Z741 Need for assistance with personal care: Secondary | ICD-10-CM | POA: Diagnosis not present

## 2021-01-19 DIAGNOSIS — R531 Weakness: Secondary | ICD-10-CM | POA: Diagnosis not present

## 2021-01-24 DIAGNOSIS — N183 Chronic kidney disease, stage 3 unspecified: Secondary | ICD-10-CM | POA: Diagnosis not present

## 2021-01-24 DIAGNOSIS — E1122 Type 2 diabetes mellitus with diabetic chronic kidney disease: Secondary | ICD-10-CM | POA: Diagnosis not present

## 2021-01-24 DIAGNOSIS — K219 Gastro-esophageal reflux disease without esophagitis: Secondary | ICD-10-CM | POA: Diagnosis not present

## 2021-01-24 DIAGNOSIS — F331 Major depressive disorder, recurrent, moderate: Secondary | ICD-10-CM | POA: Diagnosis not present

## 2021-01-26 DIAGNOSIS — K862 Cyst of pancreas: Secondary | ICD-10-CM | POA: Diagnosis not present

## 2021-01-26 DIAGNOSIS — E1165 Type 2 diabetes mellitus with hyperglycemia: Secondary | ICD-10-CM | POA: Diagnosis not present

## 2021-01-26 DIAGNOSIS — R7401 Elevation of levels of liver transaminase levels: Secondary | ICD-10-CM | POA: Diagnosis not present

## 2021-01-26 DIAGNOSIS — K831 Obstruction of bile duct: Secondary | ICD-10-CM | POA: Diagnosis not present

## 2021-01-30 DIAGNOSIS — S42202A Unspecified fracture of upper end of left humerus, initial encounter for closed fracture: Secondary | ICD-10-CM | POA: Diagnosis not present

## 2021-01-31 DIAGNOSIS — K219 Gastro-esophageal reflux disease without esophagitis: Secondary | ICD-10-CM | POA: Diagnosis not present

## 2021-01-31 DIAGNOSIS — F331 Major depressive disorder, recurrent, moderate: Secondary | ICD-10-CM | POA: Diagnosis not present

## 2021-01-31 DIAGNOSIS — R296 Repeated falls: Secondary | ICD-10-CM | POA: Diagnosis not present

## 2021-01-31 DIAGNOSIS — E1122 Type 2 diabetes mellitus with diabetic chronic kidney disease: Secondary | ICD-10-CM | POA: Diagnosis not present

## 2021-02-03 DIAGNOSIS — F0390 Unspecified dementia without behavioral disturbance: Secondary | ICD-10-CM

## 2021-02-03 DIAGNOSIS — M80022D Age-related osteoporosis with current pathological fracture, left humerus, subsequent encounter for fracture with routine healing: Secondary | ICD-10-CM

## 2021-02-03 DIAGNOSIS — Z9181 History of falling: Secondary | ICD-10-CM

## 2021-02-03 DIAGNOSIS — D631 Anemia in chronic kidney disease: Secondary | ICD-10-CM

## 2021-02-03 DIAGNOSIS — E7800 Pure hypercholesterolemia, unspecified: Secondary | ICD-10-CM

## 2021-02-03 DIAGNOSIS — I129 Hypertensive chronic kidney disease with stage 1 through stage 4 chronic kidney disease, or unspecified chronic kidney disease: Secondary | ICD-10-CM

## 2021-02-03 DIAGNOSIS — E1165 Type 2 diabetes mellitus with hyperglycemia: Secondary | ICD-10-CM

## 2021-02-03 DIAGNOSIS — N183 Chronic kidney disease, stage 3 unspecified: Secondary | ICD-10-CM

## 2021-02-03 DIAGNOSIS — F339 Major depressive disorder, recurrent, unspecified: Secondary | ICD-10-CM

## 2021-02-03 DIAGNOSIS — Z794 Long term (current) use of insulin: Secondary | ICD-10-CM

## 2021-02-03 DIAGNOSIS — E1122 Type 2 diabetes mellitus with diabetic chronic kidney disease: Secondary | ICD-10-CM

## 2021-02-03 DIAGNOSIS — E46 Unspecified protein-calorie malnutrition: Secondary | ICD-10-CM

## 2021-02-07 DIAGNOSIS — E1122 Type 2 diabetes mellitus with diabetic chronic kidney disease: Secondary | ICD-10-CM | POA: Diagnosis not present

## 2021-02-07 DIAGNOSIS — F331 Major depressive disorder, recurrent, moderate: Secondary | ICD-10-CM | POA: Diagnosis not present

## 2021-02-07 DIAGNOSIS — N183 Chronic kidney disease, stage 3 unspecified: Secondary | ICD-10-CM | POA: Diagnosis not present

## 2021-02-07 DIAGNOSIS — K219 Gastro-esophageal reflux disease without esophagitis: Secondary | ICD-10-CM | POA: Diagnosis not present

## 2021-02-10 DIAGNOSIS — M80022D Age-related osteoporosis with current pathological fracture, left humerus, subsequent encounter for fracture with routine healing: Secondary | ICD-10-CM

## 2021-02-10 DIAGNOSIS — E7800 Pure hypercholesterolemia, unspecified: Secondary | ICD-10-CM

## 2021-02-10 DIAGNOSIS — N183 Chronic kidney disease, stage 3 unspecified: Secondary | ICD-10-CM

## 2021-02-10 DIAGNOSIS — F0390 Unspecified dementia without behavioral disturbance: Secondary | ICD-10-CM

## 2021-02-10 DIAGNOSIS — E46 Unspecified protein-calorie malnutrition: Secondary | ICD-10-CM

## 2021-02-10 DIAGNOSIS — F339 Major depressive disorder, recurrent, unspecified: Secondary | ICD-10-CM

## 2021-02-10 DIAGNOSIS — D631 Anemia in chronic kidney disease: Secondary | ICD-10-CM

## 2021-02-10 DIAGNOSIS — Z794 Long term (current) use of insulin: Secondary | ICD-10-CM

## 2021-02-10 DIAGNOSIS — Z9181 History of falling: Secondary | ICD-10-CM

## 2021-02-10 DIAGNOSIS — E1122 Type 2 diabetes mellitus with diabetic chronic kidney disease: Secondary | ICD-10-CM

## 2021-02-10 DIAGNOSIS — I129 Hypertensive chronic kidney disease with stage 1 through stage 4 chronic kidney disease, or unspecified chronic kidney disease: Secondary | ICD-10-CM

## 2021-02-10 DIAGNOSIS — E1165 Type 2 diabetes mellitus with hyperglycemia: Secondary | ICD-10-CM

## 2021-02-14 DIAGNOSIS — E1122 Type 2 diabetes mellitus with diabetic chronic kidney disease: Secondary | ICD-10-CM | POA: Diagnosis not present

## 2021-02-14 DIAGNOSIS — K219 Gastro-esophageal reflux disease without esophagitis: Secondary | ICD-10-CM | POA: Diagnosis not present

## 2021-02-14 DIAGNOSIS — F331 Major depressive disorder, recurrent, moderate: Secondary | ICD-10-CM | POA: Diagnosis not present

## 2021-02-14 DIAGNOSIS — N183 Chronic kidney disease, stage 3 unspecified: Secondary | ICD-10-CM | POA: Diagnosis not present

## 2021-02-14 DIAGNOSIS — R296 Repeated falls: Secondary | ICD-10-CM | POA: Diagnosis not present

## 2021-02-22 DIAGNOSIS — G9341 Metabolic encephalopathy: Secondary | ICD-10-CM | POA: Diagnosis not present

## 2021-02-22 DIAGNOSIS — R279 Unspecified lack of coordination: Secondary | ICD-10-CM | POA: Diagnosis not present

## 2021-02-22 DIAGNOSIS — R1312 Dysphagia, oropharyngeal phase: Secondary | ICD-10-CM | POA: Diagnosis not present

## 2021-02-22 DIAGNOSIS — Z741 Need for assistance with personal care: Secondary | ICD-10-CM | POA: Diagnosis not present

## 2021-02-22 DIAGNOSIS — M6281 Muscle weakness (generalized): Secondary | ICD-10-CM | POA: Diagnosis not present

## 2021-02-22 DIAGNOSIS — E1165 Type 2 diabetes mellitus with hyperglycemia: Secondary | ICD-10-CM | POA: Diagnosis not present

## 2021-02-22 DIAGNOSIS — R488 Other symbolic dysfunctions: Secondary | ICD-10-CM | POA: Diagnosis not present

## 2021-02-22 DIAGNOSIS — E785 Hyperlipidemia, unspecified: Secondary | ICD-10-CM | POA: Diagnosis not present

## 2021-02-22 DIAGNOSIS — R41841 Cognitive communication deficit: Secondary | ICD-10-CM | POA: Diagnosis not present

## 2021-02-22 DIAGNOSIS — D649 Anemia, unspecified: Secondary | ICD-10-CM | POA: Diagnosis not present

## 2021-02-22 DIAGNOSIS — F0390 Unspecified dementia without behavioral disturbance: Secondary | ICD-10-CM | POA: Diagnosis not present

## 2021-02-22 DIAGNOSIS — U071 COVID-19: Secondary | ICD-10-CM | POA: Diagnosis not present

## 2021-02-22 DIAGNOSIS — R7401 Elevation of levels of liver transaminase levels: Secondary | ICD-10-CM | POA: Diagnosis not present

## 2021-02-22 DIAGNOSIS — M1380 Other specified arthritis, unspecified site: Secondary | ICD-10-CM | POA: Diagnosis not present

## 2021-02-22 DIAGNOSIS — R296 Repeated falls: Secondary | ICD-10-CM | POA: Diagnosis not present

## 2021-02-22 DIAGNOSIS — R110 Nausea: Secondary | ICD-10-CM | POA: Diagnosis not present

## 2021-02-22 DIAGNOSIS — E119 Type 2 diabetes mellitus without complications: Secondary | ICD-10-CM | POA: Diagnosis not present

## 2021-02-22 DIAGNOSIS — S42202D Unspecified fracture of upper end of left humerus, subsequent encounter for fracture with routine healing: Secondary | ICD-10-CM | POA: Diagnosis not present

## 2021-02-22 DIAGNOSIS — R262 Difficulty in walking, not elsewhere classified: Secondary | ICD-10-CM | POA: Diagnosis not present

## 2021-02-22 DIAGNOSIS — S42292D Other displaced fracture of upper end of left humerus, subsequent encounter for fracture with routine healing: Secondary | ICD-10-CM | POA: Diagnosis not present

## 2021-02-22 DIAGNOSIS — F339 Major depressive disorder, recurrent, unspecified: Secondary | ICD-10-CM | POA: Diagnosis not present

## 2021-02-22 DIAGNOSIS — M25512 Pain in left shoulder: Secondary | ICD-10-CM | POA: Diagnosis not present

## 2021-02-22 DIAGNOSIS — R739 Hyperglycemia, unspecified: Secondary | ICD-10-CM | POA: Diagnosis not present

## 2021-02-22 DIAGNOSIS — R2681 Unsteadiness on feet: Secondary | ICD-10-CM | POA: Diagnosis not present

## 2021-02-22 DIAGNOSIS — I10 Essential (primary) hypertension: Secondary | ICD-10-CM | POA: Diagnosis not present

## 2021-02-27 DIAGNOSIS — S42202D Unspecified fracture of upper end of left humerus, subsequent encounter for fracture with routine healing: Secondary | ICD-10-CM | POA: Diagnosis not present

## 2021-02-27 DIAGNOSIS — F0390 Unspecified dementia without behavioral disturbance: Secondary | ICD-10-CM | POA: Diagnosis not present

## 2021-02-27 DIAGNOSIS — R296 Repeated falls: Secondary | ICD-10-CM | POA: Diagnosis not present

## 2021-02-27 DIAGNOSIS — E1165 Type 2 diabetes mellitus with hyperglycemia: Secondary | ICD-10-CM | POA: Diagnosis not present

## 2021-02-27 DIAGNOSIS — Z741 Need for assistance with personal care: Secondary | ICD-10-CM | POA: Diagnosis not present

## 2021-02-27 DIAGNOSIS — S42292D Other displaced fracture of upper end of left humerus, subsequent encounter for fracture with routine healing: Secondary | ICD-10-CM | POA: Diagnosis not present

## 2021-02-28 DIAGNOSIS — S42292D Other displaced fracture of upper end of left humerus, subsequent encounter for fracture with routine healing: Secondary | ICD-10-CM | POA: Diagnosis not present

## 2021-02-28 DIAGNOSIS — Z741 Need for assistance with personal care: Secondary | ICD-10-CM | POA: Diagnosis not present

## 2021-02-28 DIAGNOSIS — F0390 Unspecified dementia without behavioral disturbance: Secondary | ICD-10-CM | POA: Diagnosis not present

## 2021-02-28 DIAGNOSIS — E1165 Type 2 diabetes mellitus with hyperglycemia: Secondary | ICD-10-CM | POA: Diagnosis not present

## 2021-02-28 DIAGNOSIS — R296 Repeated falls: Secondary | ICD-10-CM | POA: Diagnosis not present

## 2021-02-28 DIAGNOSIS — S42202D Unspecified fracture of upper end of left humerus, subsequent encounter for fracture with routine healing: Secondary | ICD-10-CM | POA: Diagnosis not present

## 2021-03-01 DIAGNOSIS — S42202D Unspecified fracture of upper end of left humerus, subsequent encounter for fracture with routine healing: Secondary | ICD-10-CM | POA: Diagnosis not present

## 2021-03-01 DIAGNOSIS — E1165 Type 2 diabetes mellitus with hyperglycemia: Secondary | ICD-10-CM | POA: Diagnosis not present

## 2021-03-01 DIAGNOSIS — S42292D Other displaced fracture of upper end of left humerus, subsequent encounter for fracture with routine healing: Secondary | ICD-10-CM | POA: Diagnosis not present

## 2021-03-01 DIAGNOSIS — F0390 Unspecified dementia without behavioral disturbance: Secondary | ICD-10-CM | POA: Diagnosis not present

## 2021-03-01 DIAGNOSIS — Z741 Need for assistance with personal care: Secondary | ICD-10-CM | POA: Diagnosis not present

## 2021-03-01 DIAGNOSIS — R296 Repeated falls: Secondary | ICD-10-CM | POA: Diagnosis not present

## 2021-03-02 DIAGNOSIS — Z741 Need for assistance with personal care: Secondary | ICD-10-CM | POA: Diagnosis not present

## 2021-03-02 DIAGNOSIS — E1165 Type 2 diabetes mellitus with hyperglycemia: Secondary | ICD-10-CM | POA: Diagnosis not present

## 2021-03-02 DIAGNOSIS — R296 Repeated falls: Secondary | ICD-10-CM | POA: Diagnosis not present

## 2021-03-02 DIAGNOSIS — S42202D Unspecified fracture of upper end of left humerus, subsequent encounter for fracture with routine healing: Secondary | ICD-10-CM | POA: Diagnosis not present

## 2021-03-02 DIAGNOSIS — S42292D Other displaced fracture of upper end of left humerus, subsequent encounter for fracture with routine healing: Secondary | ICD-10-CM | POA: Diagnosis not present

## 2021-03-02 DIAGNOSIS — F0390 Unspecified dementia without behavioral disturbance: Secondary | ICD-10-CM | POA: Diagnosis not present

## 2021-03-03 DIAGNOSIS — E1165 Type 2 diabetes mellitus with hyperglycemia: Secondary | ICD-10-CM | POA: Diagnosis not present

## 2021-03-03 DIAGNOSIS — R296 Repeated falls: Secondary | ICD-10-CM | POA: Diagnosis not present

## 2021-03-03 DIAGNOSIS — Z741 Need for assistance with personal care: Secondary | ICD-10-CM | POA: Diagnosis not present

## 2021-03-03 DIAGNOSIS — F0390 Unspecified dementia without behavioral disturbance: Secondary | ICD-10-CM | POA: Diagnosis not present

## 2021-03-03 DIAGNOSIS — S42202D Unspecified fracture of upper end of left humerus, subsequent encounter for fracture with routine healing: Secondary | ICD-10-CM | POA: Diagnosis not present

## 2021-03-03 DIAGNOSIS — S42292D Other displaced fracture of upper end of left humerus, subsequent encounter for fracture with routine healing: Secondary | ICD-10-CM | POA: Diagnosis not present

## 2021-03-06 DIAGNOSIS — F0390 Unspecified dementia without behavioral disturbance: Secondary | ICD-10-CM | POA: Diagnosis not present

## 2021-03-06 DIAGNOSIS — U071 COVID-19: Secondary | ICD-10-CM | POA: Diagnosis not present

## 2021-03-10 DIAGNOSIS — R296 Repeated falls: Secondary | ICD-10-CM | POA: Diagnosis not present

## 2021-03-10 DIAGNOSIS — S42202D Unspecified fracture of upper end of left humerus, subsequent encounter for fracture with routine healing: Secondary | ICD-10-CM | POA: Diagnosis not present

## 2021-03-10 DIAGNOSIS — E1165 Type 2 diabetes mellitus with hyperglycemia: Secondary | ICD-10-CM | POA: Diagnosis not present

## 2021-03-10 DIAGNOSIS — F0390 Unspecified dementia without behavioral disturbance: Secondary | ICD-10-CM | POA: Diagnosis not present

## 2021-03-10 DIAGNOSIS — Z741 Need for assistance with personal care: Secondary | ICD-10-CM | POA: Diagnosis not present

## 2021-03-10 DIAGNOSIS — S42292D Other displaced fracture of upper end of left humerus, subsequent encounter for fracture with routine healing: Secondary | ICD-10-CM | POA: Diagnosis not present

## 2021-03-14 DIAGNOSIS — F331 Major depressive disorder, recurrent, moderate: Secondary | ICD-10-CM | POA: Diagnosis not present

## 2021-03-14 DIAGNOSIS — F0390 Unspecified dementia without behavioral disturbance: Secondary | ICD-10-CM | POA: Diagnosis not present

## 2021-03-14 DIAGNOSIS — K219 Gastro-esophageal reflux disease without esophagitis: Secondary | ICD-10-CM | POA: Diagnosis not present

## 2021-03-14 DIAGNOSIS — U071 COVID-19: Secondary | ICD-10-CM | POA: Diagnosis not present

## 2021-03-15 DIAGNOSIS — S42202A Unspecified fracture of upper end of left humerus, initial encounter for closed fracture: Secondary | ICD-10-CM | POA: Diagnosis not present

## 2021-03-19 ENCOUNTER — Other Ambulatory Visit: Payer: Self-pay | Admitting: Legal Medicine

## 2021-03-22 DIAGNOSIS — F03A3 Unspecified dementia, mild, with mood disturbance: Secondary | ICD-10-CM | POA: Diagnosis not present

## 2021-03-22 DIAGNOSIS — F321 Major depressive disorder, single episode, moderate: Secondary | ICD-10-CM | POA: Diagnosis not present

## 2021-03-25 DIAGNOSIS — R296 Repeated falls: Secondary | ICD-10-CM | POA: Diagnosis not present

## 2021-03-28 DIAGNOSIS — N390 Urinary tract infection, site not specified: Secondary | ICD-10-CM | POA: Diagnosis not present

## 2021-03-29 DIAGNOSIS — N390 Urinary tract infection, site not specified: Secondary | ICD-10-CM | POA: Diagnosis not present

## 2021-03-31 DIAGNOSIS — E119 Type 2 diabetes mellitus without complications: Secondary | ICD-10-CM | POA: Diagnosis not present

## 2021-03-31 DIAGNOSIS — Z961 Presence of intraocular lens: Secondary | ICD-10-CM | POA: Diagnosis not present

## 2021-03-31 DIAGNOSIS — H524 Presbyopia: Secondary | ICD-10-CM | POA: Diagnosis not present

## 2021-04-06 DIAGNOSIS — R296 Repeated falls: Secondary | ICD-10-CM | POA: Diagnosis not present

## 2021-04-11 DIAGNOSIS — F331 Major depressive disorder, recurrent, moderate: Secondary | ICD-10-CM | POA: Diagnosis not present

## 2021-04-11 DIAGNOSIS — E785 Hyperlipidemia, unspecified: Secondary | ICD-10-CM | POA: Diagnosis not present

## 2021-04-11 DIAGNOSIS — E1122 Type 2 diabetes mellitus with diabetic chronic kidney disease: Secondary | ICD-10-CM | POA: Diagnosis not present

## 2021-04-11 DIAGNOSIS — K219 Gastro-esophageal reflux disease without esophagitis: Secondary | ICD-10-CM | POA: Diagnosis not present

## 2021-04-11 DIAGNOSIS — F0390 Unspecified dementia without behavioral disturbance: Secondary | ICD-10-CM | POA: Diagnosis not present

## 2021-04-11 DIAGNOSIS — N183 Chronic kidney disease, stage 3 unspecified: Secondary | ICD-10-CM | POA: Diagnosis not present

## 2021-04-28 ENCOUNTER — Ambulatory Visit: Payer: Medicare Other | Admitting: Legal Medicine

## 2021-05-09 DIAGNOSIS — N183 Chronic kidney disease, stage 3 unspecified: Secondary | ICD-10-CM | POA: Diagnosis not present

## 2021-05-09 DIAGNOSIS — K219 Gastro-esophageal reflux disease without esophagitis: Secondary | ICD-10-CM | POA: Diagnosis not present

## 2021-05-09 DIAGNOSIS — F0390 Unspecified dementia without behavioral disturbance: Secondary | ICD-10-CM | POA: Diagnosis not present

## 2021-05-09 DIAGNOSIS — F331 Major depressive disorder, recurrent, moderate: Secondary | ICD-10-CM | POA: Diagnosis not present

## 2021-05-09 DIAGNOSIS — E1122 Type 2 diabetes mellitus with diabetic chronic kidney disease: Secondary | ICD-10-CM | POA: Diagnosis not present

## 2021-05-10 DIAGNOSIS — D519 Vitamin B12 deficiency anemia, unspecified: Secondary | ICD-10-CM | POA: Diagnosis not present

## 2021-05-10 DIAGNOSIS — D529 Folate deficiency anemia, unspecified: Secondary | ICD-10-CM | POA: Diagnosis not present

## 2021-05-10 DIAGNOSIS — E559 Vitamin D deficiency, unspecified: Secondary | ICD-10-CM | POA: Diagnosis not present

## 2021-05-10 DIAGNOSIS — E119 Type 2 diabetes mellitus without complications: Secondary | ICD-10-CM | POA: Diagnosis not present

## 2021-05-23 DIAGNOSIS — F321 Major depressive disorder, single episode, moderate: Secondary | ICD-10-CM | POA: Diagnosis not present

## 2021-05-23 DIAGNOSIS — F03A3 Unspecified dementia, mild, with mood disturbance: Secondary | ICD-10-CM | POA: Diagnosis not present

## 2021-05-29 DIAGNOSIS — S42292D Other displaced fracture of upper end of left humerus, subsequent encounter for fracture with routine healing: Secondary | ICD-10-CM | POA: Diagnosis not present

## 2021-05-29 DIAGNOSIS — F321 Major depressive disorder, single episode, moderate: Secondary | ICD-10-CM | POA: Diagnosis not present

## 2021-05-29 DIAGNOSIS — K219 Gastro-esophageal reflux disease without esophagitis: Secondary | ICD-10-CM | POA: Diagnosis not present

## 2021-05-29 DIAGNOSIS — R279 Unspecified lack of coordination: Secondary | ICD-10-CM | POA: Diagnosis not present

## 2021-05-29 DIAGNOSIS — E119 Type 2 diabetes mellitus without complications: Secondary | ICD-10-CM | POA: Diagnosis not present

## 2021-05-29 DIAGNOSIS — F0390 Unspecified dementia without behavioral disturbance: Secondary | ICD-10-CM | POA: Diagnosis not present

## 2021-05-29 DIAGNOSIS — R41841 Cognitive communication deficit: Secondary | ICD-10-CM | POA: Diagnosis not present

## 2021-05-29 DIAGNOSIS — R739 Hyperglycemia, unspecified: Secondary | ICD-10-CM | POA: Diagnosis not present

## 2021-05-29 DIAGNOSIS — R296 Repeated falls: Secondary | ICD-10-CM | POA: Diagnosis not present

## 2021-05-29 DIAGNOSIS — F339 Major depressive disorder, recurrent, unspecified: Secondary | ICD-10-CM | POA: Diagnosis not present

## 2021-05-29 DIAGNOSIS — M25512 Pain in left shoulder: Secondary | ICD-10-CM | POA: Diagnosis not present

## 2021-05-29 DIAGNOSIS — F03A3 Unspecified dementia, mild, with mood disturbance: Secondary | ICD-10-CM | POA: Diagnosis not present

## 2021-05-29 DIAGNOSIS — R7401 Elevation of levels of liver transaminase levels: Secondary | ICD-10-CM | POA: Diagnosis not present

## 2021-05-29 DIAGNOSIS — Z741 Need for assistance with personal care: Secondary | ICD-10-CM | POA: Diagnosis not present

## 2021-05-29 DIAGNOSIS — E1165 Type 2 diabetes mellitus with hyperglycemia: Secondary | ICD-10-CM | POA: Diagnosis not present

## 2021-05-29 DIAGNOSIS — R262 Difficulty in walking, not elsewhere classified: Secondary | ICD-10-CM | POA: Diagnosis not present

## 2021-05-29 DIAGNOSIS — E1122 Type 2 diabetes mellitus with diabetic chronic kidney disease: Secondary | ICD-10-CM | POA: Diagnosis not present

## 2021-05-29 DIAGNOSIS — M6281 Muscle weakness (generalized): Secondary | ICD-10-CM | POA: Diagnosis not present

## 2021-05-29 DIAGNOSIS — G9341 Metabolic encephalopathy: Secondary | ICD-10-CM | POA: Diagnosis not present

## 2021-05-29 DIAGNOSIS — R2681 Unsteadiness on feet: Secondary | ICD-10-CM | POA: Diagnosis not present

## 2021-05-29 DIAGNOSIS — R110 Nausea: Secondary | ICD-10-CM | POA: Diagnosis not present

## 2021-05-29 DIAGNOSIS — S42202D Unspecified fracture of upper end of left humerus, subsequent encounter for fracture with routine healing: Secondary | ICD-10-CM | POA: Diagnosis not present

## 2021-05-29 DIAGNOSIS — M1380 Other specified arthritis, unspecified site: Secondary | ICD-10-CM | POA: Diagnosis not present

## 2021-05-29 DIAGNOSIS — R1312 Dysphagia, oropharyngeal phase: Secondary | ICD-10-CM | POA: Diagnosis not present

## 2021-05-29 DIAGNOSIS — R488 Other symbolic dysfunctions: Secondary | ICD-10-CM | POA: Diagnosis not present

## 2021-06-01 DIAGNOSIS — Z741 Need for assistance with personal care: Secondary | ICD-10-CM | POA: Diagnosis not present

## 2021-06-01 DIAGNOSIS — F0390 Unspecified dementia without behavioral disturbance: Secondary | ICD-10-CM | POA: Diagnosis not present

## 2021-06-01 DIAGNOSIS — S42292D Other displaced fracture of upper end of left humerus, subsequent encounter for fracture with routine healing: Secondary | ICD-10-CM | POA: Diagnosis not present

## 2021-06-01 DIAGNOSIS — S42202D Unspecified fracture of upper end of left humerus, subsequent encounter for fracture with routine healing: Secondary | ICD-10-CM | POA: Diagnosis not present

## 2021-06-01 DIAGNOSIS — R296 Repeated falls: Secondary | ICD-10-CM | POA: Diagnosis not present

## 2021-06-01 DIAGNOSIS — E1165 Type 2 diabetes mellitus with hyperglycemia: Secondary | ICD-10-CM | POA: Diagnosis not present

## 2021-06-02 DIAGNOSIS — S42202D Unspecified fracture of upper end of left humerus, subsequent encounter for fracture with routine healing: Secondary | ICD-10-CM | POA: Diagnosis not present

## 2021-06-02 DIAGNOSIS — E1165 Type 2 diabetes mellitus with hyperglycemia: Secondary | ICD-10-CM | POA: Diagnosis not present

## 2021-06-02 DIAGNOSIS — Z741 Need for assistance with personal care: Secondary | ICD-10-CM | POA: Diagnosis not present

## 2021-06-02 DIAGNOSIS — R296 Repeated falls: Secondary | ICD-10-CM | POA: Diagnosis not present

## 2021-06-02 DIAGNOSIS — F0390 Unspecified dementia without behavioral disturbance: Secondary | ICD-10-CM | POA: Diagnosis not present

## 2021-06-02 DIAGNOSIS — S42292D Other displaced fracture of upper end of left humerus, subsequent encounter for fracture with routine healing: Secondary | ICD-10-CM | POA: Diagnosis not present

## 2021-06-05 DIAGNOSIS — F0390 Unspecified dementia without behavioral disturbance: Secondary | ICD-10-CM | POA: Diagnosis not present

## 2021-06-05 DIAGNOSIS — R296 Repeated falls: Secondary | ICD-10-CM | POA: Diagnosis not present

## 2021-06-05 DIAGNOSIS — S42202D Unspecified fracture of upper end of left humerus, subsequent encounter for fracture with routine healing: Secondary | ICD-10-CM | POA: Diagnosis not present

## 2021-06-05 DIAGNOSIS — S42292D Other displaced fracture of upper end of left humerus, subsequent encounter for fracture with routine healing: Secondary | ICD-10-CM | POA: Diagnosis not present

## 2021-06-05 DIAGNOSIS — E1165 Type 2 diabetes mellitus with hyperglycemia: Secondary | ICD-10-CM | POA: Diagnosis not present

## 2021-06-05 DIAGNOSIS — Z741 Need for assistance with personal care: Secondary | ICD-10-CM | POA: Diagnosis not present

## 2021-06-08 DIAGNOSIS — N1832 Chronic kidney disease, stage 3b: Secondary | ICD-10-CM | POA: Diagnosis not present

## 2021-06-08 DIAGNOSIS — M2012 Hallux valgus (acquired), left foot: Secondary | ICD-10-CM | POA: Diagnosis not present

## 2021-06-08 DIAGNOSIS — E1165 Type 2 diabetes mellitus with hyperglycemia: Secondary | ICD-10-CM | POA: Diagnosis not present

## 2021-06-08 DIAGNOSIS — S42202D Unspecified fracture of upper end of left humerus, subsequent encounter for fracture with routine healing: Secondary | ICD-10-CM | POA: Diagnosis not present

## 2021-06-08 DIAGNOSIS — R296 Repeated falls: Secondary | ICD-10-CM | POA: Diagnosis not present

## 2021-06-08 DIAGNOSIS — F0390 Unspecified dementia without behavioral disturbance: Secondary | ICD-10-CM | POA: Diagnosis not present

## 2021-06-08 DIAGNOSIS — Z741 Need for assistance with personal care: Secondary | ICD-10-CM | POA: Diagnosis not present

## 2021-06-08 DIAGNOSIS — S42292D Other displaced fracture of upper end of left humerus, subsequent encounter for fracture with routine healing: Secondary | ICD-10-CM | POA: Diagnosis not present

## 2021-06-08 DIAGNOSIS — M2011 Hallux valgus (acquired), right foot: Secondary | ICD-10-CM | POA: Diagnosis not present

## 2021-06-09 DIAGNOSIS — S42202D Unspecified fracture of upper end of left humerus, subsequent encounter for fracture with routine healing: Secondary | ICD-10-CM | POA: Diagnosis not present

## 2021-06-09 DIAGNOSIS — E1165 Type 2 diabetes mellitus with hyperglycemia: Secondary | ICD-10-CM | POA: Diagnosis not present

## 2021-06-09 DIAGNOSIS — S42292D Other displaced fracture of upper end of left humerus, subsequent encounter for fracture with routine healing: Secondary | ICD-10-CM | POA: Diagnosis not present

## 2021-06-09 DIAGNOSIS — Z741 Need for assistance with personal care: Secondary | ICD-10-CM | POA: Diagnosis not present

## 2021-06-09 DIAGNOSIS — R296 Repeated falls: Secondary | ICD-10-CM | POA: Diagnosis not present

## 2021-06-09 DIAGNOSIS — F0390 Unspecified dementia without behavioral disturbance: Secondary | ICD-10-CM | POA: Diagnosis not present

## 2021-06-12 DIAGNOSIS — R279 Unspecified lack of coordination: Secondary | ICD-10-CM | POA: Diagnosis not present

## 2021-06-12 DIAGNOSIS — E1122 Type 2 diabetes mellitus with diabetic chronic kidney disease: Secondary | ICD-10-CM | POA: Diagnosis not present

## 2021-06-12 DIAGNOSIS — R488 Other symbolic dysfunctions: Secondary | ICD-10-CM | POA: Diagnosis not present

## 2021-06-12 DIAGNOSIS — M6281 Muscle weakness (generalized): Secondary | ICD-10-CM | POA: Diagnosis not present

## 2021-06-12 DIAGNOSIS — R296 Repeated falls: Secondary | ICD-10-CM | POA: Diagnosis not present

## 2021-06-12 DIAGNOSIS — R7401 Elevation of levels of liver transaminase levels: Secondary | ICD-10-CM | POA: Diagnosis not present

## 2021-06-12 DIAGNOSIS — R1312 Dysphagia, oropharyngeal phase: Secondary | ICD-10-CM | POA: Diagnosis not present

## 2021-06-12 DIAGNOSIS — M1380 Other specified arthritis, unspecified site: Secondary | ICD-10-CM | POA: Diagnosis not present

## 2021-06-12 DIAGNOSIS — F0390 Unspecified dementia without behavioral disturbance: Secondary | ICD-10-CM | POA: Diagnosis not present

## 2021-06-12 DIAGNOSIS — R110 Nausea: Secondary | ICD-10-CM | POA: Diagnosis not present

## 2021-06-12 DIAGNOSIS — K219 Gastro-esophageal reflux disease without esophagitis: Secondary | ICD-10-CM | POA: Diagnosis not present

## 2021-06-12 DIAGNOSIS — S42292D Other displaced fracture of upper end of left humerus, subsequent encounter for fracture with routine healing: Secondary | ICD-10-CM | POA: Diagnosis not present

## 2021-06-12 DIAGNOSIS — F03A3 Unspecified dementia, mild, with mood disturbance: Secondary | ICD-10-CM | POA: Diagnosis not present

## 2021-06-12 DIAGNOSIS — S42202D Unspecified fracture of upper end of left humerus, subsequent encounter for fracture with routine healing: Secondary | ICD-10-CM | POA: Diagnosis not present

## 2021-06-12 DIAGNOSIS — R41841 Cognitive communication deficit: Secondary | ICD-10-CM | POA: Diagnosis not present

## 2021-06-12 DIAGNOSIS — Z741 Need for assistance with personal care: Secondary | ICD-10-CM | POA: Diagnosis not present

## 2021-06-12 DIAGNOSIS — F321 Major depressive disorder, single episode, moderate: Secondary | ICD-10-CM | POA: Diagnosis not present

## 2021-06-12 DIAGNOSIS — M25512 Pain in left shoulder: Secondary | ICD-10-CM | POA: Diagnosis not present

## 2021-06-12 DIAGNOSIS — R262 Difficulty in walking, not elsewhere classified: Secondary | ICD-10-CM | POA: Diagnosis not present

## 2021-06-12 DIAGNOSIS — F339 Major depressive disorder, recurrent, unspecified: Secondary | ICD-10-CM | POA: Diagnosis not present

## 2021-06-12 DIAGNOSIS — R2681 Unsteadiness on feet: Secondary | ICD-10-CM | POA: Diagnosis not present

## 2021-06-12 DIAGNOSIS — R739 Hyperglycemia, unspecified: Secondary | ICD-10-CM | POA: Diagnosis not present

## 2021-06-12 DIAGNOSIS — E119 Type 2 diabetes mellitus without complications: Secondary | ICD-10-CM | POA: Diagnosis not present

## 2021-06-12 DIAGNOSIS — E1165 Type 2 diabetes mellitus with hyperglycemia: Secondary | ICD-10-CM | POA: Diagnosis not present

## 2021-06-12 DIAGNOSIS — G9341 Metabolic encephalopathy: Secondary | ICD-10-CM | POA: Diagnosis not present

## 2021-06-13 DIAGNOSIS — E1165 Type 2 diabetes mellitus with hyperglycemia: Secondary | ICD-10-CM | POA: Diagnosis not present

## 2021-06-13 DIAGNOSIS — S42292D Other displaced fracture of upper end of left humerus, subsequent encounter for fracture with routine healing: Secondary | ICD-10-CM | POA: Diagnosis not present

## 2021-06-13 DIAGNOSIS — S42202A Unspecified fracture of upper end of left humerus, initial encounter for closed fracture: Secondary | ICD-10-CM | POA: Diagnosis not present

## 2021-06-13 DIAGNOSIS — Z741 Need for assistance with personal care: Secondary | ICD-10-CM | POA: Diagnosis not present

## 2021-06-13 DIAGNOSIS — S42202D Unspecified fracture of upper end of left humerus, subsequent encounter for fracture with routine healing: Secondary | ICD-10-CM | POA: Diagnosis not present

## 2021-06-13 DIAGNOSIS — R296 Repeated falls: Secondary | ICD-10-CM | POA: Diagnosis not present

## 2021-06-13 DIAGNOSIS — F0390 Unspecified dementia without behavioral disturbance: Secondary | ICD-10-CM | POA: Diagnosis not present

## 2021-06-14 DIAGNOSIS — M1380 Other specified arthritis, unspecified site: Secondary | ICD-10-CM | POA: Diagnosis not present

## 2021-06-14 DIAGNOSIS — E1165 Type 2 diabetes mellitus with hyperglycemia: Secondary | ICD-10-CM | POA: Diagnosis not present

## 2021-06-14 DIAGNOSIS — Z741 Need for assistance with personal care: Secondary | ICD-10-CM | POA: Diagnosis not present

## 2021-06-14 DIAGNOSIS — F321 Major depressive disorder, single episode, moderate: Secondary | ICD-10-CM | POA: Diagnosis not present

## 2021-06-14 DIAGNOSIS — R739 Hyperglycemia, unspecified: Secondary | ICD-10-CM | POA: Diagnosis not present

## 2021-06-14 DIAGNOSIS — F03A3 Unspecified dementia, mild, with mood disturbance: Secondary | ICD-10-CM | POA: Diagnosis not present

## 2021-06-14 DIAGNOSIS — R2681 Unsteadiness on feet: Secondary | ICD-10-CM | POA: Diagnosis not present

## 2021-06-14 DIAGNOSIS — R7401 Elevation of levels of liver transaminase levels: Secondary | ICD-10-CM | POA: Diagnosis not present

## 2021-06-14 DIAGNOSIS — S42202D Unspecified fracture of upper end of left humerus, subsequent encounter for fracture with routine healing: Secondary | ICD-10-CM | POA: Diagnosis not present

## 2021-06-14 DIAGNOSIS — R1312 Dysphagia, oropharyngeal phase: Secondary | ICD-10-CM | POA: Diagnosis not present

## 2021-06-14 DIAGNOSIS — M6281 Muscle weakness (generalized): Secondary | ICD-10-CM | POA: Diagnosis not present

## 2021-06-14 DIAGNOSIS — E1122 Type 2 diabetes mellitus with diabetic chronic kidney disease: Secondary | ICD-10-CM | POA: Diagnosis not present

## 2021-06-14 DIAGNOSIS — F339 Major depressive disorder, recurrent, unspecified: Secondary | ICD-10-CM | POA: Diagnosis not present

## 2021-06-14 DIAGNOSIS — K219 Gastro-esophageal reflux disease without esophagitis: Secondary | ICD-10-CM | POA: Diagnosis not present

## 2021-06-14 DIAGNOSIS — M25512 Pain in left shoulder: Secondary | ICD-10-CM | POA: Diagnosis not present

## 2021-06-14 DIAGNOSIS — R110 Nausea: Secondary | ICD-10-CM | POA: Diagnosis not present

## 2021-06-14 DIAGNOSIS — R262 Difficulty in walking, not elsewhere classified: Secondary | ICD-10-CM | POA: Diagnosis not present

## 2021-06-14 DIAGNOSIS — R296 Repeated falls: Secondary | ICD-10-CM | POA: Diagnosis not present

## 2021-06-14 DIAGNOSIS — F0390 Unspecified dementia without behavioral disturbance: Secondary | ICD-10-CM | POA: Diagnosis not present

## 2021-06-14 DIAGNOSIS — S42292D Other displaced fracture of upper end of left humerus, subsequent encounter for fracture with routine healing: Secondary | ICD-10-CM | POA: Diagnosis not present

## 2021-06-14 DIAGNOSIS — G9341 Metabolic encephalopathy: Secondary | ICD-10-CM | POA: Diagnosis not present

## 2021-06-14 DIAGNOSIS — R279 Unspecified lack of coordination: Secondary | ICD-10-CM | POA: Diagnosis not present

## 2021-06-14 DIAGNOSIS — R41841 Cognitive communication deficit: Secondary | ICD-10-CM | POA: Diagnosis not present

## 2021-06-14 DIAGNOSIS — R488 Other symbolic dysfunctions: Secondary | ICD-10-CM | POA: Diagnosis not present

## 2021-06-14 DIAGNOSIS — E119 Type 2 diabetes mellitus without complications: Secondary | ICD-10-CM | POA: Diagnosis not present

## 2021-06-15 DIAGNOSIS — K219 Gastro-esophageal reflux disease without esophagitis: Secondary | ICD-10-CM | POA: Diagnosis not present

## 2021-06-15 DIAGNOSIS — F0390 Unspecified dementia without behavioral disturbance: Secondary | ICD-10-CM | POA: Diagnosis not present

## 2021-06-15 DIAGNOSIS — E1122 Type 2 diabetes mellitus with diabetic chronic kidney disease: Secondary | ICD-10-CM | POA: Diagnosis not present

## 2021-06-15 DIAGNOSIS — F331 Major depressive disorder, recurrent, moderate: Secondary | ICD-10-CM | POA: Diagnosis not present

## 2021-06-15 DIAGNOSIS — N183 Chronic kidney disease, stage 3 unspecified: Secondary | ICD-10-CM | POA: Diagnosis not present

## 2021-06-15 DIAGNOSIS — E785 Hyperlipidemia, unspecified: Secondary | ICD-10-CM | POA: Diagnosis not present

## 2021-06-20 DIAGNOSIS — F321 Major depressive disorder, single episode, moderate: Secondary | ICD-10-CM | POA: Diagnosis not present

## 2021-06-20 DIAGNOSIS — F03A3 Unspecified dementia, mild, with mood disturbance: Secondary | ICD-10-CM | POA: Diagnosis not present

## 2021-07-13 DIAGNOSIS — E1165 Type 2 diabetes mellitus with hyperglycemia: Secondary | ICD-10-CM | POA: Diagnosis not present

## 2021-07-13 DIAGNOSIS — K219 Gastro-esophageal reflux disease without esophagitis: Secondary | ICD-10-CM | POA: Diagnosis not present

## 2021-07-13 DIAGNOSIS — Z23 Encounter for immunization: Secondary | ICD-10-CM | POA: Diagnosis not present

## 2021-07-13 DIAGNOSIS — E1122 Type 2 diabetes mellitus with diabetic chronic kidney disease: Secondary | ICD-10-CM | POA: Diagnosis not present

## 2021-07-13 DIAGNOSIS — N183 Chronic kidney disease, stage 3 unspecified: Secondary | ICD-10-CM | POA: Diagnosis not present

## 2021-07-13 DIAGNOSIS — F331 Major depressive disorder, recurrent, moderate: Secondary | ICD-10-CM | POA: Diagnosis not present

## 2021-07-25 DIAGNOSIS — G4720 Circadian rhythm sleep disorder, unspecified type: Secondary | ICD-10-CM | POA: Diagnosis not present

## 2021-07-25 DIAGNOSIS — F321 Major depressive disorder, single episode, moderate: Secondary | ICD-10-CM | POA: Diagnosis not present

## 2021-07-25 DIAGNOSIS — F03A3 Unspecified dementia, mild, with mood disturbance: Secondary | ICD-10-CM | POA: Diagnosis not present

## 2021-07-27 DIAGNOSIS — M25512 Pain in left shoulder: Secondary | ICD-10-CM | POA: Diagnosis not present

## 2021-07-27 DIAGNOSIS — G8929 Other chronic pain: Secondary | ICD-10-CM | POA: Diagnosis not present

## 2021-07-28 DIAGNOSIS — M25512 Pain in left shoulder: Secondary | ICD-10-CM | POA: Diagnosis not present

## 2021-08-01 DIAGNOSIS — G8929 Other chronic pain: Secondary | ICD-10-CM | POA: Diagnosis not present

## 2021-08-01 DIAGNOSIS — M25512 Pain in left shoulder: Secondary | ICD-10-CM | POA: Diagnosis not present

## 2021-08-08 DIAGNOSIS — N183 Chronic kidney disease, stage 3 unspecified: Secondary | ICD-10-CM | POA: Diagnosis not present

## 2021-08-09 DIAGNOSIS — F0390 Unspecified dementia without behavioral disturbance: Secondary | ICD-10-CM | POA: Diagnosis not present

## 2021-08-09 DIAGNOSIS — F331 Major depressive disorder, recurrent, moderate: Secondary | ICD-10-CM | POA: Diagnosis not present

## 2021-08-09 DIAGNOSIS — K219 Gastro-esophageal reflux disease without esophagitis: Secondary | ICD-10-CM | POA: Diagnosis not present

## 2021-08-09 DIAGNOSIS — E1122 Type 2 diabetes mellitus with diabetic chronic kidney disease: Secondary | ICD-10-CM | POA: Diagnosis not present

## 2021-08-15 DIAGNOSIS — E119 Type 2 diabetes mellitus without complications: Secondary | ICD-10-CM | POA: Diagnosis not present

## 2021-08-15 DIAGNOSIS — D519 Vitamin B12 deficiency anemia, unspecified: Secondary | ICD-10-CM | POA: Diagnosis not present

## 2021-08-15 DIAGNOSIS — E559 Vitamin D deficiency, unspecified: Secondary | ICD-10-CM | POA: Diagnosis not present

## 2021-08-29 DIAGNOSIS — F03A3 Unspecified dementia, mild, with mood disturbance: Secondary | ICD-10-CM | POA: Diagnosis not present

## 2021-08-29 DIAGNOSIS — G4720 Circadian rhythm sleep disorder, unspecified type: Secondary | ICD-10-CM | POA: Diagnosis not present

## 2021-08-29 DIAGNOSIS — F321 Major depressive disorder, single episode, moderate: Secondary | ICD-10-CM | POA: Diagnosis not present

## 2021-08-30 DIAGNOSIS — R279 Unspecified lack of coordination: Secondary | ICD-10-CM | POA: Diagnosis not present

## 2021-08-30 DIAGNOSIS — F0390 Unspecified dementia without behavioral disturbance: Secondary | ICD-10-CM | POA: Diagnosis not present

## 2021-08-30 DIAGNOSIS — M25512 Pain in left shoulder: Secondary | ICD-10-CM | POA: Diagnosis not present

## 2021-08-30 DIAGNOSIS — G9341 Metabolic encephalopathy: Secondary | ICD-10-CM | POA: Diagnosis not present

## 2021-08-30 DIAGNOSIS — R2681 Unsteadiness on feet: Secondary | ICD-10-CM | POA: Diagnosis not present

## 2021-08-30 DIAGNOSIS — E1165 Type 2 diabetes mellitus with hyperglycemia: Secondary | ICD-10-CM | POA: Diagnosis not present

## 2021-08-30 DIAGNOSIS — R7401 Elevation of levels of liver transaminase levels: Secondary | ICD-10-CM | POA: Diagnosis not present

## 2021-08-30 DIAGNOSIS — R1312 Dysphagia, oropharyngeal phase: Secondary | ICD-10-CM | POA: Diagnosis not present

## 2021-08-30 DIAGNOSIS — R41841 Cognitive communication deficit: Secondary | ICD-10-CM | POA: Diagnosis not present

## 2021-08-30 DIAGNOSIS — R488 Other symbolic dysfunctions: Secondary | ICD-10-CM | POA: Diagnosis not present

## 2021-08-30 DIAGNOSIS — Z741 Need for assistance with personal care: Secondary | ICD-10-CM | POA: Diagnosis not present

## 2021-08-30 DIAGNOSIS — E1122 Type 2 diabetes mellitus with diabetic chronic kidney disease: Secondary | ICD-10-CM | POA: Diagnosis not present

## 2021-08-30 DIAGNOSIS — R262 Difficulty in walking, not elsewhere classified: Secondary | ICD-10-CM | POA: Diagnosis not present

## 2021-08-30 DIAGNOSIS — R739 Hyperglycemia, unspecified: Secondary | ICD-10-CM | POA: Diagnosis not present

## 2021-08-30 DIAGNOSIS — M6281 Muscle weakness (generalized): Secondary | ICD-10-CM | POA: Diagnosis not present

## 2021-08-30 DIAGNOSIS — F321 Major depressive disorder, single episode, moderate: Secondary | ICD-10-CM | POA: Diagnosis not present

## 2021-08-30 DIAGNOSIS — Z23 Encounter for immunization: Secondary | ICD-10-CM | POA: Diagnosis not present

## 2021-08-30 DIAGNOSIS — M1380 Other specified arthritis, unspecified site: Secondary | ICD-10-CM | POA: Diagnosis not present

## 2021-08-30 DIAGNOSIS — E119 Type 2 diabetes mellitus without complications: Secondary | ICD-10-CM | POA: Diagnosis not present

## 2021-08-30 DIAGNOSIS — R296 Repeated falls: Secondary | ICD-10-CM | POA: Diagnosis not present

## 2021-08-30 DIAGNOSIS — R110 Nausea: Secondary | ICD-10-CM | POA: Diagnosis not present

## 2021-08-30 DIAGNOSIS — F339 Major depressive disorder, recurrent, unspecified: Secondary | ICD-10-CM | POA: Diagnosis not present

## 2021-08-30 DIAGNOSIS — S42292D Other displaced fracture of upper end of left humerus, subsequent encounter for fracture with routine healing: Secondary | ICD-10-CM | POA: Diagnosis not present

## 2021-08-30 DIAGNOSIS — F03A3 Unspecified dementia, mild, with mood disturbance: Secondary | ICD-10-CM | POA: Diagnosis not present

## 2021-08-30 DIAGNOSIS — S42202D Unspecified fracture of upper end of left humerus, subsequent encounter for fracture with routine healing: Secondary | ICD-10-CM | POA: Diagnosis not present

## 2021-09-04 DIAGNOSIS — S42292D Other displaced fracture of upper end of left humerus, subsequent encounter for fracture with routine healing: Secondary | ICD-10-CM | POA: Diagnosis not present

## 2021-09-04 DIAGNOSIS — F0390 Unspecified dementia without behavioral disturbance: Secondary | ICD-10-CM | POA: Diagnosis not present

## 2021-09-04 DIAGNOSIS — Z741 Need for assistance with personal care: Secondary | ICD-10-CM | POA: Diagnosis not present

## 2021-09-04 DIAGNOSIS — Z23 Encounter for immunization: Secondary | ICD-10-CM | POA: Diagnosis not present

## 2021-09-04 DIAGNOSIS — S42202D Unspecified fracture of upper end of left humerus, subsequent encounter for fracture with routine healing: Secondary | ICD-10-CM | POA: Diagnosis not present

## 2021-09-04 DIAGNOSIS — E1165 Type 2 diabetes mellitus with hyperglycemia: Secondary | ICD-10-CM | POA: Diagnosis not present

## 2021-09-05 DIAGNOSIS — F0390 Unspecified dementia without behavioral disturbance: Secondary | ICD-10-CM | POA: Diagnosis not present

## 2021-09-05 DIAGNOSIS — S42202D Unspecified fracture of upper end of left humerus, subsequent encounter for fracture with routine healing: Secondary | ICD-10-CM | POA: Diagnosis not present

## 2021-09-05 DIAGNOSIS — Z23 Encounter for immunization: Secondary | ICD-10-CM | POA: Diagnosis not present

## 2021-09-05 DIAGNOSIS — E1165 Type 2 diabetes mellitus with hyperglycemia: Secondary | ICD-10-CM | POA: Diagnosis not present

## 2021-09-05 DIAGNOSIS — Z741 Need for assistance with personal care: Secondary | ICD-10-CM | POA: Diagnosis not present

## 2021-09-05 DIAGNOSIS — S42292D Other displaced fracture of upper end of left humerus, subsequent encounter for fracture with routine healing: Secondary | ICD-10-CM | POA: Diagnosis not present

## 2021-09-06 ENCOUNTER — Telehealth: Payer: Self-pay | Admitting: Legal Medicine

## 2021-09-06 DIAGNOSIS — S42202D Unspecified fracture of upper end of left humerus, subsequent encounter for fracture with routine healing: Secondary | ICD-10-CM | POA: Diagnosis not present

## 2021-09-06 DIAGNOSIS — E1165 Type 2 diabetes mellitus with hyperglycemia: Secondary | ICD-10-CM | POA: Diagnosis not present

## 2021-09-06 DIAGNOSIS — Z741 Need for assistance with personal care: Secondary | ICD-10-CM | POA: Diagnosis not present

## 2021-09-06 DIAGNOSIS — F0390 Unspecified dementia without behavioral disturbance: Secondary | ICD-10-CM | POA: Diagnosis not present

## 2021-09-06 DIAGNOSIS — Z23 Encounter for immunization: Secondary | ICD-10-CM | POA: Diagnosis not present

## 2021-09-06 DIAGNOSIS — S42292D Other displaced fracture of upper end of left humerus, subsequent encounter for fracture with routine healing: Secondary | ICD-10-CM | POA: Diagnosis not present

## 2021-09-06 NOTE — Telephone Encounter (Signed)
Called patient to schedule AWV, unable to LM, no VM set up

## 2021-09-07 DIAGNOSIS — E1122 Type 2 diabetes mellitus with diabetic chronic kidney disease: Secondary | ICD-10-CM | POA: Diagnosis not present

## 2021-09-07 DIAGNOSIS — E1165 Type 2 diabetes mellitus with hyperglycemia: Secondary | ICD-10-CM | POA: Diagnosis not present

## 2021-09-07 DIAGNOSIS — N183 Chronic kidney disease, stage 3 unspecified: Secondary | ICD-10-CM | POA: Diagnosis not present

## 2021-09-07 DIAGNOSIS — S42292D Other displaced fracture of upper end of left humerus, subsequent encounter for fracture with routine healing: Secondary | ICD-10-CM | POA: Diagnosis not present

## 2021-09-07 DIAGNOSIS — E785 Hyperlipidemia, unspecified: Secondary | ICD-10-CM | POA: Diagnosis not present

## 2021-09-07 DIAGNOSIS — S42202D Unspecified fracture of upper end of left humerus, subsequent encounter for fracture with routine healing: Secondary | ICD-10-CM | POA: Diagnosis not present

## 2021-09-07 DIAGNOSIS — Z741 Need for assistance with personal care: Secondary | ICD-10-CM | POA: Diagnosis not present

## 2021-09-07 DIAGNOSIS — F0390 Unspecified dementia without behavioral disturbance: Secondary | ICD-10-CM | POA: Diagnosis not present

## 2021-09-07 DIAGNOSIS — Z23 Encounter for immunization: Secondary | ICD-10-CM | POA: Diagnosis not present

## 2021-09-08 DIAGNOSIS — S42292D Other displaced fracture of upper end of left humerus, subsequent encounter for fracture with routine healing: Secondary | ICD-10-CM | POA: Diagnosis not present

## 2021-09-08 DIAGNOSIS — S42202D Unspecified fracture of upper end of left humerus, subsequent encounter for fracture with routine healing: Secondary | ICD-10-CM | POA: Diagnosis not present

## 2021-09-08 DIAGNOSIS — F0390 Unspecified dementia without behavioral disturbance: Secondary | ICD-10-CM | POA: Diagnosis not present

## 2021-09-08 DIAGNOSIS — Z23 Encounter for immunization: Secondary | ICD-10-CM | POA: Diagnosis not present

## 2021-09-08 DIAGNOSIS — E1165 Type 2 diabetes mellitus with hyperglycemia: Secondary | ICD-10-CM | POA: Diagnosis not present

## 2021-09-08 DIAGNOSIS — Z741 Need for assistance with personal care: Secondary | ICD-10-CM | POA: Diagnosis not present

## 2021-09-13 DIAGNOSIS — Z23 Encounter for immunization: Secondary | ICD-10-CM | POA: Diagnosis not present

## 2021-09-13 DIAGNOSIS — S42202D Unspecified fracture of upper end of left humerus, subsequent encounter for fracture with routine healing: Secondary | ICD-10-CM | POA: Diagnosis not present

## 2021-09-13 DIAGNOSIS — E1165 Type 2 diabetes mellitus with hyperglycemia: Secondary | ICD-10-CM | POA: Diagnosis not present

## 2021-09-13 DIAGNOSIS — F0390 Unspecified dementia without behavioral disturbance: Secondary | ICD-10-CM | POA: Diagnosis not present

## 2021-09-13 DIAGNOSIS — S42292D Other displaced fracture of upper end of left humerus, subsequent encounter for fracture with routine healing: Secondary | ICD-10-CM | POA: Diagnosis not present

## 2021-09-13 DIAGNOSIS — Z741 Need for assistance with personal care: Secondary | ICD-10-CM | POA: Diagnosis not present

## 2021-09-14 DIAGNOSIS — E1122 Type 2 diabetes mellitus with diabetic chronic kidney disease: Secondary | ICD-10-CM | POA: Diagnosis not present

## 2021-09-14 DIAGNOSIS — F321 Major depressive disorder, single episode, moderate: Secondary | ICD-10-CM | POA: Diagnosis not present

## 2021-09-14 DIAGNOSIS — R2681 Unsteadiness on feet: Secondary | ICD-10-CM | POA: Diagnosis not present

## 2021-09-14 DIAGNOSIS — R488 Other symbolic dysfunctions: Secondary | ICD-10-CM | POA: Diagnosis not present

## 2021-09-14 DIAGNOSIS — E1165 Type 2 diabetes mellitus with hyperglycemia: Secondary | ICD-10-CM | POA: Diagnosis not present

## 2021-09-14 DIAGNOSIS — N183 Chronic kidney disease, stage 3 unspecified: Secondary | ICD-10-CM | POA: Diagnosis not present

## 2021-09-14 DIAGNOSIS — F03A3 Unspecified dementia, mild, with mood disturbance: Secondary | ICD-10-CM | POA: Diagnosis not present

## 2021-09-14 DIAGNOSIS — Z23 Encounter for immunization: Secondary | ICD-10-CM | POA: Diagnosis not present

## 2021-09-14 DIAGNOSIS — Z741 Need for assistance with personal care: Secondary | ICD-10-CM | POA: Diagnosis not present

## 2021-09-14 DIAGNOSIS — G9341 Metabolic encephalopathy: Secondary | ICD-10-CM | POA: Diagnosis not present

## 2021-09-14 DIAGNOSIS — S42202D Unspecified fracture of upper end of left humerus, subsequent encounter for fracture with routine healing: Secondary | ICD-10-CM | POA: Diagnosis not present

## 2021-09-14 DIAGNOSIS — R262 Difficulty in walking, not elsewhere classified: Secondary | ICD-10-CM | POA: Diagnosis not present

## 2021-09-14 DIAGNOSIS — K219 Gastro-esophageal reflux disease without esophagitis: Secondary | ICD-10-CM | POA: Diagnosis not present

## 2021-09-14 DIAGNOSIS — R279 Unspecified lack of coordination: Secondary | ICD-10-CM | POA: Diagnosis not present

## 2021-09-14 DIAGNOSIS — R110 Nausea: Secondary | ICD-10-CM | POA: Diagnosis not present

## 2021-09-14 DIAGNOSIS — M6281 Muscle weakness (generalized): Secondary | ICD-10-CM | POA: Diagnosis not present

## 2021-09-14 DIAGNOSIS — R41841 Cognitive communication deficit: Secondary | ICD-10-CM | POA: Diagnosis not present

## 2021-09-14 DIAGNOSIS — S42292D Other displaced fracture of upper end of left humerus, subsequent encounter for fracture with routine healing: Secondary | ICD-10-CM | POA: Diagnosis not present

## 2021-09-14 DIAGNOSIS — R7401 Elevation of levels of liver transaminase levels: Secondary | ICD-10-CM | POA: Diagnosis not present

## 2021-09-14 DIAGNOSIS — M25512 Pain in left shoulder: Secondary | ICD-10-CM | POA: Diagnosis not present

## 2021-09-14 DIAGNOSIS — M1380 Other specified arthritis, unspecified site: Secondary | ICD-10-CM | POA: Diagnosis not present

## 2021-09-14 DIAGNOSIS — R739 Hyperglycemia, unspecified: Secondary | ICD-10-CM | POA: Diagnosis not present

## 2021-09-14 DIAGNOSIS — N390 Urinary tract infection, site not specified: Secondary | ICD-10-CM | POA: Diagnosis not present

## 2021-09-14 DIAGNOSIS — R1312 Dysphagia, oropharyngeal phase: Secondary | ICD-10-CM | POA: Diagnosis not present

## 2021-09-14 DIAGNOSIS — E119 Type 2 diabetes mellitus without complications: Secondary | ICD-10-CM | POA: Diagnosis not present

## 2021-09-16 DIAGNOSIS — R296 Repeated falls: Secondary | ICD-10-CM | POA: Diagnosis not present

## 2021-09-18 DIAGNOSIS — M1380 Other specified arthritis, unspecified site: Secondary | ICD-10-CM | POA: Diagnosis not present

## 2021-09-18 DIAGNOSIS — E1165 Type 2 diabetes mellitus with hyperglycemia: Secondary | ICD-10-CM | POA: Diagnosis not present

## 2021-09-18 DIAGNOSIS — R279 Unspecified lack of coordination: Secondary | ICD-10-CM | POA: Diagnosis not present

## 2021-09-18 DIAGNOSIS — S42202D Unspecified fracture of upper end of left humerus, subsequent encounter for fracture with routine healing: Secondary | ICD-10-CM | POA: Diagnosis not present

## 2021-09-18 DIAGNOSIS — M25512 Pain in left shoulder: Secondary | ICD-10-CM | POA: Diagnosis not present

## 2021-09-18 DIAGNOSIS — S42292D Other displaced fracture of upper end of left humerus, subsequent encounter for fracture with routine healing: Secondary | ICD-10-CM | POA: Diagnosis not present

## 2021-09-20 DIAGNOSIS — S42202D Unspecified fracture of upper end of left humerus, subsequent encounter for fracture with routine healing: Secondary | ICD-10-CM | POA: Diagnosis not present

## 2021-09-20 DIAGNOSIS — E1165 Type 2 diabetes mellitus with hyperglycemia: Secondary | ICD-10-CM | POA: Diagnosis not present

## 2021-09-20 DIAGNOSIS — S42292D Other displaced fracture of upper end of left humerus, subsequent encounter for fracture with routine healing: Secondary | ICD-10-CM | POA: Diagnosis not present

## 2021-09-20 DIAGNOSIS — M1380 Other specified arthritis, unspecified site: Secondary | ICD-10-CM | POA: Diagnosis not present

## 2021-09-20 DIAGNOSIS — R279 Unspecified lack of coordination: Secondary | ICD-10-CM | POA: Diagnosis not present

## 2021-09-20 DIAGNOSIS — M25512 Pain in left shoulder: Secondary | ICD-10-CM | POA: Diagnosis not present

## 2021-09-21 DIAGNOSIS — S0083XD Contusion of other part of head, subsequent encounter: Secondary | ICD-10-CM | POA: Diagnosis not present

## 2021-09-21 DIAGNOSIS — S42202D Unspecified fracture of upper end of left humerus, subsequent encounter for fracture with routine healing: Secondary | ICD-10-CM | POA: Diagnosis not present

## 2021-09-21 DIAGNOSIS — M25512 Pain in left shoulder: Secondary | ICD-10-CM | POA: Diagnosis not present

## 2021-09-21 DIAGNOSIS — R279 Unspecified lack of coordination: Secondary | ICD-10-CM | POA: Diagnosis not present

## 2021-09-21 DIAGNOSIS — S42292D Other displaced fracture of upper end of left humerus, subsequent encounter for fracture with routine healing: Secondary | ICD-10-CM | POA: Diagnosis not present

## 2021-09-21 DIAGNOSIS — M1380 Other specified arthritis, unspecified site: Secondary | ICD-10-CM | POA: Diagnosis not present

## 2021-09-21 DIAGNOSIS — R296 Repeated falls: Secondary | ICD-10-CM | POA: Diagnosis not present

## 2021-09-21 DIAGNOSIS — E1165 Type 2 diabetes mellitus with hyperglycemia: Secondary | ICD-10-CM | POA: Diagnosis not present

## 2021-09-22 DIAGNOSIS — S42202D Unspecified fracture of upper end of left humerus, subsequent encounter for fracture with routine healing: Secondary | ICD-10-CM | POA: Diagnosis not present

## 2021-09-22 DIAGNOSIS — M25512 Pain in left shoulder: Secondary | ICD-10-CM | POA: Diagnosis not present

## 2021-09-22 DIAGNOSIS — R279 Unspecified lack of coordination: Secondary | ICD-10-CM | POA: Diagnosis not present

## 2021-09-22 DIAGNOSIS — E1165 Type 2 diabetes mellitus with hyperglycemia: Secondary | ICD-10-CM | POA: Diagnosis not present

## 2021-09-22 DIAGNOSIS — M1380 Other specified arthritis, unspecified site: Secondary | ICD-10-CM | POA: Diagnosis not present

## 2021-09-22 DIAGNOSIS — S42292D Other displaced fracture of upper end of left humerus, subsequent encounter for fracture with routine healing: Secondary | ICD-10-CM | POA: Diagnosis not present

## 2021-09-26 DIAGNOSIS — G4720 Circadian rhythm sleep disorder, unspecified type: Secondary | ICD-10-CM | POA: Diagnosis not present

## 2021-09-26 DIAGNOSIS — F321 Major depressive disorder, single episode, moderate: Secondary | ICD-10-CM | POA: Diagnosis not present

## 2021-09-26 DIAGNOSIS — F03A3 Unspecified dementia, mild, with mood disturbance: Secondary | ICD-10-CM | POA: Diagnosis not present

## 2021-10-05 DIAGNOSIS — N183 Chronic kidney disease, stage 3 unspecified: Secondary | ICD-10-CM | POA: Diagnosis not present

## 2021-10-05 DIAGNOSIS — F331 Major depressive disorder, recurrent, moderate: Secondary | ICD-10-CM | POA: Diagnosis not present

## 2021-10-05 DIAGNOSIS — K219 Gastro-esophageal reflux disease without esophagitis: Secondary | ICD-10-CM | POA: Diagnosis not present

## 2021-10-05 DIAGNOSIS — E1122 Type 2 diabetes mellitus with diabetic chronic kidney disease: Secondary | ICD-10-CM | POA: Diagnosis not present

## 2021-10-09 DIAGNOSIS — R279 Unspecified lack of coordination: Secondary | ICD-10-CM | POA: Diagnosis not present

## 2021-10-09 DIAGNOSIS — S42202D Unspecified fracture of upper end of left humerus, subsequent encounter for fracture with routine healing: Secondary | ICD-10-CM | POA: Diagnosis not present

## 2021-10-09 DIAGNOSIS — M1380 Other specified arthritis, unspecified site: Secondary | ICD-10-CM | POA: Diagnosis not present

## 2021-10-09 DIAGNOSIS — E1165 Type 2 diabetes mellitus with hyperglycemia: Secondary | ICD-10-CM | POA: Diagnosis not present

## 2021-10-09 DIAGNOSIS — M25512 Pain in left shoulder: Secondary | ICD-10-CM | POA: Diagnosis not present

## 2021-10-09 DIAGNOSIS — S42292D Other displaced fracture of upper end of left humerus, subsequent encounter for fracture with routine healing: Secondary | ICD-10-CM | POA: Diagnosis not present

## 2021-10-10 DIAGNOSIS — E1159 Type 2 diabetes mellitus with other circulatory complications: Secondary | ICD-10-CM | POA: Diagnosis not present

## 2021-10-10 DIAGNOSIS — B351 Tinea unguium: Secondary | ICD-10-CM | POA: Diagnosis not present

## 2021-10-11 DIAGNOSIS — M1380 Other specified arthritis, unspecified site: Secondary | ICD-10-CM | POA: Diagnosis not present

## 2021-10-11 DIAGNOSIS — S42202D Unspecified fracture of upper end of left humerus, subsequent encounter for fracture with routine healing: Secondary | ICD-10-CM | POA: Diagnosis not present

## 2021-10-11 DIAGNOSIS — E1165 Type 2 diabetes mellitus with hyperglycemia: Secondary | ICD-10-CM | POA: Diagnosis not present

## 2021-10-11 DIAGNOSIS — M25512 Pain in left shoulder: Secondary | ICD-10-CM | POA: Diagnosis not present

## 2021-10-11 DIAGNOSIS — R279 Unspecified lack of coordination: Secondary | ICD-10-CM | POA: Diagnosis not present

## 2021-10-11 DIAGNOSIS — S42292D Other displaced fracture of upper end of left humerus, subsequent encounter for fracture with routine healing: Secondary | ICD-10-CM | POA: Diagnosis not present

## 2021-10-13 DIAGNOSIS — S42202D Unspecified fracture of upper end of left humerus, subsequent encounter for fracture with routine healing: Secondary | ICD-10-CM | POA: Diagnosis not present

## 2021-10-13 DIAGNOSIS — R279 Unspecified lack of coordination: Secondary | ICD-10-CM | POA: Diagnosis not present

## 2021-10-13 DIAGNOSIS — E1165 Type 2 diabetes mellitus with hyperglycemia: Secondary | ICD-10-CM | POA: Diagnosis not present

## 2021-10-13 DIAGNOSIS — S42292D Other displaced fracture of upper end of left humerus, subsequent encounter for fracture with routine healing: Secondary | ICD-10-CM | POA: Diagnosis not present

## 2021-10-13 DIAGNOSIS — M1380 Other specified arthritis, unspecified site: Secondary | ICD-10-CM | POA: Diagnosis not present

## 2021-10-13 DIAGNOSIS — M25512 Pain in left shoulder: Secondary | ICD-10-CM | POA: Diagnosis not present

## 2021-10-18 DIAGNOSIS — M6281 Muscle weakness (generalized): Secondary | ICD-10-CM | POA: Diagnosis not present

## 2021-10-18 DIAGNOSIS — M1380 Other specified arthritis, unspecified site: Secondary | ICD-10-CM | POA: Diagnosis not present

## 2021-10-18 DIAGNOSIS — S42292D Other displaced fracture of upper end of left humerus, subsequent encounter for fracture with routine healing: Secondary | ICD-10-CM | POA: Diagnosis not present

## 2021-10-18 DIAGNOSIS — R739 Hyperglycemia, unspecified: Secondary | ICD-10-CM | POA: Diagnosis not present

## 2021-10-18 DIAGNOSIS — E1122 Type 2 diabetes mellitus with diabetic chronic kidney disease: Secondary | ICD-10-CM | POA: Diagnosis not present

## 2021-10-18 DIAGNOSIS — N183 Chronic kidney disease, stage 3 unspecified: Secondary | ICD-10-CM | POA: Diagnosis not present

## 2021-10-18 DIAGNOSIS — F03A3 Unspecified dementia, mild, with mood disturbance: Secondary | ICD-10-CM | POA: Diagnosis not present

## 2021-10-18 DIAGNOSIS — S42202D Unspecified fracture of upper end of left humerus, subsequent encounter for fracture with routine healing: Secondary | ICD-10-CM | POA: Diagnosis not present

## 2021-10-18 DIAGNOSIS — E785 Hyperlipidemia, unspecified: Secondary | ICD-10-CM | POA: Diagnosis not present

## 2021-10-18 DIAGNOSIS — F320 Major depressive disorder, single episode, mild: Secondary | ICD-10-CM | POA: Diagnosis not present

## 2021-10-18 DIAGNOSIS — R296 Repeated falls: Secondary | ICD-10-CM | POA: Diagnosis not present

## 2021-10-18 DIAGNOSIS — I10 Essential (primary) hypertension: Secondary | ICD-10-CM | POA: Diagnosis not present

## 2021-10-18 DIAGNOSIS — K219 Gastro-esophageal reflux disease without esophagitis: Secondary | ICD-10-CM | POA: Diagnosis not present

## 2021-10-18 DIAGNOSIS — M25512 Pain in left shoulder: Secondary | ICD-10-CM | POA: Diagnosis not present

## 2021-10-18 DIAGNOSIS — F331 Major depressive disorder, recurrent, moderate: Secondary | ICD-10-CM | POA: Diagnosis not present

## 2021-10-19 DIAGNOSIS — M1380 Other specified arthritis, unspecified site: Secondary | ICD-10-CM | POA: Diagnosis not present

## 2021-10-19 DIAGNOSIS — S42202D Unspecified fracture of upper end of left humerus, subsequent encounter for fracture with routine healing: Secondary | ICD-10-CM | POA: Diagnosis not present

## 2021-10-19 DIAGNOSIS — S42292D Other displaced fracture of upper end of left humerus, subsequent encounter for fracture with routine healing: Secondary | ICD-10-CM | POA: Diagnosis not present

## 2021-10-19 DIAGNOSIS — E1122 Type 2 diabetes mellitus with diabetic chronic kidney disease: Secondary | ICD-10-CM | POA: Diagnosis not present

## 2021-10-19 DIAGNOSIS — M6281 Muscle weakness (generalized): Secondary | ICD-10-CM | POA: Diagnosis not present

## 2021-10-19 DIAGNOSIS — F03A3 Unspecified dementia, mild, with mood disturbance: Secondary | ICD-10-CM | POA: Diagnosis not present

## 2021-10-20 DIAGNOSIS — M6281 Muscle weakness (generalized): Secondary | ICD-10-CM | POA: Diagnosis not present

## 2021-10-20 DIAGNOSIS — E1122 Type 2 diabetes mellitus with diabetic chronic kidney disease: Secondary | ICD-10-CM | POA: Diagnosis not present

## 2021-10-20 DIAGNOSIS — M1380 Other specified arthritis, unspecified site: Secondary | ICD-10-CM | POA: Diagnosis not present

## 2021-10-20 DIAGNOSIS — S42292D Other displaced fracture of upper end of left humerus, subsequent encounter for fracture with routine healing: Secondary | ICD-10-CM | POA: Diagnosis not present

## 2021-10-20 DIAGNOSIS — F03A3 Unspecified dementia, mild, with mood disturbance: Secondary | ICD-10-CM | POA: Diagnosis not present

## 2021-10-20 DIAGNOSIS — S42202D Unspecified fracture of upper end of left humerus, subsequent encounter for fracture with routine healing: Secondary | ICD-10-CM | POA: Diagnosis not present

## 2021-10-23 DIAGNOSIS — M6281 Muscle weakness (generalized): Secondary | ICD-10-CM | POA: Diagnosis not present

## 2021-10-23 DIAGNOSIS — R296 Repeated falls: Secondary | ICD-10-CM | POA: Diagnosis not present

## 2021-10-23 DIAGNOSIS — M1380 Other specified arthritis, unspecified site: Secondary | ICD-10-CM | POA: Diagnosis not present

## 2021-10-23 DIAGNOSIS — E785 Hyperlipidemia, unspecified: Secondary | ICD-10-CM | POA: Diagnosis not present

## 2021-10-23 DIAGNOSIS — E1122 Type 2 diabetes mellitus with diabetic chronic kidney disease: Secondary | ICD-10-CM | POA: Diagnosis not present

## 2021-10-23 DIAGNOSIS — F03A3 Unspecified dementia, mild, with mood disturbance: Secondary | ICD-10-CM | POA: Diagnosis not present

## 2021-10-23 DIAGNOSIS — S42292D Other displaced fracture of upper end of left humerus, subsequent encounter for fracture with routine healing: Secondary | ICD-10-CM | POA: Diagnosis not present

## 2021-10-23 DIAGNOSIS — K219 Gastro-esophageal reflux disease without esophagitis: Secondary | ICD-10-CM | POA: Diagnosis not present

## 2021-10-23 DIAGNOSIS — I10 Essential (primary) hypertension: Secondary | ICD-10-CM | POA: Diagnosis not present

## 2021-10-23 DIAGNOSIS — N183 Chronic kidney disease, stage 3 unspecified: Secondary | ICD-10-CM | POA: Diagnosis not present

## 2021-10-23 DIAGNOSIS — S42202D Unspecified fracture of upper end of left humerus, subsequent encounter for fracture with routine healing: Secondary | ICD-10-CM | POA: Diagnosis not present

## 2021-10-23 DIAGNOSIS — R739 Hyperglycemia, unspecified: Secondary | ICD-10-CM | POA: Diagnosis not present

## 2021-10-23 DIAGNOSIS — M25512 Pain in left shoulder: Secondary | ICD-10-CM | POA: Diagnosis not present

## 2021-10-23 DIAGNOSIS — F331 Major depressive disorder, recurrent, moderate: Secondary | ICD-10-CM | POA: Diagnosis not present

## 2021-10-24 DIAGNOSIS — G4720 Circadian rhythm sleep disorder, unspecified type: Secondary | ICD-10-CM | POA: Diagnosis not present

## 2021-10-24 DIAGNOSIS — M1380 Other specified arthritis, unspecified site: Secondary | ICD-10-CM | POA: Diagnosis not present

## 2021-10-24 DIAGNOSIS — E1122 Type 2 diabetes mellitus with diabetic chronic kidney disease: Secondary | ICD-10-CM | POA: Diagnosis not present

## 2021-10-24 DIAGNOSIS — M6281 Muscle weakness (generalized): Secondary | ICD-10-CM | POA: Diagnosis not present

## 2021-10-24 DIAGNOSIS — S42292D Other displaced fracture of upper end of left humerus, subsequent encounter for fracture with routine healing: Secondary | ICD-10-CM | POA: Diagnosis not present

## 2021-10-24 DIAGNOSIS — F321 Major depressive disorder, single episode, moderate: Secondary | ICD-10-CM | POA: Diagnosis not present

## 2021-10-24 DIAGNOSIS — I10 Essential (primary) hypertension: Secondary | ICD-10-CM | POA: Diagnosis not present

## 2021-10-24 DIAGNOSIS — S42202D Unspecified fracture of upper end of left humerus, subsequent encounter for fracture with routine healing: Secondary | ICD-10-CM | POA: Diagnosis not present

## 2021-10-24 DIAGNOSIS — F03A3 Unspecified dementia, mild, with mood disturbance: Secondary | ICD-10-CM | POA: Diagnosis not present

## 2021-10-26 DIAGNOSIS — F03A3 Unspecified dementia, mild, with mood disturbance: Secondary | ICD-10-CM | POA: Diagnosis not present

## 2021-10-26 DIAGNOSIS — M1380 Other specified arthritis, unspecified site: Secondary | ICD-10-CM | POA: Diagnosis not present

## 2021-10-26 DIAGNOSIS — E1122 Type 2 diabetes mellitus with diabetic chronic kidney disease: Secondary | ICD-10-CM | POA: Diagnosis not present

## 2021-10-26 DIAGNOSIS — S42202D Unspecified fracture of upper end of left humerus, subsequent encounter for fracture with routine healing: Secondary | ICD-10-CM | POA: Diagnosis not present

## 2021-10-26 DIAGNOSIS — M6281 Muscle weakness (generalized): Secondary | ICD-10-CM | POA: Diagnosis not present

## 2021-10-26 DIAGNOSIS — S42292D Other displaced fracture of upper end of left humerus, subsequent encounter for fracture with routine healing: Secondary | ICD-10-CM | POA: Diagnosis not present

## 2021-10-31 DIAGNOSIS — M1380 Other specified arthritis, unspecified site: Secondary | ICD-10-CM | POA: Diagnosis not present

## 2021-10-31 DIAGNOSIS — E1122 Type 2 diabetes mellitus with diabetic chronic kidney disease: Secondary | ICD-10-CM | POA: Diagnosis not present

## 2021-10-31 DIAGNOSIS — S42202D Unspecified fracture of upper end of left humerus, subsequent encounter for fracture with routine healing: Secondary | ICD-10-CM | POA: Diagnosis not present

## 2021-10-31 DIAGNOSIS — F03A3 Unspecified dementia, mild, with mood disturbance: Secondary | ICD-10-CM | POA: Diagnosis not present

## 2021-10-31 DIAGNOSIS — M6281 Muscle weakness (generalized): Secondary | ICD-10-CM | POA: Diagnosis not present

## 2021-10-31 DIAGNOSIS — S42292D Other displaced fracture of upper end of left humerus, subsequent encounter for fracture with routine healing: Secondary | ICD-10-CM | POA: Diagnosis not present

## 2021-11-01 DIAGNOSIS — F0390 Unspecified dementia without behavioral disturbance: Secondary | ICD-10-CM | POA: Diagnosis not present

## 2021-11-01 DIAGNOSIS — R262 Difficulty in walking, not elsewhere classified: Secondary | ICD-10-CM | POA: Diagnosis not present

## 2021-11-01 DIAGNOSIS — M6281 Muscle weakness (generalized): Secondary | ICD-10-CM | POA: Diagnosis not present

## 2021-11-01 DIAGNOSIS — R41841 Cognitive communication deficit: Secondary | ICD-10-CM | POA: Diagnosis not present

## 2021-11-01 DIAGNOSIS — F03A3 Unspecified dementia, mild, with mood disturbance: Secondary | ICD-10-CM | POA: Diagnosis not present

## 2021-11-01 DIAGNOSIS — G4700 Insomnia, unspecified: Secondary | ICD-10-CM | POA: Diagnosis not present

## 2021-11-01 DIAGNOSIS — M1380 Other specified arthritis, unspecified site: Secondary | ICD-10-CM | POA: Diagnosis not present

## 2021-11-01 DIAGNOSIS — S42202D Unspecified fracture of upper end of left humerus, subsequent encounter for fracture with routine healing: Secondary | ICD-10-CM | POA: Diagnosis not present

## 2021-11-01 DIAGNOSIS — S42292D Other displaced fracture of upper end of left humerus, subsequent encounter for fracture with routine healing: Secondary | ICD-10-CM | POA: Diagnosis not present

## 2021-11-01 DIAGNOSIS — E1122 Type 2 diabetes mellitus with diabetic chronic kidney disease: Secondary | ICD-10-CM | POA: Diagnosis not present

## 2021-11-06 DIAGNOSIS — S42292D Other displaced fracture of upper end of left humerus, subsequent encounter for fracture with routine healing: Secondary | ICD-10-CM | POA: Diagnosis not present

## 2021-11-06 DIAGNOSIS — S42202D Unspecified fracture of upper end of left humerus, subsequent encounter for fracture with routine healing: Secondary | ICD-10-CM | POA: Diagnosis not present

## 2021-11-06 DIAGNOSIS — E1122 Type 2 diabetes mellitus with diabetic chronic kidney disease: Secondary | ICD-10-CM | POA: Diagnosis not present

## 2021-11-06 DIAGNOSIS — F03A3 Unspecified dementia, mild, with mood disturbance: Secondary | ICD-10-CM | POA: Diagnosis not present

## 2021-11-06 DIAGNOSIS — M6281 Muscle weakness (generalized): Secondary | ICD-10-CM | POA: Diagnosis not present

## 2021-11-06 DIAGNOSIS — M1380 Other specified arthritis, unspecified site: Secondary | ICD-10-CM | POA: Diagnosis not present

## 2021-11-07 DIAGNOSIS — S42292D Other displaced fracture of upper end of left humerus, subsequent encounter for fracture with routine healing: Secondary | ICD-10-CM | POA: Diagnosis not present

## 2021-11-07 DIAGNOSIS — M6281 Muscle weakness (generalized): Secondary | ICD-10-CM | POA: Diagnosis not present

## 2021-11-07 DIAGNOSIS — E1122 Type 2 diabetes mellitus with diabetic chronic kidney disease: Secondary | ICD-10-CM | POA: Diagnosis not present

## 2021-11-07 DIAGNOSIS — S42202D Unspecified fracture of upper end of left humerus, subsequent encounter for fracture with routine healing: Secondary | ICD-10-CM | POA: Diagnosis not present

## 2021-11-07 DIAGNOSIS — F03A3 Unspecified dementia, mild, with mood disturbance: Secondary | ICD-10-CM | POA: Diagnosis not present

## 2021-11-07 DIAGNOSIS — M1380 Other specified arthritis, unspecified site: Secondary | ICD-10-CM | POA: Diagnosis not present

## 2021-11-10 DIAGNOSIS — E119 Type 2 diabetes mellitus without complications: Secondary | ICD-10-CM | POA: Diagnosis not present

## 2021-11-10 DIAGNOSIS — I10 Essential (primary) hypertension: Secondary | ICD-10-CM | POA: Diagnosis not present

## 2021-11-20 DIAGNOSIS — F03A3 Unspecified dementia, mild, with mood disturbance: Secondary | ICD-10-CM | POA: Diagnosis not present

## 2021-11-20 DIAGNOSIS — F321 Major depressive disorder, single episode, moderate: Secondary | ICD-10-CM | POA: Diagnosis not present

## 2021-11-20 DIAGNOSIS — G4720 Circadian rhythm sleep disorder, unspecified type: Secondary | ICD-10-CM | POA: Diagnosis not present

## 2021-12-08 DIAGNOSIS — I10 Essential (primary) hypertension: Secondary | ICD-10-CM | POA: Diagnosis not present

## 2021-12-08 DIAGNOSIS — E119 Type 2 diabetes mellitus without complications: Secondary | ICD-10-CM | POA: Diagnosis not present

## 2021-12-08 DIAGNOSIS — K219 Gastro-esophageal reflux disease without esophagitis: Secondary | ICD-10-CM | POA: Diagnosis not present

## 2021-12-15 DIAGNOSIS — F03A3 Unspecified dementia, mild, with mood disturbance: Secondary | ICD-10-CM | POA: Diagnosis not present

## 2021-12-15 DIAGNOSIS — G4720 Circadian rhythm sleep disorder, unspecified type: Secondary | ICD-10-CM | POA: Diagnosis not present

## 2021-12-15 DIAGNOSIS — F321 Major depressive disorder, single episode, moderate: Secondary | ICD-10-CM | POA: Diagnosis not present

## 2022-01-25 DIAGNOSIS — G4720 Circadian rhythm sleep disorder, unspecified type: Secondary | ICD-10-CM | POA: Diagnosis not present

## 2022-01-25 DIAGNOSIS — F321 Major depressive disorder, single episode, moderate: Secondary | ICD-10-CM | POA: Diagnosis not present

## 2022-01-25 DIAGNOSIS — F03A3 Unspecified dementia, mild, with mood disturbance: Secondary | ICD-10-CM | POA: Diagnosis not present

## 2022-02-03 DIAGNOSIS — E1159 Type 2 diabetes mellitus with other circulatory complications: Secondary | ICD-10-CM | POA: Diagnosis not present

## 2022-02-03 DIAGNOSIS — B351 Tinea unguium: Secondary | ICD-10-CM | POA: Diagnosis not present

## 2022-02-12 DIAGNOSIS — E119 Type 2 diabetes mellitus without complications: Secondary | ICD-10-CM | POA: Diagnosis not present

## 2022-02-13 ENCOUNTER — Inpatient Hospital Stay: Payer: Medicare Other | Admitting: Legal Medicine

## 2022-02-14 DIAGNOSIS — E119 Type 2 diabetes mellitus without complications: Secondary | ICD-10-CM | POA: Diagnosis not present

## 2022-02-14 DIAGNOSIS — Z79899 Other long term (current) drug therapy: Secondary | ICD-10-CM | POA: Diagnosis not present

## 2022-02-14 DIAGNOSIS — E559 Vitamin D deficiency, unspecified: Secondary | ICD-10-CM | POA: Diagnosis not present

## 2022-02-14 DIAGNOSIS — D519 Vitamin B12 deficiency anemia, unspecified: Secondary | ICD-10-CM | POA: Diagnosis not present

## 2022-02-14 DIAGNOSIS — A0471 Enterocolitis due to Clostridium difficile, recurrent: Secondary | ICD-10-CM | POA: Diagnosis not present

## 2022-02-14 DIAGNOSIS — D529 Folate deficiency anemia, unspecified: Secondary | ICD-10-CM | POA: Diagnosis not present

## 2022-02-14 DIAGNOSIS — E74818 Other disorders of glucose transport: Secondary | ICD-10-CM | POA: Diagnosis not present

## 2022-02-16 DIAGNOSIS — E119 Type 2 diabetes mellitus without complications: Secondary | ICD-10-CM | POA: Diagnosis not present

## 2022-02-16 DIAGNOSIS — M1990 Unspecified osteoarthritis, unspecified site: Secondary | ICD-10-CM | POA: Diagnosis not present

## 2022-02-16 DIAGNOSIS — N183 Chronic kidney disease, stage 3 unspecified: Secondary | ICD-10-CM | POA: Diagnosis not present

## 2022-02-16 DIAGNOSIS — D649 Anemia, unspecified: Secondary | ICD-10-CM | POA: Diagnosis not present

## 2022-02-20 DIAGNOSIS — F321 Major depressive disorder, single episode, moderate: Secondary | ICD-10-CM | POA: Diagnosis not present

## 2022-02-20 DIAGNOSIS — F03A3 Unspecified dementia, mild, with mood disturbance: Secondary | ICD-10-CM | POA: Diagnosis not present

## 2022-02-20 DIAGNOSIS — G4720 Circadian rhythm sleep disorder, unspecified type: Secondary | ICD-10-CM | POA: Diagnosis not present

## 2022-03-28 DIAGNOSIS — F32A Depression, unspecified: Secondary | ICD-10-CM | POA: Diagnosis not present

## 2022-03-28 DIAGNOSIS — F03A3 Unspecified dementia, mild, with mood disturbance: Secondary | ICD-10-CM | POA: Diagnosis not present

## 2022-03-28 DIAGNOSIS — M1380 Other specified arthritis, unspecified site: Secondary | ICD-10-CM | POA: Diagnosis not present

## 2022-03-28 DIAGNOSIS — M6281 Muscle weakness (generalized): Secondary | ICD-10-CM | POA: Diagnosis not present

## 2022-03-28 DIAGNOSIS — E1122 Type 2 diabetes mellitus with diabetic chronic kidney disease: Secondary | ICD-10-CM | POA: Diagnosis not present

## 2022-03-28 DIAGNOSIS — S42292D Other displaced fracture of upper end of left humerus, subsequent encounter for fracture with routine healing: Secondary | ICD-10-CM | POA: Diagnosis not present

## 2022-03-28 DIAGNOSIS — I10 Essential (primary) hypertension: Secondary | ICD-10-CM | POA: Diagnosis not present

## 2022-03-28 DIAGNOSIS — K219 Gastro-esophageal reflux disease without esophagitis: Secondary | ICD-10-CM | POA: Diagnosis not present

## 2022-03-28 DIAGNOSIS — N183 Chronic kidney disease, stage 3 unspecified: Secondary | ICD-10-CM | POA: Diagnosis not present

## 2022-03-28 DIAGNOSIS — M25512 Pain in left shoulder: Secondary | ICD-10-CM | POA: Diagnosis not present

## 2022-03-28 DIAGNOSIS — D649 Anemia, unspecified: Secondary | ICD-10-CM | POA: Diagnosis not present

## 2022-03-28 DIAGNOSIS — E785 Hyperlipidemia, unspecified: Secondary | ICD-10-CM | POA: Diagnosis not present

## 2022-03-28 DIAGNOSIS — G4720 Circadian rhythm sleep disorder, unspecified type: Secondary | ICD-10-CM | POA: Diagnosis not present

## 2022-03-28 DIAGNOSIS — R739 Hyperglycemia, unspecified: Secondary | ICD-10-CM | POA: Diagnosis not present

## 2022-03-28 DIAGNOSIS — S42202D Unspecified fracture of upper end of left humerus, subsequent encounter for fracture with routine healing: Secondary | ICD-10-CM | POA: Diagnosis not present

## 2022-03-28 DIAGNOSIS — F321 Major depressive disorder, single episode, moderate: Secondary | ICD-10-CM | POA: Diagnosis not present

## 2022-03-28 DIAGNOSIS — R296 Repeated falls: Secondary | ICD-10-CM | POA: Diagnosis not present

## 2022-03-28 DIAGNOSIS — E119 Type 2 diabetes mellitus without complications: Secondary | ICD-10-CM | POA: Diagnosis not present

## 2022-03-28 DIAGNOSIS — F331 Major depressive disorder, recurrent, moderate: Secondary | ICD-10-CM | POA: Diagnosis not present

## 2022-03-28 DIAGNOSIS — R41841 Cognitive communication deficit: Secondary | ICD-10-CM | POA: Diagnosis not present

## 2022-03-29 DIAGNOSIS — G4720 Circadian rhythm sleep disorder, unspecified type: Secondary | ICD-10-CM | POA: Diagnosis not present

## 2022-03-29 DIAGNOSIS — H524 Presbyopia: Secondary | ICD-10-CM | POA: Diagnosis not present

## 2022-03-29 DIAGNOSIS — S42202D Unspecified fracture of upper end of left humerus, subsequent encounter for fracture with routine healing: Secondary | ICD-10-CM | POA: Diagnosis not present

## 2022-03-29 DIAGNOSIS — F321 Major depressive disorder, single episode, moderate: Secondary | ICD-10-CM | POA: Diagnosis not present

## 2022-03-29 DIAGNOSIS — F32A Depression, unspecified: Secondary | ICD-10-CM | POA: Diagnosis not present

## 2022-03-29 DIAGNOSIS — M6281 Muscle weakness (generalized): Secondary | ICD-10-CM | POA: Diagnosis not present

## 2022-03-29 DIAGNOSIS — S42292D Other displaced fracture of upper end of left humerus, subsequent encounter for fracture with routine healing: Secondary | ICD-10-CM | POA: Diagnosis not present

## 2022-03-29 DIAGNOSIS — E119 Type 2 diabetes mellitus without complications: Secondary | ICD-10-CM | POA: Diagnosis not present

## 2022-03-29 DIAGNOSIS — M1380 Other specified arthritis, unspecified site: Secondary | ICD-10-CM | POA: Diagnosis not present

## 2022-03-29 DIAGNOSIS — E1122 Type 2 diabetes mellitus with diabetic chronic kidney disease: Secondary | ICD-10-CM | POA: Diagnosis not present

## 2022-03-29 DIAGNOSIS — F03A3 Unspecified dementia, mild, with mood disturbance: Secondary | ICD-10-CM | POA: Diagnosis not present

## 2022-03-29 DIAGNOSIS — Z961 Presence of intraocular lens: Secondary | ICD-10-CM | POA: Diagnosis not present

## 2022-03-29 DIAGNOSIS — D649 Anemia, unspecified: Secondary | ICD-10-CM | POA: Diagnosis not present

## 2022-03-30 DIAGNOSIS — M6281 Muscle weakness (generalized): Secondary | ICD-10-CM | POA: Diagnosis not present

## 2022-03-30 DIAGNOSIS — E1122 Type 2 diabetes mellitus with diabetic chronic kidney disease: Secondary | ICD-10-CM | POA: Diagnosis not present

## 2022-03-30 DIAGNOSIS — M1380 Other specified arthritis, unspecified site: Secondary | ICD-10-CM | POA: Diagnosis not present

## 2022-03-30 DIAGNOSIS — F03A3 Unspecified dementia, mild, with mood disturbance: Secondary | ICD-10-CM | POA: Diagnosis not present

## 2022-03-30 DIAGNOSIS — S42202D Unspecified fracture of upper end of left humerus, subsequent encounter for fracture with routine healing: Secondary | ICD-10-CM | POA: Diagnosis not present

## 2022-03-30 DIAGNOSIS — S42292D Other displaced fracture of upper end of left humerus, subsequent encounter for fracture with routine healing: Secondary | ICD-10-CM | POA: Diagnosis not present

## 2022-04-02 DIAGNOSIS — S42202D Unspecified fracture of upper end of left humerus, subsequent encounter for fracture with routine healing: Secondary | ICD-10-CM | POA: Diagnosis not present

## 2022-04-02 DIAGNOSIS — M6281 Muscle weakness (generalized): Secondary | ICD-10-CM | POA: Diagnosis not present

## 2022-04-02 DIAGNOSIS — F03A3 Unspecified dementia, mild, with mood disturbance: Secondary | ICD-10-CM | POA: Diagnosis not present

## 2022-04-02 DIAGNOSIS — E1122 Type 2 diabetes mellitus with diabetic chronic kidney disease: Secondary | ICD-10-CM | POA: Diagnosis not present

## 2022-04-02 DIAGNOSIS — S42292D Other displaced fracture of upper end of left humerus, subsequent encounter for fracture with routine healing: Secondary | ICD-10-CM | POA: Diagnosis not present

## 2022-04-02 DIAGNOSIS — M1380 Other specified arthritis, unspecified site: Secondary | ICD-10-CM | POA: Diagnosis not present

## 2022-04-03 DIAGNOSIS — E1122 Type 2 diabetes mellitus with diabetic chronic kidney disease: Secondary | ICD-10-CM | POA: Diagnosis not present

## 2022-04-03 DIAGNOSIS — S42292D Other displaced fracture of upper end of left humerus, subsequent encounter for fracture with routine healing: Secondary | ICD-10-CM | POA: Diagnosis not present

## 2022-04-03 DIAGNOSIS — F03A3 Unspecified dementia, mild, with mood disturbance: Secondary | ICD-10-CM | POA: Diagnosis not present

## 2022-04-03 DIAGNOSIS — M1380 Other specified arthritis, unspecified site: Secondary | ICD-10-CM | POA: Diagnosis not present

## 2022-04-03 DIAGNOSIS — S42202D Unspecified fracture of upper end of left humerus, subsequent encounter for fracture with routine healing: Secondary | ICD-10-CM | POA: Diagnosis not present

## 2022-04-03 DIAGNOSIS — M6281 Muscle weakness (generalized): Secondary | ICD-10-CM | POA: Diagnosis not present

## 2022-04-04 DIAGNOSIS — S42292D Other displaced fracture of upper end of left humerus, subsequent encounter for fracture with routine healing: Secondary | ICD-10-CM | POA: Diagnosis not present

## 2022-04-04 DIAGNOSIS — M1380 Other specified arthritis, unspecified site: Secondary | ICD-10-CM | POA: Diagnosis not present

## 2022-04-04 DIAGNOSIS — F03A3 Unspecified dementia, mild, with mood disturbance: Secondary | ICD-10-CM | POA: Diagnosis not present

## 2022-04-04 DIAGNOSIS — M6281 Muscle weakness (generalized): Secondary | ICD-10-CM | POA: Diagnosis not present

## 2022-04-04 DIAGNOSIS — S42202D Unspecified fracture of upper end of left humerus, subsequent encounter for fracture with routine healing: Secondary | ICD-10-CM | POA: Diagnosis not present

## 2022-04-04 DIAGNOSIS — E1122 Type 2 diabetes mellitus with diabetic chronic kidney disease: Secondary | ICD-10-CM | POA: Diagnosis not present

## 2022-04-05 DIAGNOSIS — S42202D Unspecified fracture of upper end of left humerus, subsequent encounter for fracture with routine healing: Secondary | ICD-10-CM | POA: Diagnosis not present

## 2022-04-05 DIAGNOSIS — S42292D Other displaced fracture of upper end of left humerus, subsequent encounter for fracture with routine healing: Secondary | ICD-10-CM | POA: Diagnosis not present

## 2022-04-05 DIAGNOSIS — E1122 Type 2 diabetes mellitus with diabetic chronic kidney disease: Secondary | ICD-10-CM | POA: Diagnosis not present

## 2022-04-05 DIAGNOSIS — M1380 Other specified arthritis, unspecified site: Secondary | ICD-10-CM | POA: Diagnosis not present

## 2022-04-05 DIAGNOSIS — F03A3 Unspecified dementia, mild, with mood disturbance: Secondary | ICD-10-CM | POA: Diagnosis not present

## 2022-04-05 DIAGNOSIS — M6281 Muscle weakness (generalized): Secondary | ICD-10-CM | POA: Diagnosis not present

## 2022-04-06 DIAGNOSIS — M1380 Other specified arthritis, unspecified site: Secondary | ICD-10-CM | POA: Diagnosis not present

## 2022-04-06 DIAGNOSIS — E1122 Type 2 diabetes mellitus with diabetic chronic kidney disease: Secondary | ICD-10-CM | POA: Diagnosis not present

## 2022-04-06 DIAGNOSIS — S42292D Other displaced fracture of upper end of left humerus, subsequent encounter for fracture with routine healing: Secondary | ICD-10-CM | POA: Diagnosis not present

## 2022-04-06 DIAGNOSIS — F03A3 Unspecified dementia, mild, with mood disturbance: Secondary | ICD-10-CM | POA: Diagnosis not present

## 2022-04-06 DIAGNOSIS — M6281 Muscle weakness (generalized): Secondary | ICD-10-CM | POA: Diagnosis not present

## 2022-04-06 DIAGNOSIS — S42202D Unspecified fracture of upper end of left humerus, subsequent encounter for fracture with routine healing: Secondary | ICD-10-CM | POA: Diagnosis not present

## 2022-04-10 DIAGNOSIS — E785 Hyperlipidemia, unspecified: Secondary | ICD-10-CM | POA: Diagnosis not present

## 2022-04-10 DIAGNOSIS — M1380 Other specified arthritis, unspecified site: Secondary | ICD-10-CM | POA: Diagnosis not present

## 2022-04-10 DIAGNOSIS — S42292D Other displaced fracture of upper end of left humerus, subsequent encounter for fracture with routine healing: Secondary | ICD-10-CM | POA: Diagnosis not present

## 2022-04-10 DIAGNOSIS — F03A3 Unspecified dementia, mild, with mood disturbance: Secondary | ICD-10-CM | POA: Diagnosis not present

## 2022-04-10 DIAGNOSIS — G4720 Circadian rhythm sleep disorder, unspecified type: Secondary | ICD-10-CM | POA: Diagnosis not present

## 2022-04-10 DIAGNOSIS — F321 Major depressive disorder, single episode, moderate: Secondary | ICD-10-CM | POA: Diagnosis not present

## 2022-04-10 DIAGNOSIS — M6281 Muscle weakness (generalized): Secondary | ICD-10-CM | POA: Diagnosis not present

## 2022-04-10 DIAGNOSIS — E1122 Type 2 diabetes mellitus with diabetic chronic kidney disease: Secondary | ICD-10-CM | POA: Diagnosis not present

## 2022-04-10 DIAGNOSIS — F331 Major depressive disorder, recurrent, moderate: Secondary | ICD-10-CM | POA: Diagnosis not present

## 2022-04-10 DIAGNOSIS — I10 Essential (primary) hypertension: Secondary | ICD-10-CM | POA: Diagnosis not present

## 2022-04-10 DIAGNOSIS — K219 Gastro-esophageal reflux disease without esophagitis: Secondary | ICD-10-CM | POA: Diagnosis not present

## 2022-04-10 DIAGNOSIS — N183 Chronic kidney disease, stage 3 unspecified: Secondary | ICD-10-CM | POA: Diagnosis not present

## 2022-04-10 DIAGNOSIS — M25512 Pain in left shoulder: Secondary | ICD-10-CM | POA: Diagnosis not present

## 2022-04-10 DIAGNOSIS — R739 Hyperglycemia, unspecified: Secondary | ICD-10-CM | POA: Diagnosis not present

## 2022-04-10 DIAGNOSIS — D649 Anemia, unspecified: Secondary | ICD-10-CM | POA: Diagnosis not present

## 2022-04-10 DIAGNOSIS — S42202D Unspecified fracture of upper end of left humerus, subsequent encounter for fracture with routine healing: Secondary | ICD-10-CM | POA: Diagnosis not present

## 2022-04-10 DIAGNOSIS — R296 Repeated falls: Secondary | ICD-10-CM | POA: Diagnosis not present

## 2022-04-10 DIAGNOSIS — R41841 Cognitive communication deficit: Secondary | ICD-10-CM | POA: Diagnosis not present

## 2022-04-11 DIAGNOSIS — S42292D Other displaced fracture of upper end of left humerus, subsequent encounter for fracture with routine healing: Secondary | ICD-10-CM | POA: Diagnosis not present

## 2022-04-11 DIAGNOSIS — S42202D Unspecified fracture of upper end of left humerus, subsequent encounter for fracture with routine healing: Secondary | ICD-10-CM | POA: Diagnosis not present

## 2022-04-11 DIAGNOSIS — M6281 Muscle weakness (generalized): Secondary | ICD-10-CM | POA: Diagnosis not present

## 2022-04-11 DIAGNOSIS — F03A3 Unspecified dementia, mild, with mood disturbance: Secondary | ICD-10-CM | POA: Diagnosis not present

## 2022-04-11 DIAGNOSIS — E1122 Type 2 diabetes mellitus with diabetic chronic kidney disease: Secondary | ICD-10-CM | POA: Diagnosis not present

## 2022-04-11 DIAGNOSIS — M1380 Other specified arthritis, unspecified site: Secondary | ICD-10-CM | POA: Diagnosis not present

## 2022-04-12 DIAGNOSIS — E1122 Type 2 diabetes mellitus with diabetic chronic kidney disease: Secondary | ICD-10-CM | POA: Diagnosis not present

## 2022-04-12 DIAGNOSIS — M1380 Other specified arthritis, unspecified site: Secondary | ICD-10-CM | POA: Diagnosis not present

## 2022-04-12 DIAGNOSIS — S42292D Other displaced fracture of upper end of left humerus, subsequent encounter for fracture with routine healing: Secondary | ICD-10-CM | POA: Diagnosis not present

## 2022-04-12 DIAGNOSIS — M6281 Muscle weakness (generalized): Secondary | ICD-10-CM | POA: Diagnosis not present

## 2022-04-12 DIAGNOSIS — S42202D Unspecified fracture of upper end of left humerus, subsequent encounter for fracture with routine healing: Secondary | ICD-10-CM | POA: Diagnosis not present

## 2022-04-12 DIAGNOSIS — F03A3 Unspecified dementia, mild, with mood disturbance: Secondary | ICD-10-CM | POA: Diagnosis not present

## 2022-04-17 DIAGNOSIS — R41841 Cognitive communication deficit: Secondary | ICD-10-CM | POA: Diagnosis not present

## 2022-04-17 DIAGNOSIS — F331 Major depressive disorder, recurrent, moderate: Secondary | ICD-10-CM | POA: Diagnosis not present

## 2022-04-17 DIAGNOSIS — G4720 Circadian rhythm sleep disorder, unspecified type: Secondary | ICD-10-CM | POA: Diagnosis not present

## 2022-04-17 DIAGNOSIS — M1380 Other specified arthritis, unspecified site: Secondary | ICD-10-CM | POA: Diagnosis not present

## 2022-04-17 DIAGNOSIS — M25512 Pain in left shoulder: Secondary | ICD-10-CM | POA: Diagnosis not present

## 2022-04-17 DIAGNOSIS — F321 Major depressive disorder, single episode, moderate: Secondary | ICD-10-CM | POA: Diagnosis not present

## 2022-04-17 DIAGNOSIS — M6281 Muscle weakness (generalized): Secondary | ICD-10-CM | POA: Diagnosis not present

## 2022-04-17 DIAGNOSIS — R739 Hyperglycemia, unspecified: Secondary | ICD-10-CM | POA: Diagnosis not present

## 2022-04-17 DIAGNOSIS — K219 Gastro-esophageal reflux disease without esophagitis: Secondary | ICD-10-CM | POA: Diagnosis not present

## 2022-04-17 DIAGNOSIS — D649 Anemia, unspecified: Secondary | ICD-10-CM | POA: Diagnosis not present

## 2022-04-17 DIAGNOSIS — N183 Chronic kidney disease, stage 3 unspecified: Secondary | ICD-10-CM | POA: Diagnosis not present

## 2022-04-17 DIAGNOSIS — E785 Hyperlipidemia, unspecified: Secondary | ICD-10-CM | POA: Diagnosis not present

## 2022-04-17 DIAGNOSIS — E1122 Type 2 diabetes mellitus with diabetic chronic kidney disease: Secondary | ICD-10-CM | POA: Diagnosis not present

## 2022-04-17 DIAGNOSIS — S42292D Other displaced fracture of upper end of left humerus, subsequent encounter for fracture with routine healing: Secondary | ICD-10-CM | POA: Diagnosis not present

## 2022-04-17 DIAGNOSIS — R296 Repeated falls: Secondary | ICD-10-CM | POA: Diagnosis not present

## 2022-04-17 DIAGNOSIS — I10 Essential (primary) hypertension: Secondary | ICD-10-CM | POA: Diagnosis not present

## 2022-04-17 DIAGNOSIS — F03A3 Unspecified dementia, mild, with mood disturbance: Secondary | ICD-10-CM | POA: Diagnosis not present

## 2022-04-17 DIAGNOSIS — S42202D Unspecified fracture of upper end of left humerus, subsequent encounter for fracture with routine healing: Secondary | ICD-10-CM | POA: Diagnosis not present

## 2022-04-18 DIAGNOSIS — S42202D Unspecified fracture of upper end of left humerus, subsequent encounter for fracture with routine healing: Secondary | ICD-10-CM | POA: Diagnosis not present

## 2022-04-18 DIAGNOSIS — S42292D Other displaced fracture of upper end of left humerus, subsequent encounter for fracture with routine healing: Secondary | ICD-10-CM | POA: Diagnosis not present

## 2022-04-18 DIAGNOSIS — M1380 Other specified arthritis, unspecified site: Secondary | ICD-10-CM | POA: Diagnosis not present

## 2022-04-18 DIAGNOSIS — E1122 Type 2 diabetes mellitus with diabetic chronic kidney disease: Secondary | ICD-10-CM | POA: Diagnosis not present

## 2022-04-18 DIAGNOSIS — M6281 Muscle weakness (generalized): Secondary | ICD-10-CM | POA: Diagnosis not present

## 2022-04-18 DIAGNOSIS — F03A3 Unspecified dementia, mild, with mood disturbance: Secondary | ICD-10-CM | POA: Diagnosis not present

## 2022-04-19 DIAGNOSIS — S42202D Unspecified fracture of upper end of left humerus, subsequent encounter for fracture with routine healing: Secondary | ICD-10-CM | POA: Diagnosis not present

## 2022-04-19 DIAGNOSIS — S42292D Other displaced fracture of upper end of left humerus, subsequent encounter for fracture with routine healing: Secondary | ICD-10-CM | POA: Diagnosis not present

## 2022-04-19 DIAGNOSIS — M6281 Muscle weakness (generalized): Secondary | ICD-10-CM | POA: Diagnosis not present

## 2022-04-19 DIAGNOSIS — E1122 Type 2 diabetes mellitus with diabetic chronic kidney disease: Secondary | ICD-10-CM | POA: Diagnosis not present

## 2022-04-19 DIAGNOSIS — M1380 Other specified arthritis, unspecified site: Secondary | ICD-10-CM | POA: Diagnosis not present

## 2022-04-19 DIAGNOSIS — F03A3 Unspecified dementia, mild, with mood disturbance: Secondary | ICD-10-CM | POA: Diagnosis not present

## 2022-04-20 DIAGNOSIS — S42292D Other displaced fracture of upper end of left humerus, subsequent encounter for fracture with routine healing: Secondary | ICD-10-CM | POA: Diagnosis not present

## 2022-04-20 DIAGNOSIS — S42202D Unspecified fracture of upper end of left humerus, subsequent encounter for fracture with routine healing: Secondary | ICD-10-CM | POA: Diagnosis not present

## 2022-04-20 DIAGNOSIS — M1380 Other specified arthritis, unspecified site: Secondary | ICD-10-CM | POA: Diagnosis not present

## 2022-04-20 DIAGNOSIS — E1122 Type 2 diabetes mellitus with diabetic chronic kidney disease: Secondary | ICD-10-CM | POA: Diagnosis not present

## 2022-04-20 DIAGNOSIS — F03A3 Unspecified dementia, mild, with mood disturbance: Secondary | ICD-10-CM | POA: Diagnosis not present

## 2022-04-20 DIAGNOSIS — M6281 Muscle weakness (generalized): Secondary | ICD-10-CM | POA: Diagnosis not present

## 2022-04-23 DIAGNOSIS — S42292D Other displaced fracture of upper end of left humerus, subsequent encounter for fracture with routine healing: Secondary | ICD-10-CM | POA: Diagnosis not present

## 2022-04-23 DIAGNOSIS — S42202D Unspecified fracture of upper end of left humerus, subsequent encounter for fracture with routine healing: Secondary | ICD-10-CM | POA: Diagnosis not present

## 2022-04-23 DIAGNOSIS — M1380 Other specified arthritis, unspecified site: Secondary | ICD-10-CM | POA: Diagnosis not present

## 2022-04-23 DIAGNOSIS — F03A3 Unspecified dementia, mild, with mood disturbance: Secondary | ICD-10-CM | POA: Diagnosis not present

## 2022-04-23 DIAGNOSIS — E1122 Type 2 diabetes mellitus with diabetic chronic kidney disease: Secondary | ICD-10-CM | POA: Diagnosis not present

## 2022-04-23 DIAGNOSIS — M6281 Muscle weakness (generalized): Secondary | ICD-10-CM | POA: Diagnosis not present

## 2022-04-24 DIAGNOSIS — E1159 Type 2 diabetes mellitus with other circulatory complications: Secondary | ICD-10-CM | POA: Diagnosis not present

## 2022-04-24 DIAGNOSIS — B351 Tinea unguium: Secondary | ICD-10-CM | POA: Diagnosis not present

## 2022-04-25 DIAGNOSIS — F39 Unspecified mood [affective] disorder: Secondary | ICD-10-CM | POA: Diagnosis not present

## 2022-04-25 DIAGNOSIS — R41841 Cognitive communication deficit: Secondary | ICD-10-CM | POA: Diagnosis not present

## 2022-04-25 DIAGNOSIS — F03918 Unspecified dementia, unspecified severity, with other behavioral disturbance: Secondary | ICD-10-CM | POA: Diagnosis not present

## 2022-04-25 DIAGNOSIS — E119 Type 2 diabetes mellitus without complications: Secondary | ICD-10-CM | POA: Diagnosis not present

## 2022-05-01 DIAGNOSIS — D649 Anemia, unspecified: Secondary | ICD-10-CM | POA: Diagnosis not present

## 2022-05-01 DIAGNOSIS — F321 Major depressive disorder, single episode, moderate: Secondary | ICD-10-CM | POA: Diagnosis not present

## 2022-05-01 DIAGNOSIS — E1122 Type 2 diabetes mellitus with diabetic chronic kidney disease: Secondary | ICD-10-CM | POA: Diagnosis not present

## 2022-05-01 DIAGNOSIS — G4720 Circadian rhythm sleep disorder, unspecified type: Secondary | ICD-10-CM | POA: Diagnosis not present

## 2022-05-01 DIAGNOSIS — F03A3 Unspecified dementia, mild, with mood disturbance: Secondary | ICD-10-CM | POA: Diagnosis not present

## 2022-05-25 DIAGNOSIS — F03A3 Unspecified dementia, mild, with mood disturbance: Secondary | ICD-10-CM | POA: Diagnosis not present

## 2022-05-25 DIAGNOSIS — F321 Major depressive disorder, single episode, moderate: Secondary | ICD-10-CM | POA: Diagnosis not present

## 2022-05-25 DIAGNOSIS — E1122 Type 2 diabetes mellitus with diabetic chronic kidney disease: Secondary | ICD-10-CM | POA: Diagnosis not present

## 2022-05-25 DIAGNOSIS — D649 Anemia, unspecified: Secondary | ICD-10-CM | POA: Diagnosis not present

## 2022-05-28 DIAGNOSIS — S42302D Unspecified fracture of shaft of humerus, left arm, subsequent encounter for fracture with routine healing: Secondary | ICD-10-CM | POA: Diagnosis not present

## 2022-05-28 DIAGNOSIS — E785 Hyperlipidemia, unspecified: Secondary | ICD-10-CM | POA: Diagnosis not present

## 2022-05-28 DIAGNOSIS — I129 Hypertensive chronic kidney disease with stage 1 through stage 4 chronic kidney disease, or unspecified chronic kidney disease: Secondary | ICD-10-CM | POA: Diagnosis not present

## 2022-05-28 DIAGNOSIS — G40909 Epilepsy, unspecified, not intractable, without status epilepticus: Secondary | ICD-10-CM | POA: Diagnosis not present

## 2022-05-28 DIAGNOSIS — E1122 Type 2 diabetes mellitus with diabetic chronic kidney disease: Secondary | ICD-10-CM | POA: Diagnosis not present

## 2022-05-28 DIAGNOSIS — M1990 Unspecified osteoarthritis, unspecified site: Secondary | ICD-10-CM | POA: Diagnosis not present

## 2022-05-28 DIAGNOSIS — Z7985 Long-term (current) use of injectable non-insulin antidiabetic drugs: Secondary | ICD-10-CM | POA: Diagnosis not present

## 2022-05-28 DIAGNOSIS — D631 Anemia in chronic kidney disease: Secondary | ICD-10-CM | POA: Diagnosis not present

## 2022-05-28 DIAGNOSIS — N1831 Chronic kidney disease, stage 3a: Secondary | ICD-10-CM | POA: Diagnosis not present

## 2022-05-28 DIAGNOSIS — E1151 Type 2 diabetes mellitus with diabetic peripheral angiopathy without gangrene: Secondary | ICD-10-CM | POA: Diagnosis not present

## 2022-05-28 DIAGNOSIS — F329 Major depressive disorder, single episode, unspecified: Secondary | ICD-10-CM | POA: Diagnosis not present

## 2022-05-28 DIAGNOSIS — G4700 Insomnia, unspecified: Secondary | ICD-10-CM | POA: Diagnosis not present

## 2022-05-28 DIAGNOSIS — S62101D Fracture of unspecified carpal bone, right wrist, subsequent encounter for fracture with routine healing: Secondary | ICD-10-CM | POA: Diagnosis not present

## 2022-05-28 DIAGNOSIS — H04129 Dry eye syndrome of unspecified lacrimal gland: Secondary | ICD-10-CM | POA: Diagnosis not present

## 2022-05-28 DIAGNOSIS — Z9181 History of falling: Secondary | ICD-10-CM | POA: Diagnosis not present

## 2022-05-28 DIAGNOSIS — R1310 Dysphagia, unspecified: Secondary | ICD-10-CM | POA: Diagnosis not present

## 2022-05-28 DIAGNOSIS — S22088D Other fracture of T11-T12 vertebra, subsequent encounter for fracture with routine healing: Secondary | ICD-10-CM | POA: Diagnosis not present

## 2022-05-28 DIAGNOSIS — Z85841 Personal history of malignant neoplasm of brain: Secondary | ICD-10-CM | POA: Diagnosis not present

## 2022-05-28 DIAGNOSIS — K219 Gastro-esophageal reflux disease without esophagitis: Secondary | ICD-10-CM | POA: Diagnosis not present

## 2022-05-28 DIAGNOSIS — F0393 Unspecified dementia, unspecified severity, with mood disturbance: Secondary | ICD-10-CM | POA: Diagnosis not present

## 2022-05-29 DIAGNOSIS — D631 Anemia in chronic kidney disease: Secondary | ICD-10-CM | POA: Diagnosis not present

## 2022-05-29 DIAGNOSIS — S22088D Other fracture of T11-T12 vertebra, subsequent encounter for fracture with routine healing: Secondary | ICD-10-CM | POA: Diagnosis not present

## 2022-05-29 DIAGNOSIS — S62101D Fracture of unspecified carpal bone, right wrist, subsequent encounter for fracture with routine healing: Secondary | ICD-10-CM | POA: Diagnosis not present

## 2022-05-29 DIAGNOSIS — I129 Hypertensive chronic kidney disease with stage 1 through stage 4 chronic kidney disease, or unspecified chronic kidney disease: Secondary | ICD-10-CM | POA: Diagnosis not present

## 2022-05-29 DIAGNOSIS — N1831 Chronic kidney disease, stage 3a: Secondary | ICD-10-CM | POA: Diagnosis not present

## 2022-05-29 DIAGNOSIS — E1122 Type 2 diabetes mellitus with diabetic chronic kidney disease: Secondary | ICD-10-CM | POA: Diagnosis not present

## 2022-06-04 ENCOUNTER — Telehealth: Payer: Self-pay

## 2022-06-04 ENCOUNTER — Ambulatory Visit (INDEPENDENT_AMBULATORY_CARE_PROVIDER_SITE_OTHER): Payer: Medicare Other | Admitting: Physician Assistant

## 2022-06-04 VITALS — BP 122/60 | HR 98 | Temp 97.3°F | Resp 18 | Ht 64.0 in | Wt 107.0 lb

## 2022-06-04 DIAGNOSIS — F02818 Dementia in other diseases classified elsewhere, unspecified severity, with other behavioral disturbance: Secondary | ICD-10-CM

## 2022-06-04 DIAGNOSIS — Z794 Long term (current) use of insulin: Secondary | ICD-10-CM

## 2022-06-04 DIAGNOSIS — F320 Major depressive disorder, single episode, mild: Secondary | ICD-10-CM

## 2022-06-04 DIAGNOSIS — E119 Type 2 diabetes mellitus without complications: Secondary | ICD-10-CM

## 2022-06-04 DIAGNOSIS — G301 Alzheimer's disease with late onset: Secondary | ICD-10-CM

## 2022-06-04 DIAGNOSIS — E1142 Type 2 diabetes mellitus with diabetic polyneuropathy: Secondary | ICD-10-CM | POA: Diagnosis not present

## 2022-06-04 DIAGNOSIS — F0280 Dementia in other diseases classified elsewhere without behavioral disturbance: Secondary | ICD-10-CM | POA: Diagnosis not present

## 2022-06-04 DIAGNOSIS — E0789 Other specified disorders of thyroid: Secondary | ICD-10-CM | POA: Diagnosis not present

## 2022-06-04 DIAGNOSIS — Z1211 Encounter for screening for malignant neoplasm of colon: Secondary | ICD-10-CM

## 2022-06-04 DIAGNOSIS — Z1231 Encounter for screening mammogram for malignant neoplasm of breast: Secondary | ICD-10-CM | POA: Diagnosis not present

## 2022-06-04 DIAGNOSIS — R899 Unspecified abnormal finding in specimens from other organs, systems and tissues: Secondary | ICD-10-CM | POA: Diagnosis not present

## 2022-06-04 NOTE — Telephone Encounter (Signed)
Verbal was given for nursing for once a week for 3 weeks to [facility] from [facility].

## 2022-06-05 ENCOUNTER — Other Ambulatory Visit: Payer: Self-pay | Admitting: Physician Assistant

## 2022-06-05 ENCOUNTER — Encounter: Payer: Self-pay | Admitting: Physician Assistant

## 2022-06-05 DIAGNOSIS — E1122 Type 2 diabetes mellitus with diabetic chronic kidney disease: Secondary | ICD-10-CM | POA: Diagnosis not present

## 2022-06-05 DIAGNOSIS — I129 Hypertensive chronic kidney disease with stage 1 through stage 4 chronic kidney disease, or unspecified chronic kidney disease: Secondary | ICD-10-CM | POA: Diagnosis not present

## 2022-06-05 DIAGNOSIS — N1831 Chronic kidney disease, stage 3a: Secondary | ICD-10-CM | POA: Diagnosis not present

## 2022-06-05 DIAGNOSIS — R899 Unspecified abnormal finding in specimens from other organs, systems and tissues: Secondary | ICD-10-CM

## 2022-06-05 DIAGNOSIS — S62101D Fracture of unspecified carpal bone, right wrist, subsequent encounter for fracture with routine healing: Secondary | ICD-10-CM | POA: Diagnosis not present

## 2022-06-05 DIAGNOSIS — D631 Anemia in chronic kidney disease: Secondary | ICD-10-CM | POA: Diagnosis not present

## 2022-06-05 DIAGNOSIS — E0789 Other specified disorders of thyroid: Secondary | ICD-10-CM

## 2022-06-05 DIAGNOSIS — S22088D Other fracture of T11-T12 vertebra, subsequent encounter for fracture with routine healing: Secondary | ICD-10-CM | POA: Diagnosis not present

## 2022-06-05 LAB — CBC WITH DIFFERENTIAL/PLATELET
Basophils Absolute: 0 10*3/uL (ref 0.0–0.2)
Basos: 1 %
EOS (ABSOLUTE): 0.1 10*3/uL (ref 0.0–0.4)
Eos: 1 %
Hematocrit: 32.9 % — ABNORMAL LOW (ref 34.0–46.6)
Hemoglobin: 10.8 g/dL — ABNORMAL LOW (ref 11.1–15.9)
Immature Grans (Abs): 0 10*3/uL (ref 0.0–0.1)
Immature Granulocytes: 0 %
Lymphocytes Absolute: 1.1 10*3/uL (ref 0.7–3.1)
Lymphs: 21 %
MCH: 30.8 pg (ref 26.6–33.0)
MCHC: 32.8 g/dL (ref 31.5–35.7)
MCV: 94 fL (ref 79–97)
Monocytes Absolute: 0.4 10*3/uL (ref 0.1–0.9)
Monocytes: 8 %
Neutrophils Absolute: 3.4 10*3/uL (ref 1.4–7.0)
Neutrophils: 69 %
Platelets: 241 10*3/uL (ref 150–450)
RBC: 3.51 x10E6/uL — ABNORMAL LOW (ref 3.77–5.28)
RDW: 11.8 % (ref 11.7–15.4)
WBC: 5 10*3/uL (ref 3.4–10.8)

## 2022-06-05 LAB — COMPREHENSIVE METABOLIC PANEL
ALT: 20 IU/L (ref 0–32)
AST: 16 IU/L (ref 0–40)
Albumin/Globulin Ratio: 2.3 — ABNORMAL HIGH (ref 1.2–2.2)
Albumin: 4.6 g/dL (ref 3.9–4.9)
Alkaline Phosphatase: 64 IU/L (ref 44–121)
BUN/Creatinine Ratio: 19 (ref 12–28)
BUN: 29 mg/dL — ABNORMAL HIGH (ref 8–27)
Bilirubin Total: 0.3 mg/dL (ref 0.0–1.2)
CO2: 25 mmol/L (ref 20–29)
Calcium: 10.1 mg/dL (ref 8.7–10.3)
Chloride: 107 mmol/L — ABNORMAL HIGH (ref 96–106)
Creatinine, Ser: 1.53 mg/dL — ABNORMAL HIGH (ref 0.57–1.00)
Globulin, Total: 2 g/dL (ref 1.5–4.5)
Glucose: 140 mg/dL — ABNORMAL HIGH (ref 70–99)
Potassium: 4.9 mmol/L (ref 3.5–5.2)
Sodium: 146 mmol/L — ABNORMAL HIGH (ref 134–144)
Total Protein: 6.6 g/dL (ref 6.0–8.5)
eGFR: 37 mL/min/{1.73_m2} — ABNORMAL LOW (ref >59)

## 2022-06-05 LAB — HEMOGLOBIN A1C
Est. average glucose Bld gHb Est-mCnc: 160 mg/dL
Hgb A1c MFr Bld: 7.2 % — ABNORMAL HIGH (ref 4.8–5.6)

## 2022-06-05 LAB — TSH: TSH: 0.046 u[IU]/mL — ABNORMAL LOW (ref 0.450–4.500)

## 2022-06-05 MED ORDER — DONEPEZIL HCL 10 MG PO TABS: 10.0000 mg | ORAL_TABLET | Freq: Every day | ORAL | 1 refills | Status: DC

## 2022-06-05 MED ORDER — TRULICITY 1.5 MG/0.5ML ~~LOC~~ SOAJ: 1.5000 mg | SUBCUTANEOUS | 2 refills | Status: DC

## 2022-06-05 MED ORDER — TRAZODONE HCL 100 MG PO TABS: 100.0000 mg | ORAL_TABLET | Freq: Every day | ORAL | 1 refills | Status: DC

## 2022-06-05 MED ORDER — CITALOPRAM HYDROBROMIDE 10 MG PO TABS: 10.0000 mg | ORAL_TABLET | Freq: Every day | ORAL | 1 refills | Status: DC

## 2022-06-05 NOTE — Progress Notes (Addendum)
Established Patient Office Visit  Subjective:  Patient ID: Donna Moran, female    DOB: 11-01-1955  Age: 67 y.o. MRN: [id number]  CC:  Chief Complaint  Patient presents with   nursing home follow up    HPI Donna Moran presents for chronic health management --- pt has not been seen in office for over 1 and 1/2 years.  She had a fracture of her left shoulder in 2022 and was discharged to home for 4 weeks and had a demise in her health then admitted to [facility].  She was a resident there for the past 14 months but was discharged last week.  She is currently doing well at home with her husband (per her daughter who accompanies her today)  Pt with history of NIDDM - has had for several years - unsure exactly.  States she is checking her glucose and it has been within fasting of 120s-130s.  She is taking trulicity weekly.  She is overdue for eye appt and will schedule Is due for labwork  Pt with history of mild depression and anxiety as well as early onset of dementia.  She is currently on citalopram 10mg  , desyrel 100mg  qhs and aricept 10mg  Daughter states she has been on those medications throughout her stay at Rehab  Pt would like to have screening mammogram  Pt would like referral to GI for screening colonscopy - she has had chronic issues also with intermittent constipation as well She states that she feels like she has pressure around rectal area and would like examined today  Past Medical History:  Diagnosis Date   Diabetes mellitus without complication (HCC)    History of blood transfusion    Hyperlipidemia     Past Surgical History:  Procedure Laterality Date   CATARACT EXTRACTION Bilateral 04/2013-05/2013    Family History  Problem Relation Age of Onset   Cancer Neg Hx    Diabetes Neg Hx    Stroke Neg Hx    Breast cancer Neg Hx     Social History   Socioeconomic History   Marital status: Married    Spouse name: Not on file   Number of  children: 2   Years of education: Not on file   Highest education level: Not on file  Occupational History   Occupation: Unemployed    Employer: NOT EMPLOYED  Tobacco Use   Smoking status: Former    Types: Cigarettes    Quit date: 2012    Years since quitting: 12.4   Smokeless tobacco: Never  Substance and Sexual Activity   Alcohol use: No   Drug use: No   Sexual activity: Yes  Other Topics Concern   Not on file  Social History Narrative   Regular exercise-no   Caffeine Use-yes   Social Determinants of Health   Financial Resource Strain: Not on file  Food Insecurity: Not on file  Transportation Needs: Not on file  Physical Activity: Not on file  Stress: Not on file  Social Connections: Not on file  Intimate Partner Violence: Not on file     Current Outpatient Medications:    citalopram (CELEXA) 10 MG tablet, Take 1 tablet (10 mg total) by mouth daily., Disp: 90 tablet, Rfl: 1   donepezil (ARICEPT) 10 MG tablet, Take 1 tablet (10 mg total) by mouth at bedtime., Disp: 90 tablet, Rfl: 1   Dulaglutide (TRULICITY) 1.5 MG/0.5ML SOPN, Inject 1.5 mg into the skin once a week., Disp: 6 mL, Rfl:  0   traZODone (DESYREL) 100 MG tablet, Take 1 tablet (100 mg total) by mouth at bedtime., Disp: 90 tablet, Rfl: 1   Allergies  Allergen Reactions   Oxycodone Nausea Only   Aspirin Nausea Only    Can take coated aspirin    ROS CONSTITUTIONAL: Negative for chills, fatigue, fever, unintentional weight gain and unintentional weight loss.  E/N/T: Negative for ear pain, nasal congestion and sore throat.  CARDIOVASCULAR: Negative for chest pain, dizziness, palpitations and pedal edema.  RESPIRATORY: Negative for recent cough and dyspnea.  GASTROINTESTINAL: see HPI MSK: Negative for arthralgias and myalgias.  INTEGUMENTARY: Negative for rash.  NEUROLOGICAL: Negative for dizziness and headaches.  PSYCHIATRIC: Negative for sleep disturbance and to question depression screen.  Negative for  depression, negative for anhedonia.        Objective:    PHYSICAL EXAM:   VS: BP 122/60   Pulse 98   Temp (!) 97.3 F (36.3 C)   Resp 18   Ht 5\' 4"  (1.626 m)   Wt 107 lb (48.5 kg)   SpO2 95%   BMI 18.37 kg/m   GEN: Well nourished, well developed, in no acute distress  Cardiac: RRR; no murmurs, rubs, or gallops,no edema -  Respiratory:  normal respiratory rate and pattern with no distress - normal breath sounds with no rales, rhonchi, wheezes or rubs GI: normal bowel sounds, no masses or tenderness - external rectal area shows thickened tissue around anus but no discreet mass or hemorrhoid Skin: warm and dry, no rash  Psych: euthymic mood, appropriate affect and demeanor  BP 122/60   Pulse 98   Temp (!) 97.3 F (36.3 C)   Resp 18   Ht 5\' 4"  (1.626 m)   Wt 107 lb (48.5 kg)   SpO2 95%   BMI 18.37 kg/m  Wt Readings from Last 3 Encounters:  09/05/22 105 lb 3.2 oz (47.7 kg)  06/04/22 107 lb (48.5 kg)  12/27/20 103 lb (46.7 kg)     Health Maintenance Due  Topic Date Due   Medicare Annual Wellness (AWV)  Never done   Zoster Vaccines- Shingrix (1 of 2) Never done   OPHTHALMOLOGY EXAM  09/28/2020   COVID-19 Vaccine (3 - 2023-24 season) 12/15/2021    There are no preventive care reminders to display for this patient.  Lab Results  Component Value Date   TSH 0.046 (L) 06/04/2022   Lab Results  Component Value Date   WBC 6.5 09/05/2022   HGB 10.8 (L) 09/05/2022   HCT 33.1 (L) 09/05/2022   MCV 92 09/05/2022   PLT 205 09/05/2022   Lab Results  Component Value Date   NA 142 09/05/2022   K 4.8 09/05/2022   CO2 24 09/05/2022   GLUCOSE 156 (H) 09/05/2022   BUN 25 09/05/2022   CREATININE 1.41 (H) 09/05/2022   BILITOT 0.3 09/05/2022   ALKPHOS 56 09/05/2022   AST 15 09/05/2022   ALT 18 09/05/2022   PROT 6.4 09/05/2022   ALBUMIN 4.3 09/05/2022   CALCIUM 9.7 09/05/2022   EGFR 40 (L) 09/05/2022   GFR 71.18 01/26/2015   Lab Results  Component Value Date    CHOL 231 (H) 09/05/2022   Lab Results  Component Value Date   HDL 87 09/05/2022   Lab Results  Component Value Date   LDLCALC 129 (H) 09/05/2022   Lab Results  Component Value Date   TRIG 85 09/05/2022   Lab Results  Component Value Date   CHOLHDL 2.7  09/05/2022   Lab Results  Component Value Date   HGBA1C 7.5 (H) 09/05/2022      Assessment & Plan:   Problem List Items Addressed This Visit       Other   Depression, major, single episode, mild (HCC)   Relevant Medications   citalopram (CELEXA) 10 MG tablet   traZODone (DESYREL) 100 MG tablet   Other Relevant Orders   TSH (Completed)   Other Visit Diagnoses     NIDDM - controlled  -  Primary   Relevant Medications   Dulaglutide (TRULICITY) 1.5 MG/0.5ML SOPN   Other Relevant Orders   CBC with Differential/Platelet (Completed)   Comprehensive metabolic panel (Completed)   Hemoglobin A1c (Completed)   TSH (Completed)   Late onset Alzheimer's dementia with behavioral disturbance (HCC)       Relevant Medications   citalopram (CELEXA) 10 MG tablet   donepezil (ARICEPT) 10 MG tablet   traZODone (DESYREL) 100 MG tablet   Other Relevant Orders   CBC with Differential/Platelet (Completed)   Comprehensive metabolic panel (Completed)   TSH (Completed)   Encounter for screening mammogram for breast cancer       Relevant Orders   MM DIGITAL SCREENING BILATERAL   Colon cancer screening       Relevant Orders   Ambulatory referral to Gastroenterology   Late onset Alzheimer's dementia without behavioral disturbance (HCC)       Relevant Medications   citalopram (CELEXA) 10 MG tablet   donepezil (ARICEPT) 10 MG tablet   traZODone (DESYREL) 100 MG tablet                         Meds ordered this encounter  Medications   citalopram (CELEXA) 10 MG tablet    Sig: Take 1 tablet (10 mg total) by mouth daily.    Dispense:  90 tablet    Refill:  1    Order Specific Question:   Supervising Provider    Answer:    COX, Aniceto Boss   donepezil (ARICEPT) 10 MG tablet    Sig: Take 1 tablet (10 mg total) by mouth at bedtime.    Dispense:  90 tablet    Refill:  1    Order Specific Question:   Supervising Provider    Answer:   Corey Harold   DISCONTD: Dulaglutide (TRULICITY) 1.5 MG/0.5ML SOPN    Sig: Inject 1.5 mg into the skin once a week.    Dispense:  6 mL    Refill:  2    Order Specific Question:   Supervising Provider    Answer:   Corey Harold   traZODone (DESYREL) 100 MG tablet    Sig: Take 1 tablet (100 mg total) by mouth at bedtime.    Dispense:  90 tablet    Refill:  1    Order Specific Question:   Supervising Provider    Answer:   Corey Harold    Follow-up: Return in about 3 months (around 09/02/2022) for chronic fasting follow up.    SARA R Racquel Arkin, PA-C

## 2022-06-06 LAB — IRON,TIBC AND FERRITIN PANEL
Ferritin: 114 ng/mL (ref 15–150)
Iron Saturation: 25 % (ref 15–55)
Iron: 63 ug/dL (ref 27–139)
Total Iron Binding Capacity: 255 ug/dL (ref 250–450)
UIBC: 192 ug/dL (ref 118–369)

## 2022-06-06 LAB — T4, FREE: Free T4: 1.15 ng/dL (ref 0.82–1.77)

## 2022-06-06 LAB — T3, FREE: T3, Free: 2.5 pg/mL (ref 2.0–4.4)

## 2022-06-06 LAB — SPECIMEN STATUS REPORT

## 2022-06-07 DIAGNOSIS — S62101D Fracture of unspecified carpal bone, right wrist, subsequent encounter for fracture with routine healing: Secondary | ICD-10-CM | POA: Diagnosis not present

## 2022-06-07 DIAGNOSIS — E1122 Type 2 diabetes mellitus with diabetic chronic kidney disease: Secondary | ICD-10-CM | POA: Diagnosis not present

## 2022-06-07 DIAGNOSIS — N1831 Chronic kidney disease, stage 3a: Secondary | ICD-10-CM | POA: Diagnosis not present

## 2022-06-07 DIAGNOSIS — D631 Anemia in chronic kidney disease: Secondary | ICD-10-CM | POA: Diagnosis not present

## 2022-06-07 DIAGNOSIS — S22088D Other fracture of T11-T12 vertebra, subsequent encounter for fracture with routine healing: Secondary | ICD-10-CM | POA: Diagnosis not present

## 2022-06-07 DIAGNOSIS — I129 Hypertensive chronic kidney disease with stage 1 through stage 4 chronic kidney disease, or unspecified chronic kidney disease: Secondary | ICD-10-CM | POA: Diagnosis not present

## 2022-06-12 DIAGNOSIS — S22088D Other fracture of T11-T12 vertebra, subsequent encounter for fracture with routine healing: Secondary | ICD-10-CM | POA: Diagnosis not present

## 2022-06-12 DIAGNOSIS — E1122 Type 2 diabetes mellitus with diabetic chronic kidney disease: Secondary | ICD-10-CM | POA: Diagnosis not present

## 2022-06-12 DIAGNOSIS — S62101D Fracture of unspecified carpal bone, right wrist, subsequent encounter for fracture with routine healing: Secondary | ICD-10-CM | POA: Diagnosis not present

## 2022-06-12 DIAGNOSIS — I129 Hypertensive chronic kidney disease with stage 1 through stage 4 chronic kidney disease, or unspecified chronic kidney disease: Secondary | ICD-10-CM | POA: Diagnosis not present

## 2022-06-12 DIAGNOSIS — D631 Anemia in chronic kidney disease: Secondary | ICD-10-CM | POA: Diagnosis not present

## 2022-06-12 DIAGNOSIS — N1831 Chronic kidney disease, stage 3a: Secondary | ICD-10-CM | POA: Diagnosis not present

## 2022-06-13 ENCOUNTER — Other Ambulatory Visit: Payer: Self-pay

## 2022-06-13 ENCOUNTER — Other Ambulatory Visit: Payer: Medicare Other

## 2022-06-13 DIAGNOSIS — E0789 Other specified disorders of thyroid: Secondary | ICD-10-CM

## 2022-06-13 DIAGNOSIS — D649 Anemia, unspecified: Secondary | ICD-10-CM

## 2022-06-14 ENCOUNTER — Other Ambulatory Visit: Payer: Self-pay | Admitting: Family Medicine

## 2022-06-14 DIAGNOSIS — S22088D Other fracture of T11-T12 vertebra, subsequent encounter for fracture with routine healing: Secondary | ICD-10-CM | POA: Diagnosis not present

## 2022-06-14 DIAGNOSIS — N1832 Chronic kidney disease, stage 3b: Secondary | ICD-10-CM

## 2022-06-14 DIAGNOSIS — I129 Hypertensive chronic kidney disease with stage 1 through stage 4 chronic kidney disease, or unspecified chronic kidney disease: Secondary | ICD-10-CM | POA: Diagnosis not present

## 2022-06-14 DIAGNOSIS — S62101D Fracture of unspecified carpal bone, right wrist, subsequent encounter for fracture with routine healing: Secondary | ICD-10-CM | POA: Diagnosis not present

## 2022-06-14 DIAGNOSIS — E1122 Type 2 diabetes mellitus with diabetic chronic kidney disease: Secondary | ICD-10-CM | POA: Diagnosis not present

## 2022-06-14 DIAGNOSIS — N1831 Chronic kidney disease, stage 3a: Secondary | ICD-10-CM | POA: Diagnosis not present

## 2022-06-14 DIAGNOSIS — D631 Anemia in chronic kidney disease: Secondary | ICD-10-CM | POA: Diagnosis not present

## 2022-06-19 DIAGNOSIS — S62101D Fracture of unspecified carpal bone, right wrist, subsequent encounter for fracture with routine healing: Secondary | ICD-10-CM | POA: Diagnosis not present

## 2022-06-19 DIAGNOSIS — N1831 Chronic kidney disease, stage 3a: Secondary | ICD-10-CM | POA: Diagnosis not present

## 2022-06-19 DIAGNOSIS — E1122 Type 2 diabetes mellitus with diabetic chronic kidney disease: Secondary | ICD-10-CM | POA: Diagnosis not present

## 2022-06-19 DIAGNOSIS — I129 Hypertensive chronic kidney disease with stage 1 through stage 4 chronic kidney disease, or unspecified chronic kidney disease: Secondary | ICD-10-CM | POA: Diagnosis not present

## 2022-06-19 DIAGNOSIS — D631 Anemia in chronic kidney disease: Secondary | ICD-10-CM | POA: Diagnosis not present

## 2022-06-19 DIAGNOSIS — S22088D Other fracture of T11-T12 vertebra, subsequent encounter for fracture with routine healing: Secondary | ICD-10-CM | POA: Diagnosis not present

## 2022-06-26 DIAGNOSIS — D631 Anemia in chronic kidney disease: Secondary | ICD-10-CM | POA: Diagnosis not present

## 2022-06-26 DIAGNOSIS — N1831 Chronic kidney disease, stage 3a: Secondary | ICD-10-CM | POA: Diagnosis not present

## 2022-06-26 DIAGNOSIS — S22088D Other fracture of T11-T12 vertebra, subsequent encounter for fracture with routine healing: Secondary | ICD-10-CM | POA: Diagnosis not present

## 2022-06-26 DIAGNOSIS — I129 Hypertensive chronic kidney disease with stage 1 through stage 4 chronic kidney disease, or unspecified chronic kidney disease: Secondary | ICD-10-CM | POA: Diagnosis not present

## 2022-06-26 DIAGNOSIS — E1122 Type 2 diabetes mellitus with diabetic chronic kidney disease: Secondary | ICD-10-CM | POA: Diagnosis not present

## 2022-06-26 DIAGNOSIS — S62101D Fracture of unspecified carpal bone, right wrist, subsequent encounter for fracture with routine healing: Secondary | ICD-10-CM | POA: Diagnosis not present

## 2022-06-27 ENCOUNTER — Other Ambulatory Visit (INDEPENDENT_AMBULATORY_CARE_PROVIDER_SITE_OTHER): Payer: Medicare Other

## 2022-06-27 DIAGNOSIS — G40909 Epilepsy, unspecified, not intractable, without status epilepticus: Secondary | ICD-10-CM | POA: Diagnosis not present

## 2022-06-27 DIAGNOSIS — I129 Hypertensive chronic kidney disease with stage 1 through stage 4 chronic kidney disease, or unspecified chronic kidney disease: Secondary | ICD-10-CM | POA: Diagnosis not present

## 2022-06-27 DIAGNOSIS — Z85841 Personal history of malignant neoplasm of brain: Secondary | ICD-10-CM | POA: Diagnosis not present

## 2022-06-27 DIAGNOSIS — Z9181 History of falling: Secondary | ICD-10-CM | POA: Diagnosis not present

## 2022-06-27 DIAGNOSIS — H04129 Dry eye syndrome of unspecified lacrimal gland: Secondary | ICD-10-CM | POA: Diagnosis not present

## 2022-06-27 DIAGNOSIS — E1151 Type 2 diabetes mellitus with diabetic peripheral angiopathy without gangrene: Secondary | ICD-10-CM | POA: Diagnosis not present

## 2022-06-27 DIAGNOSIS — E1122 Type 2 diabetes mellitus with diabetic chronic kidney disease: Secondary | ICD-10-CM | POA: Diagnosis not present

## 2022-06-27 DIAGNOSIS — K219 Gastro-esophageal reflux disease without esophagitis: Secondary | ICD-10-CM | POA: Diagnosis not present

## 2022-06-27 DIAGNOSIS — S42302D Unspecified fracture of shaft of humerus, left arm, subsequent encounter for fracture with routine healing: Secondary | ICD-10-CM | POA: Diagnosis not present

## 2022-06-27 DIAGNOSIS — G4700 Insomnia, unspecified: Secondary | ICD-10-CM | POA: Diagnosis not present

## 2022-06-27 DIAGNOSIS — E785 Hyperlipidemia, unspecified: Secondary | ICD-10-CM | POA: Diagnosis not present

## 2022-06-27 DIAGNOSIS — Z7985 Long-term (current) use of injectable non-insulin antidiabetic drugs: Secondary | ICD-10-CM | POA: Diagnosis not present

## 2022-06-27 DIAGNOSIS — F0393 Unspecified dementia, unspecified severity, with mood disturbance: Secondary | ICD-10-CM | POA: Diagnosis not present

## 2022-06-27 DIAGNOSIS — R1310 Dysphagia, unspecified: Secondary | ICD-10-CM | POA: Diagnosis not present

## 2022-06-27 DIAGNOSIS — F329 Major depressive disorder, single episode, unspecified: Secondary | ICD-10-CM | POA: Diagnosis not present

## 2022-06-27 DIAGNOSIS — D631 Anemia in chronic kidney disease: Secondary | ICD-10-CM | POA: Diagnosis not present

## 2022-06-27 DIAGNOSIS — S22088D Other fracture of T11-T12 vertebra, subsequent encounter for fracture with routine healing: Secondary | ICD-10-CM | POA: Diagnosis not present

## 2022-06-27 DIAGNOSIS — D649 Anemia, unspecified: Secondary | ICD-10-CM | POA: Diagnosis not present

## 2022-06-27 DIAGNOSIS — S62101D Fracture of unspecified carpal bone, right wrist, subsequent encounter for fracture with routine healing: Secondary | ICD-10-CM | POA: Diagnosis not present

## 2022-06-27 DIAGNOSIS — M1990 Unspecified osteoarthritis, unspecified site: Secondary | ICD-10-CM | POA: Diagnosis not present

## 2022-06-27 DIAGNOSIS — N1831 Chronic kidney disease, stage 3a: Secondary | ICD-10-CM | POA: Diagnosis not present

## 2022-06-27 LAB — CBC WITH DIFFERENTIAL/PLATELET
Basophils Absolute: 0.1 10*3/uL (ref 0.0–0.2)
Basos: 1 %
EOS (ABSOLUTE): 0.1 10*3/uL (ref 0.0–0.4)
Eos: 1 %
Hematocrit: 32.5 % — ABNORMAL LOW (ref 34.0–46.6)
Hemoglobin: 10.9 g/dL — ABNORMAL LOW (ref 11.1–15.9)
Immature Grans (Abs): 0 10*3/uL (ref 0.0–0.1)
Immature Granulocytes: 0 %
Lymphocytes Absolute: 1.3 10*3/uL (ref 0.7–3.1)
Lymphs: 25 %
MCH: 31 pg (ref 26.6–33.0)
MCHC: 33.5 g/dL (ref 31.5–35.7)
MCV: 92 fL (ref 79–97)
Monocytes Absolute: 0.4 10*3/uL (ref 0.1–0.9)
Monocytes: 8 %
Neutrophils Absolute: 3.3 10*3/uL (ref 1.4–7.0)
Neutrophils: 65 %
Platelets: 199 10*3/uL (ref 150–450)
RBC: 3.52 x10E6/uL — ABNORMAL LOW (ref 3.77–5.28)
RDW: 11.8 % (ref 11.7–15.4)
WBC: 5.1 10*3/uL (ref 3.4–10.8)

## 2022-07-03 DIAGNOSIS — G4700 Insomnia, unspecified: Secondary | ICD-10-CM

## 2022-07-03 DIAGNOSIS — S22088D Other fracture of T11-T12 vertebra, subsequent encounter for fracture with routine healing: Secondary | ICD-10-CM

## 2022-07-03 DIAGNOSIS — F0393 Unspecified dementia, unspecified severity, with mood disturbance: Secondary | ICD-10-CM

## 2022-07-03 DIAGNOSIS — N1831 Chronic kidney disease, stage 3a: Secondary | ICD-10-CM | POA: Diagnosis not present

## 2022-07-03 DIAGNOSIS — E1151 Type 2 diabetes mellitus with diabetic peripheral angiopathy without gangrene: Secondary | ICD-10-CM

## 2022-07-03 DIAGNOSIS — M1990 Unspecified osteoarthritis, unspecified site: Secondary | ICD-10-CM

## 2022-07-03 DIAGNOSIS — E1122 Type 2 diabetes mellitus with diabetic chronic kidney disease: Secondary | ICD-10-CM | POA: Diagnosis not present

## 2022-07-03 DIAGNOSIS — S42302D Unspecified fracture of shaft of humerus, left arm, subsequent encounter for fracture with routine healing: Secondary | ICD-10-CM

## 2022-07-03 DIAGNOSIS — D631 Anemia in chronic kidney disease: Secondary | ICD-10-CM | POA: Diagnosis not present

## 2022-07-03 DIAGNOSIS — S62101D Fracture of unspecified carpal bone, right wrist, subsequent encounter for fracture with routine healing: Secondary | ICD-10-CM

## 2022-07-03 DIAGNOSIS — R1310 Dysphagia, unspecified: Secondary | ICD-10-CM

## 2022-07-03 DIAGNOSIS — I129 Hypertensive chronic kidney disease with stage 1 through stage 4 chronic kidney disease, or unspecified chronic kidney disease: Secondary | ICD-10-CM | POA: Diagnosis not present

## 2022-07-03 DIAGNOSIS — F329 Major depressive disorder, single episode, unspecified: Secondary | ICD-10-CM

## 2022-07-05 DIAGNOSIS — D631 Anemia in chronic kidney disease: Secondary | ICD-10-CM | POA: Diagnosis not present

## 2022-07-05 DIAGNOSIS — S22088D Other fracture of T11-T12 vertebra, subsequent encounter for fracture with routine healing: Secondary | ICD-10-CM | POA: Diagnosis not present

## 2022-07-05 DIAGNOSIS — S62101D Fracture of unspecified carpal bone, right wrist, subsequent encounter for fracture with routine healing: Secondary | ICD-10-CM | POA: Diagnosis not present

## 2022-07-05 DIAGNOSIS — I129 Hypertensive chronic kidney disease with stage 1 through stage 4 chronic kidney disease, or unspecified chronic kidney disease: Secondary | ICD-10-CM | POA: Diagnosis not present

## 2022-07-05 DIAGNOSIS — E1122 Type 2 diabetes mellitus with diabetic chronic kidney disease: Secondary | ICD-10-CM | POA: Diagnosis not present

## 2022-07-05 DIAGNOSIS — N1831 Chronic kidney disease, stage 3a: Secondary | ICD-10-CM | POA: Diagnosis not present

## 2022-07-09 ENCOUNTER — Other Ambulatory Visit: Payer: Self-pay | Admitting: Family Medicine

## 2022-07-09 ENCOUNTER — Other Ambulatory Visit (HOSPITAL_COMMUNITY): Payer: Self-pay

## 2022-07-09 DIAGNOSIS — E1142 Type 2 diabetes mellitus with diabetic polyneuropathy: Secondary | ICD-10-CM

## 2022-07-09 MED ORDER — TRULICITY 1.5 MG/0.5ML ~~LOC~~ SOAJ
1.5000 mg | SUBCUTANEOUS | 0 refills | Status: DC
  Filled 2022-07-09: qty 2, 28d supply, fill #0

## 2022-07-25 ENCOUNTER — Ambulatory Visit
Admission: RE | Admit: 2022-07-25 | Discharge: 2022-07-25 | Disposition: A | Discharge status: Home / Self Care | Payer: Medicare Other | Source: Ambulatory Visit | Attending: Physician Assistant | Admitting: Physician Assistant

## 2022-07-25 DIAGNOSIS — Z1231 Encounter for screening mammogram for malignant neoplasm of breast: Secondary | ICD-10-CM | POA: Diagnosis not present

## 2022-07-31 ENCOUNTER — Other Ambulatory Visit: Payer: Self-pay | Admitting: Physician Assistant

## 2022-07-31 DIAGNOSIS — R928 Other abnormal and inconclusive findings on diagnostic imaging of breast: Secondary | ICD-10-CM

## 2022-08-16 ENCOUNTER — Other Ambulatory Visit: Payer: Medicare Other

## 2022-08-23 DIAGNOSIS — R7989 Other specified abnormal findings of blood chemistry: Secondary | ICD-10-CM | POA: Diagnosis not present

## 2022-08-23 DIAGNOSIS — E119 Type 2 diabetes mellitus without complications: Secondary | ICD-10-CM | POA: Diagnosis not present

## 2022-08-23 DIAGNOSIS — K219 Gastro-esophageal reflux disease without esophagitis: Secondary | ICD-10-CM | POA: Diagnosis not present

## 2022-09-05 ENCOUNTER — Ambulatory Visit (INDEPENDENT_AMBULATORY_CARE_PROVIDER_SITE_OTHER): Payer: Medicare Other | Admitting: Physician Assistant

## 2022-09-05 ENCOUNTER — Encounter: Payer: Self-pay | Admitting: Physician Assistant

## 2022-09-05 ENCOUNTER — Other Ambulatory Visit: Payer: Self-pay | Admitting: Physician Assistant

## 2022-09-05 VITALS — BP 116/66 | HR 84 | Temp 97.3°F | Ht 64.0 in | Wt 105.2 lb

## 2022-09-05 DIAGNOSIS — F411 Generalized anxiety disorder: Secondary | ICD-10-CM

## 2022-09-05 DIAGNOSIS — E118 Type 2 diabetes mellitus with unspecified complications: Secondary | ICD-10-CM | POA: Diagnosis not present

## 2022-09-05 DIAGNOSIS — F0393 Unspecified dementia, unspecified severity, with mood disturbance: Secondary | ICD-10-CM

## 2022-09-05 DIAGNOSIS — R899 Unspecified abnormal finding in specimens from other organs, systems and tissues: Secondary | ICD-10-CM

## 2022-09-05 DIAGNOSIS — E119 Type 2 diabetes mellitus without complications: Secondary | ICD-10-CM | POA: Diagnosis not present

## 2022-09-05 DIAGNOSIS — E782 Mixed hyperlipidemia: Secondary | ICD-10-CM | POA: Diagnosis not present

## 2022-09-05 DIAGNOSIS — D6489 Other specified anemias: Secondary | ICD-10-CM | POA: Diagnosis not present

## 2022-09-05 DIAGNOSIS — G301 Alzheimer's disease with late onset: Secondary | ICD-10-CM

## 2022-09-05 DIAGNOSIS — F0394 Unspecified dementia, unspecified severity, with anxiety: Secondary | ICD-10-CM

## 2022-09-05 NOTE — Progress Notes (Addendum)
Established Patient Office Visit  Subjective:  Patient ID: Donna Moran, female    DOB: [birth year]  Age: 67 y.o. MRN: [id number]  CC:  Chief Complaint  Patient presents with   Medical Management of Chronic Issues    HPI Donna Moran presents for chronic health management --- pt has not been seen in office for over 1 and 1/2 years.  She had a fracture of her left shoulder in 2022 and was discharged to home for 4 weeks and had a demise in her health then admitted to [facility].  She was a resident there for  14 months but was discharged a few months ago She is currently doing well at home with her husband (per her daughter who accompanies her today)  Pt with history of NIDDM - has had for several years - unsure exactly.  States she is checking her glucose and it has been within fasting of 120s-130s.  She is taking trulicity weekly.  She is overdue for eye appt and will schedule Is due for labwork- last hgb A1c was stable  Pt with history of mild depression and anxiety as well as early onset of dementia.  She is currently on citalopram 10mg  , desyrel 100mg  qhs and aricept 10mg  Daughter states she has been on those medications throughout her stay at Rehab  Pt was referred to GI for screening colonoscopy but has opted not to have done because of problems with anesthesia - while there she was supposed to address her rectal pressure and anemia but another daughter took her to appt and did not bring up those issues Pt is due to repeat cbc and will also give hemoccult cards - will wait on those results and then plan to reschedule with GI for further evaluation  Past Medical History:  Diagnosis Date   Diabetes mellitus without complication (HCC)    History of blood transfusion    Hyperlipidemia     Past Surgical History:  Procedure Laterality Date   CATARACT EXTRACTION Bilateral 04/2013-05/2013    Family History  Problem Relation Age of Onset   Cancer Neg Hx     Diabetes Neg Hx    Stroke Neg Hx    Breast cancer Neg Hx     Social History   Socioeconomic History   Marital status: Married    Spouse name: Not on file   Number of children: 2   Years of education: Not on file   Highest education level: Not on file  Occupational History   Occupation: Unemployed    Employer: NOT EMPLOYED  Tobacco Use   Smoking status: Former    Types: Cigarettes    Quit date: 2012    Years since quitting: 12.3   Smokeless tobacco: Never  Substance and Sexual Activity   Alcohol use: No   Drug use: No   Sexual activity: Yes  Other Topics Concern   Not on file  Social History Narrative   Regular exercise-no   Caffeine Use-yes   Social Determinants of Health   Financial Resource Strain: Not on file  Food Insecurity: Not on file  Transportation Needs: Not on file  Physical Activity: Not on file  Stress: Not on file  Social Connections: Not on file  Intimate Partner Violence: Not on file     Current Outpatient Medications:    citalopram (CELEXA) 10 MG tablet, Take 1 tablet (10 mg total) by mouth daily., Disp: 90 tablet, Rfl: 1   donepezil (ARICEPT) 10 MG tablet,  Take 1 tablet (10 mg total) by mouth at bedtime., Disp: 90 tablet, Rfl: 1   Dulaglutide (TRULICITY) 1.5 MG/0.5ML SOPN, Inject 1.5 mg into the skin once a week., Disp: 6 mL, Rfl: 0   traZODone (DESYREL) 100 MG tablet, Take 1 tablet (100 mg total) by mouth at bedtime., Disp: 90 tablet, Rfl: 1   Allergies  Allergen Reactions   Oxycodone Nausea Only   Aspirin Nausea Only    Can take coated aspirin   CONSTITUTIONAL: Negative for chills, fatigue, fever,  E/N/T: Negative for ear pain, nasal congestion and sore throat.  CARDIOVASCULAR: Negative for chest pain, dizziness, palpitations and pedal edema.  RESPIRATORY: Negative for recent cough and dyspnea.  GASTROINTESTINAL: see HPI MSK: Negative for arthralgias and myalgias.  INTEGUMENTARY: Negative for rash.  NEUROLOGICAL: Negative for  dizziness and headaches.  PSYCHIATRIC: Negative for sleep disturbance and to question depression screen.  Negative for depression, negative for anhedonia.        Objective:  PHYSICAL EXAM:   VS: BP 116/66 (BP Location: Left Arm, Patient Position: Sitting, Cuff Size: Normal)   Pulse 84   Temp (!) 97.3 F (36.3 C) (Temporal)   Ht 5\' 4"  (1.626 m)   Wt 105 lb 3.2 oz (47.7 kg)   SpO2 96%   BMI 18.06 kg/m   GEN: Well nourished, well developed, in no acute distress  Cardiac: RRR; no murmurs, rubs, or gallops,no edema - Respiratory:  normal respiratory rate and pattern with no distress - normal breath sounds with no rales, rhonchi, wheezes or rubs Skin: warm and dry, no rash  Psych: euthymic mood, appropriate affect and demeanor   Health Maintenance Due  Topic Date Due   Medicare Annual Wellness (AWV)  Never done   Zoster Vaccines- Shingrix (1 of 2) Never done   OPHTHALMOLOGY EXAM  09/28/2020   COVID-19 Vaccine (3 - 2023-24 season) 12/15/2021    There are no preventive care reminders to display for this patient.  Lab Results  Component Value Date   TSH 0.046 (L) 06/04/2022   Lab Results  Component Value Date   WBC 5.1 06/27/2022   HGB 10.9 (L) 06/27/2022   HCT 32.5 (L) 06/27/2022   MCV 92 06/27/2022   PLT 199 06/27/2022   Lab Results  Component Value Date   NA 146 (H) 06/04/2022   K 4.9 06/04/2022   CO2 25 06/04/2022   GLUCOSE 140 (H) 06/04/2022   BUN 29 (H) 06/04/2022   CREATININE 1.53 (H) 06/04/2022   BILITOT 0.3 06/04/2022   ALKPHOS 64 06/04/2022   AST 16 06/04/2022   ALT 20 06/04/2022   PROT 6.6 06/04/2022   ALBUMIN 4.6 06/04/2022   CALCIUM 10.1 06/04/2022   EGFR 37 (L) 06/04/2022   GFR 71.18 01/26/2015   Lab Results  Component Value Date   CHOL 200 (H) 12/27/2020   Lab Results  Component Value Date   HDL 71 12/27/2020   Lab Results  Component Value Date   LDLCALC 111 (H) 12/27/2020   Lab Results  Component Value Date   TRIG 100 12/27/2020    Lab Results  Component Value Date   CHOLHDL 2.8 12/27/2020   Lab Results  Component Value Date   HGBA1C 7.2 (H) 06/04/2022      Assessment & Plan:   Problem List Items Addressed This Visit       Nervous and Auditory   Dementia (HCC) - Primary Continue meds     Other   Generalized anxiety disorder Continue med  Mixed hyperlipidemia   Relevant Orders   Lipid panel Watch diet   Other Visit Diagnoses     Abnormal laboratory test       Relevant Orders   Thyroid Panel With TSH   Controlled type 2 diabetes mellitus without complication, without long-term current use of insulin (HCC)       Relevant Orders   CBC with Differential/Platelet   Comprehensive metabolic panel   Hemoglobin A1c   Microalbumin / creatinine urine ratio   Anemia  Labwork pending Hemoccult cards given       No orders of the defined types were placed in this encounter.   Follow-up: Return in about 3 months (around 12/06/2022).    SARA R Jerrit Horen, PA-C

## 2022-09-06 ENCOUNTER — Other Ambulatory Visit (HOSPITAL_COMMUNITY): Payer: Self-pay

## 2022-09-06 DIAGNOSIS — E119 Type 2 diabetes mellitus without complications: Secondary | ICD-10-CM | POA: Insufficient documentation

## 2022-09-06 DIAGNOSIS — Z1211 Encounter for screening for malignant neoplasm of colon: Secondary | ICD-10-CM | POA: Insufficient documentation

## 2022-09-06 DIAGNOSIS — Z1231 Encounter for screening mammogram for malignant neoplasm of breast: Secondary | ICD-10-CM | POA: Insufficient documentation

## 2022-09-06 LAB — COMPREHENSIVE METABOLIC PANEL
ALT: 18 IU/L (ref 0–32)
AST: 15 IU/L (ref 0–40)
Albumin/Globulin Ratio: 2 (ref 1.2–2.2)
Albumin: 4.3 g/dL (ref 3.9–4.9)
Alkaline Phosphatase: 56 IU/L (ref 44–121)
BUN/Creatinine Ratio: 18 (ref 12–28)
BUN: 25 mg/dL (ref 8–27)
Bilirubin Total: 0.3 mg/dL (ref 0.0–1.2)
CO2: 24 mmol/L (ref 20–29)
Calcium: 9.7 mg/dL (ref 8.7–10.3)
Chloride: 104 mmol/L (ref 96–106)
Creatinine, Ser: 1.41 mg/dL — ABNORMAL HIGH (ref 0.57–1.00)
Globulin, Total: 2.1 g/dL (ref 1.5–4.5)
Glucose: 156 mg/dL — ABNORMAL HIGH (ref 70–99)
Potassium: 4.8 mmol/L (ref 3.5–5.2)
Sodium: 142 mmol/L (ref 134–144)
Total Protein: 6.4 g/dL (ref 6.0–8.5)
eGFR: 40 mL/min/{1.73_m2} — ABNORMAL LOW (ref >59)

## 2022-09-06 LAB — LIPID PANEL
Chol/HDL Ratio: 2.7 ratio (ref 0.0–4.4)
Cholesterol, Total: 231 mg/dL — ABNORMAL HIGH (ref 100–199)
HDL: 87 mg/dL (ref >39)
LDL Chol Calc (NIH): 129 mg/dL — ABNORMAL HIGH (ref 0–99)
Triglycerides: 85 mg/dL (ref 0–149)
VLDL Cholesterol Cal: 15 mg/dL (ref 5–40)

## 2022-09-06 LAB — CBC WITH DIFFERENTIAL/PLATELET
Basophils Absolute: 0 10*3/uL (ref 0.0–0.2)
Basos: 1 %
EOS (ABSOLUTE): 0.1 10*3/uL (ref 0.0–0.4)
Eos: 2 %
Hematocrit: 33.1 % — ABNORMAL LOW (ref 34.0–46.6)
Hemoglobin: 10.8 g/dL — ABNORMAL LOW (ref 11.1–15.9)
Immature Grans (Abs): 0 10*3/uL (ref 0.0–0.1)
Immature Granulocytes: 0 %
Lymphocytes Absolute: 1.5 10*3/uL (ref 0.7–3.1)
Lymphs: 23 %
MCH: 30.1 pg (ref 26.6–33.0)
MCHC: 32.6 g/dL (ref 31.5–35.7)
MCV: 92 fL (ref 79–97)
Monocytes Absolute: 0.5 10*3/uL (ref 0.1–0.9)
Monocytes: 7 %
Neutrophils Absolute: 4.3 10*3/uL (ref 1.4–7.0)
Neutrophils: 67 %
Platelets: 205 10*3/uL (ref 150–450)
RBC: 3.59 x10E6/uL — ABNORMAL LOW (ref 3.77–5.28)
RDW: 11.8 % (ref 11.7–15.4)
WBC: 6.5 10*3/uL (ref 3.4–10.8)

## 2022-09-06 LAB — MICROALBUMIN / CREATININE URINE RATIO
Creatinine, Urine: 81.4 mg/dL
Microalb/Creat Ratio: 1149 mg/g creat — ABNORMAL HIGH (ref 0–29)
Microalbumin, Urine: 935 ug/mL

## 2022-09-06 LAB — HEMOGLOBIN A1C
Est. average glucose Bld gHb Est-mCnc: 169 mg/dL
Hgb A1c MFr Bld: 7.5 % — ABNORMAL HIGH (ref 4.8–5.6)

## 2022-09-06 LAB — CARDIOVASCULAR RISK ASSESSMENT

## 2022-09-06 MED ORDER — TRULICITY 3 MG/0.5ML ~~LOC~~ SOAJ
3.0000 mg | SUBCUTANEOUS | 2 refills | Status: DC
Start: 2022-09-06 — End: 2022-09-11
  Filled 2022-09-06: qty 2, 28d supply, fill #0

## 2022-09-06 NOTE — Addendum Note (Signed)
Addended by: Marianne Sofia on: 09/06/2022 01:44 PM   Modules accepted: Orders

## 2022-09-07 ENCOUNTER — Other Ambulatory Visit (HOSPITAL_COMMUNITY): Payer: Self-pay

## 2022-09-11 ENCOUNTER — Other Ambulatory Visit: Payer: Self-pay

## 2022-09-11 DIAGNOSIS — E119 Type 2 diabetes mellitus without complications: Secondary | ICD-10-CM

## 2022-09-11 DIAGNOSIS — E118 Type 2 diabetes mellitus with unspecified complications: Secondary | ICD-10-CM | POA: Insufficient documentation

## 2022-09-11 DIAGNOSIS — R899 Unspecified abnormal finding in specimens from other organs, systems and tissues: Secondary | ICD-10-CM | POA: Insufficient documentation

## 2022-09-11 LAB — IRON,TIBC AND FERRITIN PANEL
Ferritin: 61 ng/mL (ref 15–150)
Iron Saturation: 21 % (ref 15–55)
Iron: 58 ug/dL (ref 27–139)
Total Iron Binding Capacity: 282 ug/dL (ref 250–450)
UIBC: 224 ug/dL (ref 118–369)

## 2022-09-11 LAB — SPECIMEN STATUS REPORT

## 2022-09-11 MED ORDER — TRULICITY 3 MG/0.5ML ~~LOC~~ SOAJ
3.0000 mg | SUBCUTANEOUS | 2 refills | Status: DC
Start: 2022-09-11 — End: 2022-12-06

## 2022-09-11 NOTE — Telephone Encounter (Signed)
PATIENT'S SISTER CALLED STATING THAT RX WAS SENT TO WRONG PHARMACY AND NEEDS IT SENT TO [facility] IN [place]. PLEASE RE-SEND

## 2022-09-12 ENCOUNTER — Other Ambulatory Visit (HOSPITAL_COMMUNITY): Payer: Self-pay

## 2022-11-29 ENCOUNTER — Other Ambulatory Visit: Payer: Self-pay | Admitting: Physician Assistant

## 2022-11-29 DIAGNOSIS — F0280 Dementia in other diseases classified elsewhere without behavioral disturbance: Secondary | ICD-10-CM

## 2022-11-29 DIAGNOSIS — F320 Major depressive disorder, single episode, mild: Secondary | ICD-10-CM

## 2022-12-06 ENCOUNTER — Other Ambulatory Visit: Payer: Self-pay | Admitting: Physician Assistant

## 2022-12-06 DIAGNOSIS — E119 Type 2 diabetes mellitus without complications: Secondary | ICD-10-CM

## 2022-12-12 ENCOUNTER — Ambulatory Visit: Payer: Medicare Other | Admitting: Physician Assistant

## 2022-12-12 VITALS — BP 100/62 | HR 94 | Temp 97.9°F | Ht 64.0 in | Wt 100.0 lb

## 2022-12-12 DIAGNOSIS — E119 Type 2 diabetes mellitus without complications: Secondary | ICD-10-CM

## 2022-12-12 DIAGNOSIS — E1142 Type 2 diabetes mellitus with diabetic polyneuropathy: Secondary | ICD-10-CM | POA: Diagnosis not present

## 2022-12-12 DIAGNOSIS — E782 Mixed hyperlipidemia: Secondary | ICD-10-CM | POA: Diagnosis not present

## 2022-12-12 NOTE — Progress Notes (Signed)
Subjective:  Patient ID: Donna Moran, female    DOB: 06-Mar-1956  Age: 67 y.o. MRN: [id number]  Chief Complaint  Patient presents with   Diabetes   Medical Management of Chronic Issues    HPI:   Malyna Renn presents for chronic health management ---accompanied by her Daughter today   Pt with history of NIDDM - has had for several years - unsure exactly. She is taking trulicity 3mg  weekly.  She is overdue for eye appt and will schedule. Last A1C 7.5   Mild depression and anxiety as well as early onset of dementia.  She is currently on citalopram 10mg  , desyrel 100mg  qhs and aricept 10mg         12/12/2022    1:42 PM 09/05/2022    1:57 PM 06/04/2022    2:35 PM 12/27/2020   11:08 AM 09/14/2020    2:50 PM  Depression screen PHQ 2/9  Decreased Interest 1 0 0 0 3  Down, Depressed, Hopeless 0 0 0 0 1  PHQ - 2 Score 1 0 0 0 4  Altered sleeping 0 0 0 2 3  Tired, decreased energy 1 0 0 2 3  Change in appetite 0 0 0 0 3  Feeling bad or failure about yourself  0 0 0  0  Trouble concentrating 0 0 0 1 3  Moving slowly or fidgety/restless 0 0 0 1 2  Suicidal thoughts 0 0 0 1 0  PHQ-9 Score 2 0 0 7 18  Difficult doing work/chores Not difficult at all Not difficult at all Not difficult at all Very difficult Somewhat difficult        12/12/2022    1:42 PM  Fall Risk   Falls in the past year? 0  Number falls in past yr: 0  Injury with Fall? 0  Risk for fall due to : No Fall Risks  Follow up Falls evaluation completed    Patient Care Team: Marianne Sofia, Cordelia Poche as PCP - General (Physician Assistant)   Review of Systems  Constitutional:  Negative for chills, fatigue and fever.  HENT:  Negative for congestion, ear pain, rhinorrhea and sore throat.   Respiratory:  Negative for cough and shortness of breath.   Cardiovascular:  Negative for chest pain.  Gastrointestinal:  Negative for abdominal pain, constipation, diarrhea, nausea and vomiting.  Genitourinary:  Negative for dysuria  and urgency.  Musculoskeletal:  Negative for back pain and myalgias.  Neurological:  Negative for dizziness, weakness, light-headedness and headaches.  Psychiatric/Behavioral:  Negative for dysphoric mood. The patient is not nervous/anxious.     Current Outpatient Medications on File Prior to Visit  Medication Sig Dispense Refill   citalopram (CELEXA) 10 MG tablet TAKE 1 TABLET(10 MG) BY MOUTH DAILY 90 tablet 1   donepezil (ARICEPT) 10 MG tablet TAKE 1 TABLET(10 MG) BY MOUTH AT BEDTIME 90 tablet 1   traZODone (DESYREL) 100 MG tablet TAKE 1 TABLET(100 MG) BY MOUTH AT BEDTIME 90 tablet 1   TRULICITY 3 MG/0.5ML SOPN ADMINISTER 3 MG UNDER THE SKIN 1 TIME A WEEK AS DIRECTED 2 mL 2   No current facility-administered medications on file prior to visit.   Past Medical History:  Diagnosis Date   Diabetes mellitus without complication (HCC)    History of blood transfusion    Hyperlipidemia    Past Surgical History:  Procedure Laterality Date   CATARACT EXTRACTION Bilateral 04/2013-05/2013    Family History  Problem Relation Age of Onset   Cancer Neg  Hx    Diabetes Neg Hx    Stroke Neg Hx    Breast cancer Neg Hx    Social History   Socioeconomic History   Marital status: Married    Spouse name: Not on file   Number of children: 2   Years of education: Not on file   Highest education level: Not on file  Occupational History   Occupation: Unemployed    Employer: NOT EMPLOYED  Tobacco Use   Smoking status: Former    Current packs/day: 0.00    Types: Cigarettes    Quit date: 2012    Years since quitting: 12.6   Smokeless tobacco: Never  Substance and Sexual Activity   Alcohol use: No   Drug use: No   Sexual activity: Yes  Other Topics Concern   Not on file  Social History Narrative   Regular exercise-no   Caffeine Use-yes   Social Determinants of Health   Financial Resource Strain: Not on file  Food Insecurity: Not on file  Transportation Needs: No Transportation Needs  (06/20/2020)   Received from [facility], [facility]   PRAPARE - Transportation    In the past 12 months, has lack of transportation kept you from medical appointments or from getting medications?: No    Lack of Transportation (Non-Medical): No  Physical Activity: Not on file  Stress: Not on file  Social Connections: Not on file    Objective:  BP 100/62 (BP Location: Right Arm, Patient Position: Sitting, Cuff Size: Small)   Pulse 94   Temp 97.9 F (36.6 C) (Temporal)   Ht 5\' 4"  (1.626 m)   Wt 100 lb (45.4 kg)   SpO2 97%   BMI 17.16 kg/m      12/12/2022    1:37 PM 09/05/2022    1:55 PM 06/04/2022    2:27 PM  BP/Weight  Systolic BP 100 116 122  Diastolic BP 62 66 60  Wt. (Lbs) 100 105.2 107  BMI 17.16 kg/m2 18.06 kg/m2 18.37 kg/m2    Physical Exam Vitals reviewed.  Constitutional:      Appearance: Normal appearance. She is normal weight.  Neck:     Vascular: No carotid bruit.  Cardiovascular:     Rate and Rhythm: Normal rate and regular rhythm.     Heart sounds: Normal heart sounds.  Pulmonary:     Effort: Pulmonary effort is normal. No respiratory distress.     Breath sounds: Normal breath sounds.  Abdominal:     General: Abdomen is flat. Bowel sounds are normal.     Palpations: Abdomen is soft.     Tenderness: There is no abdominal tenderness.  Neurological:     Mental Status: She is alert and oriented to person, place, and time.  Psychiatric:        Mood and Affect: Mood normal.        Behavior: Behavior normal.     Diabetic Foot Exam - Simple   No data filed      Lab Results  Component Value Date   WBC 6.1 12/12/2022   HGB 11.2 12/12/2022   HCT 33.4 (L) 12/12/2022   PLT 228 12/12/2022   GLUCOSE 129 (H) 12/12/2022   CHOL 248 (H) 12/12/2022   TRIG 120 12/12/2022   HDL 79 12/12/2022   LDLCALC 148 (H) 12/12/2022   ALT 13 12/12/2022   AST 15 12/12/2022   NA 142 12/12/2022   K 4.3 12/12/2022   CL 104 12/12/2022  CREATININE 1.37 (H) 12/12/2022   BUN 21 12/12/2022   CO2 24 12/12/2022   TSH 0.046 (L) 06/04/2022   HGBA1C 7.1 (H) 12/12/2022   MICROALBUR 14.5 (H) 01/26/2015      Assessment & Plan:    Controlled type 2 diabetes mellitus without complication, without long-term current use of insulin (HCC) Assessment & Plan: Controlled Continue monitoring diet and exercise Continue taking Trulicity 3 as directed Labs drawn today  Orders: -     Hemoglobin A1c  Type 2 diabetes mellitus with diabetic polyneuropathy, unspecified whether long term insulin use (HCC) -     CBC with Differential/Platelet -     Comprehensive metabolic panel -     Hemoglobin A1c  Mixed hyperlipidemia Assessment & Plan: Well controlled.  Continue to work on eating a healthy diet and exercise.  Labs drawn today.   No major side effects reported, and no issues with compliance. Will adjust treatment as needed depending on labs Lab Results  Component Value Date   LDLCALC 148 (H) 12/12/2022     Orders: -     Lipid panel  Other orders -     [facility] CKD Program     No orders of the defined types were placed in this encounter.   Orders Placed This Encounter  Procedures   CBC with Differential/Platelet   Comprehensive metabolic panel   Hemoglobin A1c   Lipid panel   [facility] CKD Program     Follow-up: Return in about 6 months (around 06/14/2023) for Chronic, Huston Foley.   I,Katherina A Bramblett,acting as a scribe for [employer], PA.,have documented all relevant documentation on the behalf of Langley Gauss, PA,as directed by  Langley Gauss, PA while in the presence of Langley Gauss, [place].   An After Visit Summary was printed and given to the patient.  Langley Gauss, [place] [facility] [phone]

## 2022-12-13 LAB — LIPID PANEL
Chol/HDL Ratio: 3.1 ratio (ref 0.0–4.4)
Cholesterol, Total: 248 mg/dL — ABNORMAL HIGH (ref 100–199)
HDL: 79 mg/dL (ref >39)
LDL Chol Calc (NIH): 148 mg/dL — ABNORMAL HIGH (ref 0–99)
Triglycerides: 120 mg/dL (ref 0–149)
VLDL Cholesterol Cal: 21 mg/dL (ref 5–40)

## 2022-12-13 LAB — COMPREHENSIVE METABOLIC PANEL
ALT: 13 IU/L (ref 0–32)
AST: 15 IU/L (ref 0–40)
Albumin: 4.6 g/dL (ref 3.9–4.9)
Alkaline Phosphatase: 62 IU/L (ref 44–121)
BUN/Creatinine Ratio: 15 (ref 12–28)
BUN: 21 mg/dL (ref 8–27)
Bilirubin Total: 0.4 mg/dL (ref 0.0–1.2)
CO2: 24 mmol/L (ref 20–29)
Calcium: 9.9 mg/dL (ref 8.7–10.3)
Chloride: 104 mmol/L (ref 96–106)
Creatinine, Ser: 1.37 mg/dL — ABNORMAL HIGH (ref 0.57–1.00)
Globulin, Total: 2.1 g/dL (ref 1.5–4.5)
Glucose: 129 mg/dL — ABNORMAL HIGH (ref 70–99)
Potassium: 4.3 mmol/L (ref 3.5–5.2)
Sodium: 142 mmol/L (ref 134–144)
Total Protein: 6.7 g/dL (ref 6.0–8.5)
eGFR: 42 mL/min/{1.73_m2} — ABNORMAL LOW (ref >59)

## 2022-12-13 LAB — CBC WITH DIFFERENTIAL/PLATELET
Basophils Absolute: 0.1 10*3/uL (ref 0.0–0.2)
Basos: 1 %
EOS (ABSOLUTE): 0.1 10*3/uL (ref 0.0–0.4)
Eos: 2 %
Hematocrit: 33.4 % — ABNORMAL LOW (ref 34.0–46.6)
Hemoglobin: 11.2 g/dL (ref 11.1–15.9)
Immature Grans (Abs): 0 10*3/uL (ref 0.0–0.1)
Immature Granulocytes: 0 %
Lymphocytes Absolute: 1.6 10*3/uL (ref 0.7–3.1)
Lymphs: 26 %
MCH: 29.9 pg (ref 26.6–33.0)
MCHC: 33.5 g/dL (ref 31.5–35.7)
MCV: 89 fL (ref 79–97)
Monocytes Absolute: 0.4 10*3/uL (ref 0.1–0.9)
Monocytes: 7 %
Neutrophils Absolute: 3.9 10*3/uL (ref 1.4–7.0)
Neutrophils: 64 %
Platelets: 228 10*3/uL (ref 150–450)
RBC: 3.74 x10E6/uL — ABNORMAL LOW (ref 3.77–5.28)
RDW: 12.1 % (ref 11.7–15.4)
WBC: 6.1 10*3/uL (ref 3.4–10.8)

## 2022-12-13 LAB — LITHOLINK CKD PROGRAM

## 2022-12-13 LAB — HEMOGLOBIN A1C
Est. average glucose Bld gHb Est-mCnc: 157 mg/dL
Hgb A1c MFr Bld: 7.1 % — ABNORMAL HIGH (ref 4.8–5.6)

## 2022-12-13 NOTE — Assessment & Plan Note (Signed)
Controlled Continue monitoring diet and exercise Continue taking Trulicity 3 as directed Labs drawn today

## 2022-12-13 NOTE — Assessment & Plan Note (Signed)
Well controlled.  Continue to work on eating a healthy diet and exercise.  Labs drawn today.   No major side effects reported, and no issues with compliance. Will adjust treatment as needed depending on labs Lab Results  Component Value Date   LDLCALC 148 (H) 12/12/2022

## 2022-12-14 ENCOUNTER — Telehealth: Payer: Self-pay

## 2022-12-14 ENCOUNTER — Encounter: Payer: Self-pay | Admitting: Physician Assistant

## 2022-12-14 DIAGNOSIS — E1142 Type 2 diabetes mellitus with diabetic polyneuropathy: Secondary | ICD-10-CM

## 2022-12-14 NOTE — Telephone Encounter (Addendum)
Left patient a message to call office with any question. Lab order is in.   ----- Message from Langley Gauss sent at 12/13/2022  3:13 PM EDT ----- Regarding: Micro Can we call her and have her come back in to give [place] a urine sample? Her Micro was extremely high and we wanted to see if it had changed since her last appointment. She was unable to give [place] a urine at her visit but we need to make sure she is not still spilling protein in her urine

## 2023-04-02 ENCOUNTER — Other Ambulatory Visit: Payer: Self-pay | Admitting: Physician Assistant

## 2023-04-02 DIAGNOSIS — E119 Type 2 diabetes mellitus without complications: Secondary | ICD-10-CM

## 2023-06-05 ENCOUNTER — Other Ambulatory Visit: Payer: Self-pay | Admitting: Family Medicine

## 2023-06-05 DIAGNOSIS — F0280 Dementia in other diseases classified elsewhere without behavioral disturbance: Secondary | ICD-10-CM

## 2023-06-05 DIAGNOSIS — F320 Major depressive disorder, single episode, mild: Secondary | ICD-10-CM

## 2023-06-18 ENCOUNTER — Encounter: Payer: Self-pay | Admitting: Physician Assistant

## 2023-06-18 ENCOUNTER — Ambulatory Visit (INDEPENDENT_AMBULATORY_CARE_PROVIDER_SITE_OTHER): Payer: Medicare Other | Admitting: Physician Assistant

## 2023-06-18 VITALS — BP 122/80 | HR 95 | Temp 97.7°F | Resp 18 | Ht 64.0 in | Wt 101.0 lb

## 2023-06-18 DIAGNOSIS — N959 Unspecified menopausal and perimenopausal disorder: Secondary | ICD-10-CM

## 2023-06-18 DIAGNOSIS — E119 Type 2 diabetes mellitus without complications: Secondary | ICD-10-CM | POA: Diagnosis not present

## 2023-06-18 DIAGNOSIS — Z23 Encounter for immunization: Secondary | ICD-10-CM

## 2023-06-18 DIAGNOSIS — E559 Vitamin D deficiency, unspecified: Secondary | ICD-10-CM

## 2023-06-18 DIAGNOSIS — R928 Other abnormal and inconclusive findings on diagnostic imaging of breast: Secondary | ICD-10-CM

## 2023-06-18 DIAGNOSIS — F411 Generalized anxiety disorder: Secondary | ICD-10-CM | POA: Diagnosis not present

## 2023-06-18 DIAGNOSIS — N1832 Chronic kidney disease, stage 3b: Secondary | ICD-10-CM

## 2023-06-18 DIAGNOSIS — G301 Alzheimer's disease with late onset: Secondary | ICD-10-CM | POA: Diagnosis not present

## 2023-06-18 DIAGNOSIS — F02A0 Dementia in other diseases classified elsewhere, mild, without behavioral disturbance, psychotic disturbance, mood disturbance, and anxiety: Secondary | ICD-10-CM

## 2023-06-18 DIAGNOSIS — E782 Mixed hyperlipidemia: Secondary | ICD-10-CM

## 2023-06-18 NOTE — Progress Notes (Signed)
 Established Patient Office Visit  Subjective:  Patient ID: Donna Moran, female    DOB: [birth year]  Age: 68 y.o. MRN: [id number]  CC:  No chief complaint on file.   HPI [name] presents for chronic health management --- pt has not been seen by me in about a year.  She had a fracture of her left shoulder in 2022 and was discharged to home for 4 weeks and had a demise in her health then admitted to [facility].  She was a resident there for over a year and then discharged last year.   She is currently doing well at home with her husband (per her daughter who accompanies her today)  Pt with history of NIDDM - has had for several years - unsure exactly.  States she is checking her glucose and it has been within fasting of 120s-130s.  She is taking trulicity 3mg  weekly.  She is overdue for eye appt and will schedule Is due for labwork  Pt with history of mild depression and anxiety as well as early onset of dementia.  She is currently on citalopram 10mg  , desyrel 100mg  qhs and aricept 10mg  Daughter states she has been on those medications throughout her stay at Rehab and for the past year has been stable on these medications  Pt had an abnormal mammogram last spring and was supposed to have gotten a diagnostic mammogram but never followed through with it - will schedule this along with a dexa scan which she has not had in many years  Pt with GERD - stable on omeprazole 20mg  qd   Pt with known chronic kidney disease - currently does not follow with urology --- upon questioning pt about her medications she has been taking multiple advil daily for the past several months for chronic shoulder and back pain - recommend to stop the oral medication and she does have voltaren gel at home and would advise to use that instead  Pt would like flu shot today  Past Medical History:  Diagnosis Date   Diabetes mellitus without complication (HCC)    History of blood transfusion     Hyperlipidemia     Past Surgical History:  Procedure Laterality Date   CATARACT EXTRACTION Bilateral 04/2013-05/2013    Family History  Problem Relation Age of Onset   Cancer Neg Hx    Diabetes Neg Hx    Stroke Neg Hx    Breast cancer Neg Hx     Social History   Socioeconomic History   Marital status: Married    Spouse name: Not on file   Number of children: 2   Years of education: Not on file   Highest education level: Not on file  Occupational History   Occupation: Unemployed    Employer: NOT EMPLOYED  Tobacco Use   Smoking status: Former    Current packs/day: 0.00    Types: Cigarettes    Quit date: 2012    Years since quitting: 13.1   Smokeless tobacco: Never  Substance and Sexual Activity   Alcohol use: No   Drug use: No   Sexual activity: Yes  Other Topics Concern   Not on file  Social History Narrative   Regular exercise-no   Caffeine Use-yes   Social Drivers of Health   Financial Resource Strain: Low Risk  (06/18/2023)   Overall Financial Resource Strain (CARDIA)    Difficulty of Paying Living Expenses: Not hard at all  Food Insecurity: No Food Insecurity (06/18/2023)  Hunger Vital Sign    Worried About Running Out of Food in the Last Year: Never true    Ran Out of Food in the Last Year: Never true  Transportation Needs: No Transportation Needs (06/20/2020)   Received from [facility], [facility]   [facility] - Transportation    In the past 12 months, has lack of transportation kept you from medical appointments or from getting medications?: No    Lack of Transportation (Non-Medical): No  Physical Activity: Inactive (06/18/2023)   Exercise Vital Sign    Days of Exercise per Week: 0 days    Minutes of Exercise per Session: 0 min  Stress: No Stress Concern Present (06/18/2023)   [employer] of Occupational Health - Occupational Stress Questionnaire    Feeling of Stress : Not at all  Social Connections: Moderately  Isolated (06/18/2023)   Social Connection and Isolation Panel [NHANES]    Frequency of Communication with Friends and Family: More than three times a week    Frequency of Social Gatherings with Friends and Family: Twice a week    Attends Religious Services: Never    Database administrator or Organizations: No    Attends Banker Meetings: Never    Marital Status: Married  Catering manager Violence: Not At Risk (06/18/2023)   Humiliation, Afraid, Rape, and Kick questionnaire    Fear of Current or Ex-Partner: No    Emotionally Abused: No    Physically Abused: No    Sexually Abused: No     Current Outpatient Medications:    citalopram (CELEXA) 10 MG tablet, TAKE 1 TABLET(10 MG) BY MOUTH DAILY, Disp: 90 tablet, Rfl: 0   diclofenac Sodium (VOLTAREN) 1 % GEL, Apply topically 4 (four) times daily., Disp: , Rfl:    donepezil (ARICEPT) 10 MG tablet, TAKE 1 TABLET(10 MG) BY MOUTH AT BEDTIME, Disp: 90 tablet, Rfl: 0   omeprazole (PRILOSEC) 20 MG capsule, Take 20 mg by mouth daily., Disp: , Rfl:    traZODone (DESYREL) 100 MG tablet, TAKE 1 TABLET(100 MG) BY MOUTH AT BEDTIME, Disp: 90 tablet, Rfl: 0   TRULICITY 3 MG/0.5ML SOAJ, ADMINISTER 3 MG UNDER THE SKIN 1 TIME A WEEK AS DIRECTED, Disp: 2 mL, Rfl: 2   Allergies  Allergen Reactions   Oxycodone Nausea Only   Aspirin Nausea Only    Can take coated aspirin   CONSTITUTIONAL: Negative for chills, fatigue, fever, unintentional weight gain and unintentional weight loss.  E/N/T: Negative for ear pain, nasal congestion and sore throat.  CARDIOVASCULAR: Negative for chest pain, dizziness, palpitations and pedal edema.  RESPIRATORY: Negative for recent cough and dyspnea.  GASTROINTESTINAL: Negative for abdominal pain, acid reflux symptoms, constipation, diarrhea, nausea and vomiting.  MSK: see HPI INTEGUMENTARY: Negative for rash.  NEUROLOGICAL: Negative for dizziness and headaches.  PSYCHIATRIC: Negative for sleep disturbance and to  question depression screen.  Negative for depression, negative for anhedonia.        Objective:  PHYSICAL EXAM:   VS: BP 122/80 (BP Location: Left Arm, Patient Position: Sitting, Cuff Size: Normal)   Pulse 95   Temp 97.7 F (36.5 C) (Temporal)   Resp 18   Ht 5\' 4"  (1.626 m)   Wt 101 lb (45.8 kg)   SpO2 98%   BMI 17.34 kg/m   GEN: Well nourished, well developed, in no acute distress   Cardiac: RRR; no murmurs, rubs, or gallops,no edema -  Respiratory:  normal respiratory rate and pattern with  no distress - normal breath sounds with no rales, rhonchi, wheezes or rubs GI: normal bowel sounds, no masses or tenderness MS: no deformity or atrophy  Skin: warm and dry, no rash  Neuro:  Alert and Oriented x 3, - CN II-Xii grossly intact Psych: euthymic mood, appropriate affect and demeanor   BP 122/80 (BP Location: Left Arm, Patient Position: Sitting, Cuff Size: Normal)   Pulse 95   Temp 97.7 F (36.5 C) (Temporal)   Resp 18   Ht 5\' 4"  (1.626 m)   Wt 101 lb (45.8 kg)   SpO2 98%   BMI 17.34 kg/m  Wt Readings from Last 3 Encounters:  06/18/23 101 lb (45.8 kg)  12/12/22 100 lb (45.4 kg)  09/05/22 105 lb 3.2 oz (47.7 kg)     Health Maintenance Due  Topic Date Due   Medicare Annual Wellness (AWV)  Never done   HEMOGLOBIN A1C  06/14/2023    There are no preventive care reminders to display for this patient.  Lab Results  Component Value Date   TSH 0.046 (L) 06/04/2022   Lab Results  Component Value Date   WBC 6.1 12/12/2022   HGB 11.2 12/12/2022   HCT 33.4 (L) 12/12/2022   MCV 89 12/12/2022   PLT 228 12/12/2022   Lab Results  Component Value Date   NA 142 12/12/2022   K 4.3 12/12/2022   CO2 24 12/12/2022   GLUCOSE 129 (H) 12/12/2022   BUN 21 12/12/2022   CREATININE 1.37 (H) 12/12/2022   BILITOT 0.4 12/12/2022   ALKPHOS 62 12/12/2022   AST 15 12/12/2022   ALT 13 12/12/2022   PROT 6.7 12/12/2022   ALBUMIN 4.6 12/12/2022   CALCIUM 9.9 12/12/2022   EGFR  42 (L) 12/12/2022   GFR 71.18 01/26/2015   Lab Results  Component Value Date   CHOL 248 (H) 12/12/2022   Lab Results  Component Value Date   HDL 79 12/12/2022   Lab Results  Component Value Date   LDLCALC 148 (H) 12/12/2022   Lab Results  Component Value Date   TRIG 120 12/12/2022   Lab Results  Component Value Date   CHOLHDL 3.1 12/12/2022   Lab Results  Component Value Date   HGBA1C 7.1 (H) 12/12/2022      Assessment & Plan:   Problem List Items Addressed This Visit       Other   Depression, major, single episode, mild (HCC)   Relevant Medications   citalopram (CELEXA) 10 MG tablet   traZODone (DESYREL) 100 MG tablet   Other Relevant Orders   TSH    Other Visit Diagnoses     NIDDM - controlled  -  Primary   Relevant Medications   Dulaglutide (TRULICITY) 1.5 MG/0.5ML SOPN   Other Relevant Orders   CBC with Differential/Platelet    Comprehensive metabolic panel    Hemoglobin A1c    TSH    Late onset Alzheimer's dementia with behavioral disturbance (HCC)       Relevant Medications   citalopram (CELEXA) 10 MG tablet   donepezil (ARICEPT) 10 MG tablet   traZODone (DESYREL) 100 MG tablet   Other Relevant Orders   CBC with Differential/Platelet    Comprehensive metabolic panel    TSH    Abnormal mammogram   Relevant Orders   Bilateral diagnostic mammogram      Menopausal and postmenopausal disorder   Dexa scan ordered   Late onset Alzheimer's dementia without behavioral disturbance (HCC)  Relevant Medications   citalopram (CELEXA) 10 MG tablet   donepezil (ARICEPT) 10 MG tablet   traZODone (DESYREL) 100 MG tablet      Need for flu vaccine Trivalent fluad ordered      Vit D def Vit D level pending             No orders of the defined types were placed in this encounter.   Follow-up: Return in about 4 months (around 10/18/2023) for chronic fasting follow-up - MCR wellness with Selena Batten in next few months.    SARA R Briyan Kleven, PA-C

## 2023-06-19 ENCOUNTER — Ambulatory Visit: Payer: Self-pay | Admitting: Physician Assistant

## 2023-06-19 ENCOUNTER — Other Ambulatory Visit: Payer: Self-pay | Admitting: Physician Assistant

## 2023-06-19 DIAGNOSIS — E119 Type 2 diabetes mellitus without complications: Secondary | ICD-10-CM

## 2023-06-19 LAB — CBC WITH DIFFERENTIAL/PLATELET
Basophils Absolute: 0.1 10*3/uL (ref 0.0–0.2)
Basos: 1 %
EOS (ABSOLUTE): 0.1 10*3/uL (ref 0.0–0.4)
Eos: 1 %
Hematocrit: 32.8 % — ABNORMAL LOW (ref 34.0–46.6)
Hemoglobin: 10.6 g/dL — ABNORMAL LOW (ref 11.1–15.9)
Immature Grans (Abs): 0 10*3/uL (ref 0.0–0.1)
Immature Granulocytes: 0 %
Lymphocytes Absolute: 1.3 10*3/uL (ref 0.7–3.1)
Lymphs: 20 %
MCH: 29.3 pg (ref 26.6–33.0)
MCHC: 32.3 g/dL (ref 31.5–35.7)
MCV: 91 fL (ref 79–97)
Monocytes Absolute: 0.5 10*3/uL (ref 0.1–0.9)
Monocytes: 7 %
Neutrophils Absolute: 4.7 10*3/uL (ref 1.4–7.0)
Neutrophils: 71 %
Platelets: 208 10*3/uL (ref 150–450)
RBC: 3.62 x10E6/uL — ABNORMAL LOW (ref 3.77–5.28)
RDW: 12.2 % (ref 11.7–15.4)
WBC: 6.7 10*3/uL (ref 3.4–10.8)

## 2023-06-19 LAB — HEMOGLOBIN A1C
Est. average glucose Bld gHb Est-mCnc: 169 mg/dL
Hgb A1c MFr Bld: 7.5 % — ABNORMAL HIGH (ref 4.8–5.6)

## 2023-06-19 LAB — COMPREHENSIVE METABOLIC PANEL
ALT: 11 IU/L (ref 0–32)
AST: 15 IU/L (ref 0–40)
Albumin: 4.4 g/dL (ref 3.9–4.9)
Alkaline Phosphatase: 65 IU/L (ref 44–121)
BUN/Creatinine Ratio: 17 (ref 12–28)
BUN: 26 mg/dL (ref 8–27)
Bilirubin Total: 0.3 mg/dL (ref 0.0–1.2)
CO2: 26 mmol/L (ref 20–29)
Calcium: 9.6 mg/dL (ref 8.7–10.3)
Chloride: 104 mmol/L (ref 96–106)
Creatinine, Ser: 1.53 mg/dL — ABNORMAL HIGH (ref 0.57–1.00)
Globulin, Total: 2 g/dL (ref 1.5–4.5)
Glucose: 124 mg/dL — ABNORMAL HIGH (ref 70–99)
Potassium: 4.6 mmol/L (ref 3.5–5.2)
Sodium: 142 mmol/L (ref 134–144)
Total Protein: 6.4 g/dL (ref 6.0–8.5)
eGFR: 37 mL/min/{1.73_m2} — ABNORMAL LOW (ref >59)

## 2023-06-19 LAB — LIPID PANEL
Chol/HDL Ratio: 2.8 ratio (ref 0.0–4.4)
Cholesterol, Total: 241 mg/dL — ABNORMAL HIGH (ref 100–199)
HDL: 85 mg/dL (ref >39)
LDL Chol Calc (NIH): 143 mg/dL — ABNORMAL HIGH (ref 0–99)
Triglycerides: 78 mg/dL (ref 0–149)
VLDL Cholesterol Cal: 13 mg/dL (ref 5–40)

## 2023-06-19 LAB — TSH: TSH: 0.46 u[IU]/mL (ref 0.450–4.500)

## 2023-06-19 LAB — VITAMIN D 25 HYDROXY (VIT D DEFICIENCY, FRACTURES): Vit D, 25-Hydroxy: 20 ng/mL — ABNORMAL LOW (ref 30.0–100.0)

## 2023-06-19 MED ORDER — DAPAGLIFLOZIN PROPANEDIOL 5 MG PO TABS: 5.0000 mg | ORAL_TABLET | Freq: Every day | ORAL | 2 refills | Status: DC

## 2023-06-19 NOTE — Telephone Encounter (Signed)
 Copied from CRM [id number]. Topic: Clinical - Lab/Test Results >> Jun 19, 2023 10:05 AM Shon Hale wrote: Reason for CRM:  Calling for lab results. DM - daughter states she hasn't missed a dose and was on metformin but taken off due to kidney issues.  Has not seen a nephrologist in the past.

## 2023-06-19 NOTE — Telephone Encounter (Signed)
 Recommend to continue trulicity and will send in farxiga 5mg  qd to start taking Also recommend referral to nephrology - would rather go [facility]

## 2023-06-20 NOTE — Telephone Encounter (Signed)
 Spoke with patient's daughter, Antony Contras, She requested [facility] for Nephrology.

## 2023-06-24 ENCOUNTER — Telehealth: Payer: Self-pay

## 2023-06-24 ENCOUNTER — Other Ambulatory Visit: Payer: Self-pay

## 2023-06-24 NOTE — Telephone Encounter (Signed)
 PA not required for Farxiga 5 mg per covermymeds. Key: [id number]

## 2023-06-25 ENCOUNTER — Other Ambulatory Visit: Payer: Self-pay | Admitting: Physician Assistant

## 2023-06-25 DIAGNOSIS — N1832 Chronic kidney disease, stage 3b: Secondary | ICD-10-CM

## 2023-06-25 NOTE — Telephone Encounter (Signed)
 Referral made

## 2023-06-26 ENCOUNTER — Other Ambulatory Visit: Payer: Self-pay | Admitting: Physician Assistant

## 2023-06-26 DIAGNOSIS — E119 Type 2 diabetes mellitus without complications: Secondary | ICD-10-CM

## 2023-06-26 MED ORDER — EMPAGLIFLOZIN 10 MG PO TABS: 10.0000 mg | ORAL_TABLET | Freq: Every day | ORAL | 2 refills | Status: DC

## 2023-07-04 LAB — HM DEXA SCAN

## 2023-07-10 ENCOUNTER — Encounter: Payer: Self-pay | Admitting: Physician Assistant

## 2023-07-11 ENCOUNTER — Other Ambulatory Visit: Payer: Self-pay | Admitting: Physician Assistant

## 2023-07-11 DIAGNOSIS — M810 Age-related osteoporosis without current pathological fracture: Secondary | ICD-10-CM

## 2023-07-11 MED ORDER — ALENDRONATE SODIUM 70 MG PO TABS: 70.0000 mg | ORAL_TABLET | ORAL | 5 refills | Status: DC

## 2023-08-12 ENCOUNTER — Encounter: Payer: Self-pay | Admitting: Physician Assistant

## 2023-08-12 LAB — HM MAMMOGRAPHY

## 2023-08-19 ENCOUNTER — Ambulatory Visit: Admitting: Physician Assistant

## 2023-09-09 ENCOUNTER — Other Ambulatory Visit: Payer: Self-pay | Admitting: Physician Assistant

## 2023-09-09 DIAGNOSIS — F320 Major depressive disorder, single episode, mild: Secondary | ICD-10-CM

## 2023-09-09 DIAGNOSIS — F0280 Dementia in other diseases classified elsewhere without behavioral disturbance: Secondary | ICD-10-CM

## 2023-09-12 ENCOUNTER — Other Ambulatory Visit: Payer: Self-pay

## 2023-09-12 DIAGNOSIS — E119 Type 2 diabetes mellitus without complications: Secondary | ICD-10-CM

## 2023-09-12 MED ORDER — EMPAGLIFLOZIN 10 MG PO TABS: 10.0000 mg | ORAL_TABLET | Freq: Every day | ORAL | 0 refills | Status: DC

## 2023-09-17 ENCOUNTER — Other Ambulatory Visit: Payer: Self-pay | Admitting: Physician Assistant

## 2023-09-17 DIAGNOSIS — E119 Type 2 diabetes mellitus without complications: Secondary | ICD-10-CM

## 2023-09-19 ENCOUNTER — Ambulatory Visit: Admitting: Physician Assistant

## 2023-09-30 ENCOUNTER — Other Ambulatory Visit: Payer: Self-pay

## 2023-09-30 DIAGNOSIS — E119 Type 2 diabetes mellitus without complications: Secondary | ICD-10-CM

## 2023-09-30 MED ORDER — EMPAGLIFLOZIN 10 MG PO TABS: 10.0000 mg | ORAL_TABLET | Freq: Every day | ORAL | 0 refills | Status: DC

## 2023-10-11 ENCOUNTER — Other Ambulatory Visit: Payer: Self-pay | Admitting: Physician Assistant

## 2023-10-11 DIAGNOSIS — E119 Type 2 diabetes mellitus without complications: Secondary | ICD-10-CM

## 2023-10-11 NOTE — Telephone Encounter (Unsigned)
 Copied from CRM [id number]. Topic: Clinical - Medication Refill >> Oct 11, 2023 10:51 AM Montie POUR wrote: Medication: empagliflozin  (JARDIANCE ) 10 MG TABS tablet  Has the patient contacted their pharmacy? Yes (Agent: If no, request that the patient contact the pharmacy for the refill. If patient does not wish to contact the pharmacy document the reason why and proceed with request.) (Agent: If yes, when and what did the pharmacy advise?) Pharmacy needs order to refill  This is the patient's preferred pharmacy:  [facility]  [address] [place], [place] [address] [phone]  Is this the correct pharmacy for this prescription? Yes If no, delete pharmacy and type the correct one.   Has the prescription been filled recently? No  Is the patient out of the medication? No - She has enough medication for 7 days  Has the patient been seen for an appointment in the last year OR does the patient have an upcoming appointment? Yes  Can we respond through MyChart? No  Agent: Please be advised that Rx refills may take up to 3 business days. We ask that you follow-up with your pharmacy.

## 2023-10-21 ENCOUNTER — Other Ambulatory Visit: Payer: Self-pay | Admitting: Physician Assistant

## 2023-10-21 DIAGNOSIS — E119 Type 2 diabetes mellitus without complications: Secondary | ICD-10-CM

## 2023-10-22 ENCOUNTER — Ambulatory Visit: Admitting: Physician Assistant

## 2023-10-23 ENCOUNTER — Ambulatory Visit (INDEPENDENT_AMBULATORY_CARE_PROVIDER_SITE_OTHER): Admitting: Physician Assistant

## 2023-10-23 ENCOUNTER — Encounter: Payer: Self-pay | Admitting: Physician Assistant

## 2023-10-23 VITALS — BP 100/68 | HR 85 | Temp 97.5°F | Ht 64.0 in | Wt 99.8 lb

## 2023-10-23 DIAGNOSIS — E782 Mixed hyperlipidemia: Secondary | ICD-10-CM | POA: Diagnosis not present

## 2023-10-23 DIAGNOSIS — N1832 Chronic kidney disease, stage 3b: Secondary | ICD-10-CM | POA: Diagnosis not present

## 2023-10-23 DIAGNOSIS — E119 Type 2 diabetes mellitus without complications: Secondary | ICD-10-CM

## 2023-10-23 DIAGNOSIS — F02A0 Dementia in other diseases classified elsewhere, mild, without behavioral disturbance, psychotic disturbance, mood disturbance, and anxiety: Secondary | ICD-10-CM

## 2023-10-23 DIAGNOSIS — M810 Age-related osteoporosis without current pathological fracture: Secondary | ICD-10-CM | POA: Diagnosis not present

## 2023-10-23 DIAGNOSIS — F411 Generalized anxiety disorder: Secondary | ICD-10-CM

## 2023-10-23 DIAGNOSIS — E1165 Type 2 diabetes mellitus with hyperglycemia: Secondary | ICD-10-CM

## 2023-10-23 DIAGNOSIS — G301 Alzheimer's disease with late onset: Secondary | ICD-10-CM

## 2023-10-23 NOTE — Progress Notes (Unsigned)
 Established Patient Office Visit  Subjective:  Patient ID: Donna Moran, female    DOB: October 09, 1955  Age: 68 y.o. MRN: [id number]  CC:  Chief Complaint  Patient presents with   Medical Management of Chronic Issues    HPI Donna Moran presents for chronic health management --- pt has not been seen by me in about a year.  She had a fracture of her left shoulder in 2022 and was discharged to home for 4 weeks and had a demise in her health then admitted to [facility].  She was a resident there for over a year and then discharged last year.   She is currently doing well at home with her husband (per her daughter who accompanies her today)  Pt with history of NIDDM - has had for several years - unsure exactly.  States she is checking her glucose and it has been within fasting of 120s-130s.  She is taking trulicity  3mg  weekly.  She is overdue for eye appt and will schedule Is due for labwork  Pt with history of mild depression and anxiety as well as early onset of dementia.  She is currently on citalopram  10mg  , desyrel  100mg  qhs and aricept  10mg  Daughter states she has been on those medications throughout her stay at Rehab and for the past year has been stable on these medications  Pt had an abnormal mammogram last spring and was supposed to have gotten a diagnostic mammogram but never followed through with it - will schedule this along with a dexa scan which she has not had in many years  Pt with GERD - stable on omeprazole  20mg  qd   Pt with known chronic kidney disease - currently does not follow with urology --- upon questioning pt about her medications she has been taking multiple advil daily for the past several months for chronic shoulder and back pain - recommend to stop the oral medication and she does have voltaren gel at home and would advise to use that instead  Pt would like flu shot today  Past Medical History:  Diagnosis Date   Diabetes mellitus without  complication (HCC)    History of blood transfusion    Hyperlipidemia     Past Surgical History:  Procedure Laterality Date   CATARACT EXTRACTION Bilateral 04/2013-05/2013    Family History  Problem Relation Age of Onset   Cancer Neg Hx    Diabetes Neg Hx    Stroke Neg Hx    Breast cancer Neg Hx     Social History   Socioeconomic History   Marital status: Married    Spouse name: Not on file   Number of children: 2   Years of education: Not on file   Highest education level: Not on file  Occupational History   Occupation: Unemployed    Employer: NOT EMPLOYED  Tobacco Use   Smoking status: Former    Current packs/day: 0.00    Types: Cigarettes    Quit date: 2012    Years since quitting: 13.5   Smokeless tobacco: Never  Substance and Sexual Activity   Alcohol use: No   Drug use: No   Sexual activity: Yes  Other Topics Concern   Not on file  Social History Narrative   Regular exercise-no   Caffeine Use-yes   Social Drivers of Health   Financial Resource Strain: Low Risk  (06/18/2023)   Overall Financial Resource Strain (CARDIA)    Difficulty of Paying Living Expenses: Not hard  at all  Food Insecurity: No Food Insecurity (06/18/2023)   Hunger Vital Sign    Worried About Running Out of Food in the Last Year: Never true    Ran Out of Food in the Last Year: Never true  Transportation Needs: No Transportation Needs (06/20/2020)   Received from [facility]    In the past 12 months, has lack of transportation kept you from medical appointments or from getting medications?: No    Lack of Transportation (Non-Medical): No  Physical Activity: Inactive (06/18/2023)   Exercise Vital Sign    Days of Exercise per Week: 0 days    Minutes of Exercise per Session: 0 min  Stress: No Stress Concern Present (06/18/2023)   [employer] of Occupational Health - Occupational Stress Questionnaire    Feeling of Stress : Not at all  Social  Connections: Moderately Isolated (06/18/2023)   Social Connection and Isolation Panel    Frequency of Communication with Friends and Family: More than three times a week    Frequency of Social Gatherings with Friends and Family: Twice a week    Attends Religious Services: Never    Database administrator or Organizations: No    Attends Banker Meetings: Never    Marital Status: Married  Catering manager Violence: Not At Risk (06/18/2023)   Humiliation, Afraid, Rape, and Kick questionnaire    Fear of Current or Ex-Partner: No    Emotionally Abused: No    Physically Abused: No    Sexually Abused: No     Current Outpatient Medications:    alendronate  (FOSAMAX ) 70 MG tablet, Take 1 tablet (70 mg total) by mouth once a week. Take with a full glass of water on an empty stomach., Disp: 4 tablet, Rfl: 5   citalopram  (CELEXA ) 10 MG tablet, TAKE 1 TABLET(10 MG) BY MOUTH DAILY, Disp: 90 tablet, Rfl: 0   donepezil  (ARICEPT ) 10 MG tablet, TAKE 1 TABLET(10 MG) BY MOUTH AT BEDTIME, Disp: 90 tablet, Rfl: 0   empagliflozin  (JARDIANCE ) 10 MG TABS tablet, TAKE 1 TABLET(10 MG) BY MOUTH DAILY BEFORE BREAKFAST, Disp: 30 tablet, Rfl: 2   omeprazole  (PRILOSEC) 20 MG capsule, Take 20 mg by mouth daily., Disp: , Rfl:    traZODone  (DESYREL ) 100 MG tablet, TAKE 1 TABLET(100 MG) BY MOUTH AT BEDTIME, Disp: 90 tablet, Rfl: 0   TRULICITY  3 MG/0.5ML SOAJ, ADMINISTER 3 MG UNDER THE SKIN 1 TIME A WEEK AS DIRECTED, Disp: 2 mL, Rfl: 2   diclofenac Sodium (VOLTAREN) 1 % GEL, Apply topically 4 (four) times daily. (Patient not taking: Reported on 10/23/2023), Disp: , Rfl:    Allergies  Allergen Reactions   Oxycodone Nausea Only   Aspirin Nausea Only    Can take coated aspirin   CONSTITUTIONAL: Negative for chills, fatigue, fever, unintentional weight gain and unintentional weight loss.  E/N/T: Negative for ear pain, nasal congestion and sore throat.  CARDIOVASCULAR: Negative for chest pain, dizziness, palpitations  and pedal edema.  RESPIRATORY: Negative for recent cough and dyspnea.  GASTROINTESTINAL: Negative for abdominal pain, acid reflux symptoms, constipation, diarrhea, nausea and vomiting.  MSK: see HPI INTEGUMENTARY: Negative for rash.  NEUROLOGICAL: Negative for dizziness and headaches.  PSYCHIATRIC: Negative for sleep disturbance and to question depression screen.  Negative for depression, negative for anhedonia.        Objective:  PHYSICAL EXAM:   VS: BP 100/68   Pulse 85   Temp (!) 97.5 F (36.4 C)  Ht 5' 4 (1.626 m)   Wt 99 lb 12.8 oz (45.3 kg)   SpO2 98%   BMI 17.13 kg/m   GEN: Well nourished, well developed, in no acute distress   Cardiac: RRR; no murmurs, rubs, or gallops,no edema -  Respiratory:  normal respiratory rate and pattern with no distress - normal breath sounds with no rales, rhonchi, wheezes or rubs GI: normal bowel sounds, no masses or tenderness MS: no deformity or atrophy  Skin: warm and dry, no rash  Neuro:  Alert and Oriented x 3, - CN II-Xii grossly intact Psych: euthymic mood, appropriate affect and demeanor   BP 100/68   Pulse 85   Temp (!) 97.5 F (36.4 C)   Ht 5' 4 (1.626 m)   Wt 99 lb 12.8 oz (45.3 kg)   SpO2 98%   BMI 17.13 kg/m  Wt Readings from Last 3 Encounters:  10/23/23 99 lb 12.8 oz (45.3 kg)  06/18/23 101 lb (45.8 kg)  12/12/22 100 lb (45.4 kg)     Health Maintenance Due  Topic Date Due   Medicare Annual Wellness (AWV)  Never done   Diabetic kidney evaluation - Urine ACR  09/05/2023   FOOT EXAM  09/05/2023    There are no preventive care reminders to display for this patient.  Lab Results  Component Value Date   TSH 0.460 06/18/2023   Lab Results  Component Value Date   WBC 6.7 06/18/2023   HGB 10.6 (L) 06/18/2023   HCT 32.8 (L) 06/18/2023   MCV 91 06/18/2023   PLT 208 06/18/2023   Lab Results  Component Value Date   NA 142 06/18/2023   K 4.6 06/18/2023   CO2 26 06/18/2023   GLUCOSE 124 (H) 06/18/2023    BUN 26 06/18/2023   CREATININE 1.53 (H) 06/18/2023   BILITOT 0.3 06/18/2023   ALKPHOS 65 06/18/2023   AST 15 06/18/2023   ALT 11 06/18/2023   PROT 6.4 06/18/2023   ALBUMIN 4.4 06/18/2023   CALCIUM  9.6 06/18/2023   EGFR 37 (L) 06/18/2023   GFR 71.18 01/26/2015   Lab Results  Component Value Date   CHOL 241 (H) 06/18/2023   Lab Results  Component Value Date   HDL 85 06/18/2023   Lab Results  Component Value Date   LDLCALC 143 (H) 06/18/2023   Lab Results  Component Value Date   TRIG 78 06/18/2023   Lab Results  Component Value Date   CHOLHDL 2.8 06/18/2023   Lab Results  Component Value Date   HGBA1C 7.5 (H) 06/18/2023      Assessment & Plan:   Problem List Items Addressed This Visit       Other   Depression, major, single episode, mild (HCC)   Relevant Medications   citalopram  (CELEXA ) 10 MG tablet   traZODone  (DESYREL ) 100 MG tablet   Other Relevant Orders   TSH    Other Visit Diagnoses     NIDDM - controlled  -  Primary   Relevant Medications   Dulaglutide  (TRULICITY ) 1.5 MG/0.5ML SOPN   Other Relevant Orders   CBC with Differential/Platelet    Comprehensive metabolic panel    Hemoglobin A1c    TSH    Late onset Alzheimer's dementia with behavioral disturbance (HCC)       Relevant Medications   citalopram  (CELEXA ) 10 MG tablet   donepezil  (ARICEPT ) 10 MG tablet   traZODone  (DESYREL ) 100 MG tablet   Other Relevant Orders   CBC with Differential/Platelet  Comprehensive metabolic panel    TSH    Abnormal mammogram   Relevant Orders   Bilateral diagnostic mammogram      Menopausal and postmenopausal disorder   Dexa scan ordered   Late onset Alzheimer's dementia without behavioral disturbance (HCC)       Relevant Medications   citalopram  (CELEXA ) 10 MG tablet   donepezil  (ARICEPT ) 10 MG tablet   traZODone  (DESYREL ) 100 MG tablet      Need for flu vaccine Trivalent fluad ordered      Vit D defic Vit D level pending              No orders of the defined types were placed in this encounter.   Follow-up: No follow-ups on file.    SARA R Sinda Leedom, PA-C

## 2023-10-24 MED ORDER — EMPAGLIFLOZIN 10 MG PO TABS: 10.0000 mg | ORAL_TABLET | Freq: Every day | ORAL | 1 refills | Status: DC

## 2023-10-29 ENCOUNTER — Other Ambulatory Visit: Payer: Self-pay | Admitting: Physician Assistant

## 2023-10-29 DIAGNOSIS — E119 Type 2 diabetes mellitus without complications: Secondary | ICD-10-CM

## 2023-10-31 ENCOUNTER — Ambulatory Visit

## 2023-11-05 ENCOUNTER — Ambulatory Visit

## 2023-11-18 ENCOUNTER — Ambulatory Visit: Admitting: Physician Assistant

## 2023-11-22 LAB — LAB REPORT - SCANNED
A1c: 6.7
EGFR: 43

## 2023-11-26 ENCOUNTER — Encounter: Payer: Self-pay | Admitting: Physician Assistant

## 2023-11-26 ENCOUNTER — Ambulatory Visit (INDEPENDENT_AMBULATORY_CARE_PROVIDER_SITE_OTHER): Admitting: Physician Assistant

## 2023-11-26 VITALS — BP 90/60 | HR 88 | Temp 98.4°F | Ht 64.0 in | Wt 102.4 lb

## 2023-11-26 DIAGNOSIS — E119 Type 2 diabetes mellitus without complications: Secondary | ICD-10-CM

## 2023-11-26 DIAGNOSIS — Z23 Encounter for immunization: Secondary | ICD-10-CM

## 2023-11-26 DIAGNOSIS — Z1211 Encounter for screening for malignant neoplasm of colon: Secondary | ICD-10-CM

## 2023-11-26 DIAGNOSIS — M810 Age-related osteoporosis without current pathological fracture: Secondary | ICD-10-CM

## 2023-11-26 DIAGNOSIS — Z0000 Encounter for general adult medical examination without abnormal findings: Secondary | ICD-10-CM

## 2023-11-26 DIAGNOSIS — E782 Mixed hyperlipidemia: Secondary | ICD-10-CM

## 2023-11-26 DIAGNOSIS — N1832 Chronic kidney disease, stage 3b: Secondary | ICD-10-CM

## 2023-11-26 NOTE — Progress Notes (Signed)
 Subjective:   Donna Moran is a 68 y.o. female who presents for Medicare Annual (Subsequent) preventive examination.  Visit Complete: In person Cardiac Risk Factors include: advanced age (>74men, >47 women)     Objective:    Today's Vitals   11/26/23 1104  BP: 90/60  Pulse: 88  Temp: 98.4 F (36.9 C)  SpO2: 96%  Weight: 102 lb 6.4 oz (46.4 kg)  Height: 5' 4 (1.626 m)   Body mass index is 17.58 kg/m. CV - RRR Lungs CTA bilaterally    11/26/2023   11:10 AM  Advanced Directives  Does Patient Have a Medical Advance Directive? No    Current Medications (verified) Outpatient Encounter Medications as of 11/26/2023  Medication Sig   alendronate  (FOSAMAX ) 70 MG tablet Take 1 tablet (70 mg total) by mouth once a week. Take with a full glass of water on an empty stomach.   citalopram  (CELEXA ) 10 MG tablet TAKE 1 TABLET(10 MG) BY MOUTH DAILY   diclofenac Sodium (VOLTAREN) 1 % GEL Apply topically 4 (four) times daily.   donepezil  (ARICEPT ) 10 MG tablet TAKE 1 TABLET(10 MG) BY MOUTH AT BEDTIME   lidocaine  4 % Place 1 patch onto the skin as needed.   omeprazole  (PRILOSEC) 20 MG capsule Take 20 mg by mouth daily.   traZODone  (DESYREL ) 100 MG tablet TAKE 1 TABLET(100 MG) BY MOUTH AT BEDTIME   TRULICITY  3 MG/0.5ML SOAJ ADMINISTER 3 MG UNDER THE SKIN 1 TIME A WEEK AS DIRECTED   empagliflozin  (JARDIANCE ) 10 MG TABS tablet TAKE 1 TABLET(10 MG) BY MOUTH DAILY BEFORE BREAKFAST (Patient not taking: Reported on 11/26/2023)   No facility-administered encounter medications on file as of 11/26/2023.    Allergies (verified) Oxycodone and Aspirin   History: Past Medical History:  Diagnosis Date   Diabetes mellitus without complication (HCC)    History of blood transfusion    Hyperlipidemia    Past Surgical History:  Procedure Laterality Date   CATARACT EXTRACTION Bilateral 04/2013-05/2013   Family History  Problem Relation Age of Onset   Cancer Neg Hx    Diabetes Neg Hx     Stroke Neg Hx    Breast cancer Neg Hx    Social History   Socioeconomic History   Marital status: Married    Spouse name: Not on file   Number of children: 2   Years of education: Not on file   Highest education level: Not on file  Occupational History   Occupation: Unemployed    Employer: NOT EMPLOYED  Tobacco Use   Smoking status: Former    Current packs/day: 0.00    Types: Cigarettes    Quit date: 2012    Years since quitting: 13.6   Smokeless tobacco: Never  Substance and Sexual Activity   Alcohol use: No   Drug use: No   Sexual activity: Yes  Other Topics Concern   Not on file  Social History Narrative   Regular exercise-no   Caffeine Use-yes   Social Drivers of Health   Financial Resource Strain: Low Risk  (06/18/2023)   Overall Financial Resource Strain (CARDIA)    Difficulty of Paying Living Expenses: Not hard at all  Food Insecurity: No Food Insecurity (06/18/2023)   Hunger Vital Sign    Worried About Running Out of Food in the Last Year: Never true    Ran Out of Food in the Last Year: Never true  Transportation Needs: No Transportation Needs (06/20/2020)   Received from [facility]  PRAPARE - Transportation    In the past 12 months, has lack of transportation kept you from medical appointments or from getting medications?: No    Lack of Transportation (Non-Medical): No  Physical Activity: Inactive (06/18/2023)   Exercise Vital Sign    Days of Exercise per Week: 0 days    Minutes of Exercise per Session: 0 min  Stress: No Stress Concern Present (06/18/2023)   [employer] of Occupational Health - Occupational Stress Questionnaire    Feeling of Stress : Not at all  Social Connections: Moderately Isolated (06/18/2023)   Social Connection and Isolation Panel    Frequency of Communication with Friends and Family: More than three times a week    Frequency of Social Gatherings with Friends and Family: Twice a week    Attends Religious Services:  Never    Database administrator or Organizations: No    Attends Engineer, structural: Never    Marital Status: Married    Tobacco Counseling Counseling given: Not Answered   Clinical Intake:  Pre-visit preparation completed: No  Pain : No/denies pain     BMI - recorded: 17.58 Nutritional Status: BMI <19  Underweight Nutritional Risks: None Diabetes: Yes CBG done?: No Did pt. bring in CBG monitor from home?: No  How often do you need to have someone help you when you read instructions, pamphlets, or other written materials from your doctor or pharmacy?: 1 - Never What is the last grade level you completed in school?: 12th grade  Interpreter Needed?: No      Activities of Daily Living    11/26/2023   11:11 AM  In your present state of health, do you have any difficulty performing the following activities:  Hearing? 0  Vision? 0  Difficulty concentrating or making decisions? 0  Walking or climbing stairs? 1  Comment stairs  Dressing or bathing? 0  Doing errands, shopping? 1  Comment daugther goes everywhere as patient cannot Engineer, manufacturing and eating ? N  Using the Toilet? N  In the past six months, have you accidently leaked urine? Y  Do you have problems with loss of bowel control? N  Managing your Medications? N  Managing your Finances? N  Housekeeping or managing your Housekeeping? N    Patient Care Team: Nicholaus Credit, PA-C as PCP - General (Physician Assistant)  Indicate any recent Medical Services you may have received from other than [facility] providers in the past year (date may be approximate).     Assessment:   This is a routine wellness examination for Donna Moran.  Hearing/Vision screen No results found.   Goals Addressed   None    Depression Screen    11/26/2023   11:11 AM 10/23/2023    2:12 PM 06/18/2023    1:27 PM 12/12/2022    1:42 PM 09/05/2022    1:57 PM 06/04/2022    2:35 PM 12/27/2020   11:08 AM  PHQ 2/9 Scores  PHQ - 2  Score 0 0 0 1 0 0 0  PHQ- 9 Score  4 0 2 0 0 7    Fall Risk    11/26/2023   11:11 AM 10/23/2023    2:12 PM 06/18/2023    1:26 PM 12/12/2022    1:42 PM 09/05/2022    1:57 PM  Fall Risk   Falls in the past year? 1 0 0 0 0  Number falls in past yr: 0 0 0 0 0  Injury with Fall?  0 0 0 0 0  Risk for fall due to : History of fall(s) No Fall Risks No Fall Risks No Fall Risks No Fall Risks  Follow up Falls evaluation completed  Falls evaluation completed Falls evaluation completed Falls evaluation completed    Cognitive Function:    12/15/2020    2:15 PM 09/14/2020    3:30 PM  MMSE - Mini Mental State Exam  Orientation to time 3 1  Orientation to time comments 2022, Summer, September, ?, ? 2022, Tuesday, Month?, Spring, 3rd  Orientation to Place 2 3  Orientation to Place-comments [address] [address], [place]?, [place]?, [place]?, [address], [place], [place]?, [place]?, [place]  Registration 3 3  Registration-comments [place], [place], [place], [place], [place]  Attention/ Calculation 0 1  Attention/Calculation-comments [place]? DRO  Recall 2 1  Recall-comments [place], [place], ? [place],  Language- name 2 objects 2 2  Language- name 2 objects-comments Watch, [place], [place]  Language- repeat 1 1  Language- repeat-comments No if's, and's or but's No if's, and's or but's  Language- follow 3 step command 2 3  Language- follow 3 step command-comments Directions: Pick paper up with right hand, fold in half and place back down onto table.   Patient handed paper to me. Pick paper up in right hand. Fold in half. Place back onto table.  Language- read & follow direction 0 1  Language-read & follow direction-comments Close your eyes Close your eyes.  Write a sentence 1 1  Write a sentence-comments I love my family. I love you.  Copy design 0 0  Copy design-comments  Became frustrated, stated it is too hard and quit.  Total score 16 17        11/26/2023   11:13 AM  6CIT Screen  What Year? 0 points  What month? 0 points   What time? 0 points  Count back from 20 0 points  Months in reverse 4 points  Repeat phrase 0 points  Total Score 4 points    Immunizations Immunization History  Administered Date(s) Administered   Fluad Trivalent(High Dose 65+) 06/18/2023   Influenza,inj,Quad PF,6+ Mos 01/01/2014, 01/26/2015   Influenza-Unspecified 02/15/2020   PFIZER(Purple Top)SARS-COV-2 Vaccination 06/23/2019, 09/05/2019   PNEUMOCOCCAL CONJUGATE-20 11/26/2023   Pneumococcal Conjugate-13 01/26/2015   Pneumococcal Polysaccharide-23 01/01/2014   Tdap 10/07/2012    TDAP status: Due, Education has been provided regarding the importance of this vaccine. Advised may receive this vaccine at local pharmacy or Health Dept. Aware to provide a copy of the vaccination record if obtained from local pharmacy or Health Dept. Verbalized acceptance and understanding.  Flu Vaccine status: Up to date  Pneumococcal vaccine status: Up to date  Covid-19 vaccine status: Completed vaccines  Qualifies for Shingles Vaccine? No   Zostavax completed No   Shingrix Completed?: No.    Education has been provided regarding the importance of this vaccine. Patient has been advised to call insurance company to determine out of pocket expense if they have not yet received this vaccine. Advised may also receive vaccine at local pharmacy or Health Dept. Verbalized acceptance and understanding.  Screening Tests Health Maintenance  Topic Date Due   Diabetic kidney evaluation - Urine ACR  09/05/2023   FOOT EXAM  09/05/2023   OPHTHALMOLOGY EXAM  11/26/2023 (Originally 09/28/2020)   Zoster Vaccines- Shingrix (1 of 2) 01/23/2024 (Originally 07/31/1974)   DTaP/Tdap/Td (2 - Td or Tdap) 06/17/2024 (Originally 10/08/2022)   INFLUENZA VACCINE  07/14/2024 (Originally 11/15/2023)   Colonoscopy  10/22/2024 (Originally 04/26/2021)  HEMOGLOBIN A1C  12/19/2023   Diabetic kidney evaluation - eGFR measurement  06/17/2024   DEXA SCAN  07/03/2024   MAMMOGRAM   08/11/2024   Medicare Annual Wellness (AWV)  11/25/2024   Pneumococcal Vaccine: 50+ Years  Completed   Hepatitis B Vaccines  Aged Out   HPV VACCINES  Aged Out   Meningococcal B Vaccine  Aged Out   COVID-19 Vaccine  Discontinued   Hepatitis C Screening  Discontinued    Health Maintenance  Health Maintenance Due  Topic Date Due   Diabetic kidney evaluation - Urine ACR  09/05/2023   FOOT EXAM  09/05/2023    Cologuard ordered  Mammogram status: Completed 08/12/2023. Repeat every year  Bone Density status: Completed 07/04/2023. Results reflect: Bone density results: OSTEOPOROSIS. Repeat every 1 years.   Additional Screening:   Vision Screening: Recommended annual ophthalmology exams for early detection of glaucoma and other disorders of the eye. Is the patient up to date with their annual eye exam?  yes  Dental Screening: Recommended annual dental exams for proper oral hygiene      Plan:     I have personally reviewed and noted the following in the patient's chart:   Medical and social history Use of alcohol, tobacco or illicit drugs  Current medications and supplements including opioid prescriptions. Patient is not currently taking opioid prescriptions. Functional ability and status Nutritional status Physical activity Advanced directives List of other physicians Hospitalizations, surgeries, and ER visits in previous 12 months Vitals Screenings to include cognitive, depression, and falls Referrals and appointments  In addition, I have reviewed and discussed with patient certain preventive protocols, quality metrics, and best practice recommendations. A written personalized care plan for preventive services as well as general preventive health recommendations were provided to patient.   Pneumo 20 given  SARA R Lanee Chain, PA-C   11/26/2023   After Visit Summary: (In Person-Printed) AVS printed and given to the patient

## 2023-11-27 ENCOUNTER — Ambulatory Visit: Payer: Self-pay | Admitting: Physician Assistant

## 2023-11-27 ENCOUNTER — Other Ambulatory Visit: Payer: Self-pay

## 2023-11-27 ENCOUNTER — Other Ambulatory Visit: Payer: Self-pay | Admitting: Physician Assistant

## 2023-11-27 DIAGNOSIS — R899 Unspecified abnormal finding in specimens from other organs, systems and tissues: Secondary | ICD-10-CM

## 2023-11-27 LAB — CBC WITH DIFFERENTIAL/PLATELET
Basophils Absolute: 0 x10E3/uL (ref 0.0–0.2)
Basos: 1 %
EOS (ABSOLUTE): 0.1 x10E3/uL (ref 0.0–0.4)
Eos: 1 %
Hematocrit: 34.7 % (ref 34.0–46.6)
Hemoglobin: 10.9 g/dL — ABNORMAL LOW (ref 11.1–15.9)
Immature Grans (Abs): 0.1 x10E3/uL (ref 0.0–0.1)
Immature Granulocytes: 1 %
Lymphocytes Absolute: 1.4 x10E3/uL (ref 0.7–3.1)
Lymphs: 24 %
MCH: 29.8 pg (ref 26.6–33.0)
MCHC: 31.4 g/dL — ABNORMAL LOW (ref 31.5–35.7)
MCV: 95 fL (ref 79–97)
Monocytes Absolute: 0.5 x10E3/uL (ref 0.1–0.9)
Monocytes: 8 %
Neutrophils Absolute: 3.8 x10E3/uL (ref 1.4–7.0)
Neutrophils: 65 %
Platelets: 213 x10E3/uL (ref 150–450)
RBC: 3.66 x10E6/uL — ABNORMAL LOW (ref 3.77–5.28)
RDW: 12.4 % (ref 11.7–15.4)
WBC: 5.8 x10E3/uL (ref 3.4–10.8)

## 2023-11-27 LAB — COMPREHENSIVE METABOLIC PANEL WITH GFR
ALT: 14 IU/L (ref 0–32)
AST: 20 IU/L (ref 0–40)
Albumin: 4.4 g/dL (ref 3.9–4.9)
Alkaline Phosphatase: 44 IU/L (ref 44–121)
BUN/Creatinine Ratio: 27 (ref 12–28)
BUN: 33 mg/dL — ABNORMAL HIGH (ref 8–27)
Bilirubin Total: 0.4 mg/dL (ref 0.0–1.2)
CO2: 23 mmol/L (ref 20–29)
Calcium: 9.5 mg/dL (ref 8.7–10.3)
Chloride: 104 mmol/L (ref 96–106)
Creatinine, Ser: 1.24 mg/dL — ABNORMAL HIGH (ref 0.57–1.00)
Globulin, Total: 2.1 g/dL (ref 1.5–4.5)
Glucose: 120 mg/dL — ABNORMAL HIGH (ref 70–99)
Potassium: 4.7 mmol/L (ref 3.5–5.2)
Sodium: 140 mmol/L (ref 134–144)
Total Protein: 6.5 g/dL (ref 6.0–8.5)
eGFR: 47 mL/min/1.73 — ABNORMAL LOW (ref >59)

## 2023-11-27 LAB — HEMOGLOBIN A1C
Est. average glucose Bld gHb Est-mCnc: 137 mg/dL
Hgb A1c MFr Bld: 6.4 % — ABNORMAL HIGH (ref 4.8–5.6)

## 2023-11-27 LAB — TSH: TSH: 0.261 u[IU]/mL — ABNORMAL LOW (ref 0.450–4.500)

## 2023-11-27 LAB — LIPID PANEL
Chol/HDL Ratio: 2.8 ratio (ref 0.0–4.4)
Cholesterol, Total: 228 mg/dL — ABNORMAL HIGH (ref 100–199)
HDL: 81 mg/dL (ref >39)
LDL Chol Calc (NIH): 138 mg/dL — ABNORMAL HIGH (ref 0–99)
Triglycerides: 55 mg/dL (ref 0–149)
VLDL Cholesterol Cal: 9 mg/dL (ref 5–40)

## 2023-11-27 LAB — VITAMIN D 25 HYDROXY (VIT D DEFICIENCY, FRACTURES): Vit D, 25-Hydroxy: 49.6 ng/mL (ref 30.0–100.0)

## 2023-11-29 LAB — IRON AND TIBC
Iron Saturation: 31 % (ref 15–55)
Iron: 86 ug/dL (ref 27–139)
Total Iron Binding Capacity: 276 ug/dL (ref 250–450)
UIBC: 190 ug/dL (ref 118–369)

## 2023-11-29 LAB — FERRITIN: Ferritin: 128 ng/mL (ref 15–150)

## 2023-11-29 LAB — SPECIMEN STATUS REPORT

## 2023-12-10 ENCOUNTER — Other Ambulatory Visit: Payer: Self-pay | Admitting: Physician Assistant

## 2023-12-10 DIAGNOSIS — F320 Major depressive disorder, single episode, mild: Secondary | ICD-10-CM

## 2023-12-10 DIAGNOSIS — E119 Type 2 diabetes mellitus without complications: Secondary | ICD-10-CM

## 2023-12-12 ENCOUNTER — Other Ambulatory Visit: Payer: Self-pay | Admitting: Physician Assistant

## 2023-12-12 DIAGNOSIS — G301 Alzheimer's disease with late onset: Secondary | ICD-10-CM

## 2023-12-12 DIAGNOSIS — F320 Major depressive disorder, single episode, mild: Secondary | ICD-10-CM

## 2023-12-18 ENCOUNTER — Other Ambulatory Visit

## 2023-12-18 DIAGNOSIS — R899 Unspecified abnormal finding in specimens from other organs, systems and tissues: Secondary | ICD-10-CM

## 2023-12-19 ENCOUNTER — Other Ambulatory Visit: Payer: Self-pay | Admitting: Physician Assistant

## 2023-12-19 ENCOUNTER — Ambulatory Visit: Payer: Self-pay | Admitting: Physician Assistant

## 2023-12-19 DIAGNOSIS — R899 Unspecified abnormal finding in specimens from other organs, systems and tissues: Secondary | ICD-10-CM

## 2023-12-19 LAB — THYROID PANEL WITH TSH
Free Thyroxine Index: 2.4 (ref 1.2–4.9)
T3 Uptake Ratio: 30 % (ref 24–39)
T4, Total: 8 ug/dL (ref 4.5–12.0)
TSH: 0.372 u[IU]/mL — ABNORMAL LOW (ref 0.450–4.500)

## 2023-12-25 ENCOUNTER — Other Ambulatory Visit: Payer: Self-pay | Admitting: Physician Assistant

## 2023-12-25 DIAGNOSIS — M810 Age-related osteoporosis without current pathological fracture: Secondary | ICD-10-CM

## 2024-01-30 ENCOUNTER — Other Ambulatory Visit

## 2024-02-18 ENCOUNTER — Telehealth: Payer: Self-pay | Admitting: Physician Assistant

## 2024-02-18 NOTE — Telephone Encounter (Signed)
 Called and left a voicemail for patient/patient representative to call the office to reschedule the appointment for 02/26/24 due to the provider being out of the office.

## 2024-02-26 ENCOUNTER — Ambulatory Visit: Admitting: Physician Assistant

## 2024-03-02 ENCOUNTER — Ambulatory Visit (INDEPENDENT_AMBULATORY_CARE_PROVIDER_SITE_OTHER): Admitting: Physician Assistant

## 2024-03-02 ENCOUNTER — Encounter: Payer: Self-pay | Admitting: Physician Assistant

## 2024-03-02 VITALS — BP 130/70 | HR 92 | Temp 97.9°F | Resp 16 | Ht 64.0 in | Wt 107.0 lb

## 2024-03-02 DIAGNOSIS — N1832 Chronic kidney disease, stage 3b: Secondary | ICD-10-CM

## 2024-03-02 DIAGNOSIS — E119 Type 2 diabetes mellitus without complications: Secondary | ICD-10-CM | POA: Diagnosis not present

## 2024-03-02 DIAGNOSIS — F411 Generalized anxiety disorder: Secondary | ICD-10-CM

## 2024-03-02 DIAGNOSIS — R899 Unspecified abnormal finding in specimens from other organs, systems and tissues: Secondary | ICD-10-CM

## 2024-03-02 DIAGNOSIS — E782 Mixed hyperlipidemia: Secondary | ICD-10-CM

## 2024-03-02 DIAGNOSIS — M810 Age-related osteoporosis without current pathological fracture: Secondary | ICD-10-CM

## 2024-03-02 DIAGNOSIS — E559 Vitamin D deficiency, unspecified: Secondary | ICD-10-CM

## 2024-03-02 DIAGNOSIS — G301 Alzheimer's disease with late onset: Secondary | ICD-10-CM

## 2024-03-02 DIAGNOSIS — F0280 Dementia in other diseases classified elsewhere without behavioral disturbance: Secondary | ICD-10-CM

## 2024-03-02 NOTE — Progress Notes (Signed)
 Established Patient Office Visit  Subjective:  Patient ID: Donna Moran, female    DOB: December 12, 1955  Age: 68 y.o. MRN: [id number]  CC:  Chief Complaint  Patient presents with   Medical Management of Chronic Issues   Diabetes    HP  Pt with NIDDM - has had for several years - unsure exactly.  States she is checking her glucose and it has been within fasting of 90-110 and highest reading after eating around 170 -  She is taking trulicity  3mg  weekly and jardiance  10mg  qd -  She is overdue for eye appt and will schedule Is due for labwork  Pt with mild depression and anxiety as well as early onset of dementia.  She is currently on citalopram  10mg  , desyrel  100mg  qhs and aricept  10mg  Daughter states she has been stable on these medications  Pt with GERD - stable on omeprazole  20mg  qd   Pt with known chronic kidney disease - has been seeing nephrologist  Pt with osteoporosis - currently on fosamax  weekly Will check vit D level  Pt with elevated lipids - currently not on medication (has deferred) - will recheck labwork   Pt had abnormal iron studies at last visit - she was supposed to do hemoccult cards but has not completed yet - will repeat labwork today  Past Medical History:  Diagnosis Date   Diabetes mellitus without complication (HCC)    History of blood transfusion    Hyperlipidemia     Past Surgical History:  Procedure Laterality Date   CATARACT EXTRACTION Bilateral 04/2013-05/2013    Family History  Problem Relation Age of Onset   Cancer Neg Hx    Diabetes Neg Hx    Stroke Neg Hx    Breast cancer Neg Hx     Social History   Socioeconomic History   Marital status: Married    Spouse name: Not on file   Number of children: 2   Years of education: Not on file   Highest education level: Not on file  Occupational History   Occupation: Unemployed    Employer: NOT EMPLOYED  Tobacco Use   Smoking status: Former    Current packs/day: 0.00    Types:  Cigarettes    Quit date: 2012    Years since quitting: 13.8   Smokeless tobacco: Never  Substance and Sexual Activity   Alcohol use: No   Drug use: No   Sexual activity: Yes  Other Topics Concern   Not on file  Social History Narrative   Regular exercise-no   Caffeine Use-yes   Social Drivers of Health   Financial Resource Strain: Low Risk  (06/18/2023)   Overall Financial Resource Strain (CARDIA)    Difficulty of Paying Living Expenses: Not hard at all  Food Insecurity: No Food Insecurity (06/18/2023)   Hunger Vital Sign    Worried About Running Out of Food in the Last Year: Never true    Ran Out of Food in the Last Year: Never true  Transportation Needs: No Transportation Needs (06/20/2020)   Received from [facility]   PRAPARE - Transportation    In the past 12 months, has lack of transportation kept you from medical appointments or from getting medications?: No    Lack of Transportation (Non-Medical): No  Physical Activity: Inactive (06/18/2023)   Exercise Vital Sign    Days of Exercise per Week: 0 days    Minutes of Exercise per Session: 0 min  Stress: No Stress Concern  Present (06/18/2023)   [employer] of Occupational Health - Occupational Stress Questionnaire    Feeling of Stress : Not at all  Social Connections: Moderately Isolated (06/18/2023)   Social Connection and Isolation Panel    Frequency of Communication with Friends and Family: More than three times a week    Frequency of Social Gatherings with Friends and Family: Twice a week    Attends Religious Services: Never    Database Administrator or Organizations: No    Attends Banker Meetings: Never    Marital Status: Married  Catering Manager Violence: Not At Risk (06/18/2023)   Humiliation, Afraid, Rape, and Kick questionnaire    Fear of Current or Ex-Partner: No    Emotionally Abused: No    Physically Abused: No    Sexually Abused: No     Current Outpatient Medications:     alendronate  (FOSAMAX ) 70 MG tablet, TAKE 1 TABLET(70 MG) BY MOUTH 1 TIME A WEEK WITH A FULL GLASS OF WATER AND ON AN EMPTY STOMACH. DO NOT LIE DOWN FOR 1 HOUR, Disp: 4 tablet, Rfl: 5   citalopram  (CELEXA ) 10 MG tablet, TAKE 1 TABLET(10 MG) BY MOUTH DAILY, Disp: 90 tablet, Rfl: 0   diclofenac Sodium (VOLTAREN) 1 % GEL, Apply topically 4 (four) times daily., Disp: , Rfl:    donepezil  (ARICEPT ) 10 MG tablet, TAKE 1 TABLET(10 MG) BY MOUTH AT BEDTIME, Disp: 90 tablet, Rfl: 0   empagliflozin  (JARDIANCE ) 10 MG TABS tablet, TAKE 1 TABLET(10 MG) BY MOUTH DAILY BEFORE BREAKFAST, Disp: 30 tablet, Rfl: 2   lidocaine  4 %, Place 1 patch onto the skin as needed., Disp: , Rfl:    omeprazole  (PRILOSEC) 20 MG capsule, Take 20 mg by mouth daily., Disp: , Rfl:    traZODone  (DESYREL ) 100 MG tablet, TAKE 1 TABLET(100 MG) BY MOUTH AT BEDTIME, Disp: 90 tablet, Rfl: 0   TRULICITY  3 MG/0.5ML SOAJ, ADMINISTER 3 MG UNDER THE SKIN 1 TIME A WEEK AS DIRECTED, Disp: 2 mL, Rfl: 2   Allergies  Allergen Reactions   Oxycodone Nausea Only   Aspirin Nausea Only    Can take coated aspirin   CONSTITUTIONAL: Negative for chills, fatigue, fever, E/N/T: Negative for ear pain, nasal congestion and sore throat.  CARDIOVASCULAR: Negative for chest pain, dizziness, palpitations and pedal edema.  RESPIRATORY: Negative for recent cough and dyspnea.  GASTROINTESTINAL: Negative for abdominal pain, acid reflux symptoms, constipation, diarrhea, nausea and vomiting.  INTEGUMENTARY: Negative for rash.  NEUROLOGICAL: Negative for dizziness and headaches.  PSYCHIATRIC: Negative for sleep disturbance and to question depression screen.  Negative for depression, negative for anhedonia.        Objective:   PHYSICAL EXAM:   VS: BP 130/70   Pulse 92   Temp 97.9 F (36.6 C)   Resp 16   Ht 5' 4 (1.626 m)   Wt 107 lb (48.5 kg)   SpO2 95%   BMI 18.37 kg/m   GEN: Well nourished, well developed, in no acute distress  Cardiac: RRR; no  murmurs, rubs, or gallops,no edema - Respiratory:  normal respiratory rate and pattern with no distress - normal breath sounds with no rales, rhonchi, wheezes or rubs MS: no deformity or atrophy  Skin: warm and dry, no rash  Neuro:  Alert and Oriented x 3,- CN II-Xii grossly intact Psych: euthymic mood, appropriate affect and demeanor Diabetic Foot Exam - Simple   Simple Foot Form Diabetic Foot exam was performed with the following findings: Yes 03/02/2024  10:37 AM  Visual Inspection No deformities, no ulcerations, no other skin breakdown bilaterally: Yes Sensation Testing Intact to touch and monofilament testing bilaterally: Yes Pulse Check Posterior Tibialis and Dorsalis pulse intact bilaterally: Yes Comments      BP 130/70   Pulse 92   Temp 97.9 F (36.6 C)   Resp 16   Ht 5' 4 (1.626 m)   Wt 107 lb (48.5 kg)   SpO2 95%   BMI 18.37 kg/m  Wt Readings from Last 3 Encounters:  03/02/24 107 lb (48.5 kg)  11/26/23 102 lb 6.4 oz (46.4 kg)  10/23/23 99 lb 12.8 oz (45.3 kg)     There are no preventive care reminders to display for this patient.   There are no preventive care reminders to display for this patient.  Lab Results  Component Value Date   TSH 0.372 (L) 12/18/2023   Lab Results  Component Value Date   WBC 5.8 11/26/2023   HGB 10.9 (L) 11/26/2023   HCT 34.7 11/26/2023   MCV 95 11/26/2023   PLT 213 11/26/2023   Lab Results  Component Value Date   NA 140 11/26/2023   K 4.7 11/26/2023   CO2 23 11/26/2023   GLUCOSE 120 (H) 11/26/2023   BUN 33 (H) 11/26/2023   CREATININE 1.24 (H) 11/26/2023   BILITOT 0.4 11/26/2023   ALKPHOS 44 11/26/2023   AST 20 11/26/2023   ALT 14 11/26/2023   PROT 6.5 11/26/2023   ALBUMIN 4.4 11/26/2023   CALCIUM  9.5 11/26/2023   EGFR 47 (L) 11/26/2023   GFR 71.18 01/26/2015   Lab Results  Component Value Date   CHOL 228 (H) 11/26/2023   Lab Results  Component Value Date   HDL 81 11/26/2023   Lab Results  Component  Value Date   LDLCALC 138 (H) 11/26/2023   Lab Results  Component Value Date   TRIG 55 11/26/2023   Lab Results  Component Value Date   CHOLHDL 2.8 11/26/2023   Lab Results  Component Value Date   HGBA1C 6.4 (H) 11/26/2023      Assessment & Plan:   Problem List Items Addressed This Visit       Other   Generalized anxiety disorder   Relevant Medications   citalopram  (CELEXA ) 10 MG tablet   traZODone  (DESYREL ) 100 MG tablet   Other Relevant Orders   TSH    Other Visit Diagnoses     Controlled type 2 diabetes without long term use of insulin    Relevant Medications   Dulaglutide  (TRULICITY ) 1.5 MG/0.5ML SOPN   Other Relevant Orders   CBC with Differential/Platelet    Comprehensive metabolic panel    Hemoglobin A1c    TSH    Mild Late onset Alzheimer's dementia with behavioral disturbance (HCC)       Relevant Medications   citalopram  (CELEXA ) 10 MG tablet   donepezil  (ARICEPT ) 10 MG tablet   traZODone  (DESYREL ) 100 MG tablet   Other Relevant Orders   CBC with Differential/Platelet    Comprehensive metabolic panel    TSH       Age related osteoporosis without pathological fracture Continue fosamax       Chronic kidney disease stage 3b(HCC) Follow up with neprhology as scheduled      Mixed hyperlipidemia Labwork pending      Vit D def Labwork pending  Abnormal labs Microcytic anemia panel  No orders of the defined types were placed in this encounter.   Follow-up: Return in about 4 months (around 06/30/2024) for chronic fasting follow-up.    SARA R Shaunice Levitan, PA-C

## 2024-03-03 ENCOUNTER — Other Ambulatory Visit: Payer: Self-pay | Admitting: Physician Assistant

## 2024-03-03 ENCOUNTER — Ambulatory Visit: Payer: Self-pay | Admitting: Physician Assistant

## 2024-03-03 DIAGNOSIS — E119 Type 2 diabetes mellitus without complications: Secondary | ICD-10-CM

## 2024-03-03 LAB — FE+CBC/D/PLT+TIBC+FER+RETIC
Basophils Absolute: 0 x10E3/uL (ref 0.0–0.2)
Basos: 1 %
EOS (ABSOLUTE): 0.1 x10E3/uL (ref 0.0–0.4)
Eos: 1 %
Ferritin: 110 ng/mL (ref 15–150)
Hematocrit: 32.8 % — ABNORMAL LOW (ref 34.0–46.6)
Hemoglobin: 10.5 g/dL — ABNORMAL LOW (ref 11.1–15.9)
Immature Grans (Abs): 0 x10E3/uL (ref 0.0–0.1)
Immature Granulocytes: 0 %
Iron Saturation: 30 % (ref 15–55)
Iron: 83 ug/dL (ref 27–139)
Lymphocytes Absolute: 1.2 x10E3/uL (ref 0.7–3.1)
Lymphs: 21 %
MCH: 30.8 pg (ref 26.6–33.0)
MCHC: 32 g/dL (ref 31.5–35.7)
MCV: 96 fL (ref 79–97)
Monocytes Absolute: 0.4 x10E3/uL (ref 0.1–0.9)
Monocytes: 7 %
Neutrophils Absolute: 4 x10E3/uL (ref 1.4–7.0)
Neutrophils: 70 %
Platelets: 196 x10E3/uL (ref 150–450)
RBC: 3.41 x10E6/uL — ABNORMAL LOW (ref 3.77–5.28)
RDW: 12 % (ref 11.7–15.4)
Retic Ct Pct: 1.2 % (ref 0.6–2.6)
Total Iron Binding Capacity: 281 ug/dL (ref 250–450)
UIBC: 198 ug/dL (ref 118–369)
WBC: 5.8 x10E3/uL (ref 3.4–10.8)

## 2024-03-03 LAB — COMPREHENSIVE METABOLIC PANEL WITH GFR
ALT: 13 IU/L (ref 0–32)
AST: 18 IU/L (ref 0–40)
Albumin: 4.4 g/dL (ref 3.9–4.9)
Alkaline Phosphatase: 49 IU/L (ref 49–135)
BUN/Creatinine Ratio: 24 (ref 12–28)
BUN: 33 mg/dL — ABNORMAL HIGH (ref 8–27)
Bilirubin Total: 0.4 mg/dL (ref 0.0–1.2)
CO2: 23 mmol/L (ref 20–29)
Calcium: 9.9 mg/dL (ref 8.7–10.3)
Chloride: 106 mmol/L (ref 96–106)
Creatinine, Ser: 1.4 mg/dL — ABNORMAL HIGH (ref 0.57–1.00)
Globulin, Total: 2.3 g/dL (ref 1.5–4.5)
Glucose: 123 mg/dL — ABNORMAL HIGH (ref 70–99)
Potassium: 4.5 mmol/L (ref 3.5–5.2)
Sodium: 141 mmol/L (ref 134–144)
Total Protein: 6.7 g/dL (ref 6.0–8.5)
eGFR: 40 mL/min/1.73 — ABNORMAL LOW (ref >59)

## 2024-03-03 LAB — VITAMIN D 25 HYDROXY (VIT D DEFICIENCY, FRACTURES): Vit D, 25-Hydroxy: 50.5 ng/mL (ref 30.0–100.0)

## 2024-03-03 LAB — THYROID PANEL WITH TSH
Free Thyroxine Index: 2.1 (ref 1.2–4.9)
T3 Uptake Ratio: 30 % (ref 24–39)
T4, Total: 7 ug/dL (ref 4.5–12.0)
TSH: 0.434 u[IU]/mL — ABNORMAL LOW (ref 0.450–4.500)

## 2024-03-03 LAB — HEMOGLOBIN A1C
Est. average glucose Bld gHb Est-mCnc: 140 mg/dL
Hgb A1c MFr Bld: 6.5 % — ABNORMAL HIGH (ref 4.8–5.6)

## 2024-03-03 LAB — LIPID PANEL
Chol/HDL Ratio: 2.6 ratio (ref 0.0–4.4)
Cholesterol, Total: 236 mg/dL — ABNORMAL HIGH (ref 100–199)
HDL: 90 mg/dL (ref >39)
LDL Chol Calc (NIH): 132 mg/dL — ABNORMAL HIGH (ref 0–99)
Triglycerides: 84 mg/dL (ref 0–149)
VLDL Cholesterol Cal: 14 mg/dL (ref 5–40)

## 2024-03-04 ENCOUNTER — Other Ambulatory Visit: Payer: Self-pay | Admitting: Physician Assistant

## 2024-03-04 DIAGNOSIS — E782 Mixed hyperlipidemia: Secondary | ICD-10-CM

## 2024-03-04 MED ORDER — ATORVASTATIN CALCIUM 10 MG PO TABS: 10.0000 mg | ORAL_TABLET | Freq: Every day | ORAL | 1 refills | Status: AC

## 2024-03-09 ENCOUNTER — Other Ambulatory Visit: Payer: Self-pay | Admitting: Physician Assistant

## 2024-03-09 DIAGNOSIS — F320 Major depressive disorder, single episode, mild: Secondary | ICD-10-CM

## 2024-03-14 ENCOUNTER — Other Ambulatory Visit: Payer: Self-pay | Admitting: Physician Assistant

## 2024-03-14 DIAGNOSIS — G301 Alzheimer's disease with late onset: Secondary | ICD-10-CM

## 2024-03-14 DIAGNOSIS — F320 Major depressive disorder, single episode, mild: Secondary | ICD-10-CM

## 2024-05-21 ENCOUNTER — Other Ambulatory Visit: Payer: Self-pay | Admitting: Physician Assistant

## 2024-05-21 DIAGNOSIS — E119 Type 2 diabetes mellitus without complications: Secondary | ICD-10-CM

## 2024-07-02 ENCOUNTER — Ambulatory Visit: Admitting: Physician Assistant
